# Patient Record
Sex: Female | Born: 1955
Health system: Southern US, Community
[De-identification: ages and names within clinical notes are randomized; demographics above are authoritative.]

## PROBLEM LIST (undated history)

## (undated) DIAGNOSIS — I10 Essential (primary) hypertension: Secondary | ICD-10-CM

## (undated) DIAGNOSIS — IMO0001 Reserved for inherently not codable concepts without codable children: Secondary | ICD-10-CM

## (undated) DIAGNOSIS — G8929 Other chronic pain: Secondary | ICD-10-CM

## (undated) DIAGNOSIS — Z464 Encounter for fitting and adjustment of orthodontic device: Secondary | ICD-10-CM

## (undated) DIAGNOSIS — M1990 Unspecified osteoarthritis, unspecified site: Secondary | ICD-10-CM

## (undated) DIAGNOSIS — E785 Hyperlipidemia, unspecified: Secondary | ICD-10-CM

## (undated) HISTORY — PX: VAGINAL HYSTERECTOMY: SUR661

## (undated) HISTORY — DX: Essential (primary) hypertension: I10

## (undated) HISTORY — DX: Hyperlipidemia, unspecified: E78.5

## (undated) HISTORY — PX: APPENDECTOMY: SHX54

---

## 1998-01-22 DIAGNOSIS — T7840XA Allergy, unspecified, initial encounter: Secondary | ICD-10-CM

## 1998-01-22 HISTORY — DX: Allergy, unspecified, initial encounter: T78.40XA

## 2004-04-16 ENCOUNTER — Ambulatory Visit: Payer: Self-pay | Admitting: Obstetrics and Gynecology

## 2005-05-20 ENCOUNTER — Ambulatory Visit: Payer: Self-pay | Admitting: Obstetrics and Gynecology

## 2006-05-15 ENCOUNTER — Ambulatory Visit: Payer: Self-pay | Admitting: Obstetrics and Gynecology

## 2007-04-01 ENCOUNTER — Ambulatory Visit: Payer: Self-pay | Admitting: Gastroenterology

## 2007-05-21 ENCOUNTER — Ambulatory Visit: Payer: Self-pay | Admitting: Unknown Physician Specialty

## 2008-02-25 ENCOUNTER — Ambulatory Visit: Payer: Self-pay | Admitting: Family Medicine

## 2008-07-04 ENCOUNTER — Ambulatory Visit: Payer: Self-pay | Admitting: Unknown Physician Specialty

## 2009-07-10 ENCOUNTER — Ambulatory Visit: Payer: Self-pay | Admitting: Unknown Physician Specialty

## 2010-10-22 ENCOUNTER — Ambulatory Visit: Payer: Self-pay | Admitting: Anesthesiology

## 2010-10-22 DIAGNOSIS — Z01810 Encounter for preprocedural cardiovascular examination: Secondary | ICD-10-CM

## 2010-10-25 ENCOUNTER — Ambulatory Visit: Payer: Self-pay | Admitting: Otolaryngology

## 2011-06-05 ENCOUNTER — Ambulatory Visit: Payer: Self-pay | Admitting: Family Medicine

## 2011-10-03 ENCOUNTER — Ambulatory Visit: Payer: Self-pay | Admitting: Otolaryngology

## 2012-07-20 ENCOUNTER — Ambulatory Visit: Payer: Self-pay | Admitting: Family Medicine

## 2013-07-28 ENCOUNTER — Ambulatory Visit: Payer: Self-pay | Admitting: Family Medicine

## 2013-08-21 HISTORY — PX: COLONOSCOPY: SHX174

## 2013-09-17 ENCOUNTER — Ambulatory Visit: Payer: Self-pay | Admitting: Gastroenterology

## 2013-09-17 LAB — HM COLONOSCOPY

## 2014-11-15 ENCOUNTER — Other Ambulatory Visit: Payer: Self-pay | Admitting: Family Medicine

## 2014-12-09 ENCOUNTER — Encounter: Payer: Self-pay | Admitting: Family Medicine

## 2014-12-09 ENCOUNTER — Ambulatory Visit (INDEPENDENT_AMBULATORY_CARE_PROVIDER_SITE_OTHER): Payer: BLUE CROSS/BLUE SHIELD | Admitting: Family Medicine

## 2014-12-09 VITALS — BP 124/80 | HR 64 | Temp 98.4°F | Ht 69.0 in | Wt 259.0 lb

## 2014-12-09 DIAGNOSIS — J0100 Acute maxillary sinusitis, unspecified: Secondary | ICD-10-CM

## 2014-12-09 MED ORDER — AZITHROMYCIN 250 MG PO TABS: ORAL_TABLET | ORAL | Status: DC

## 2014-12-09 NOTE — Progress Notes (Signed)
Name: Katrina Crawford   MRN: [id number]    DOB: 03/19/1955   Date:12/09/2014       Progress Note  Subjective  Chief Complaint  Chief Complaint  Patient presents with  . Sinusitis    sore throat, cough with Rudge production    Sinusitis This is a new problem. The current episode started in the past 7 days. The problem has been gradually worsening since onset. There has been no fever. The pain is mild. Associated symptoms include congestion, coughing, ear pain and sinus pressure. Pertinent negatives include no chills, diaphoresis, headaches, neck pain, shortness of breath, sneezing, sore throat or swollen glands. Past treatments include acetaminophen and oral decongestants. The treatment provided mild relief.    No problem-specific assessment & plan notes found for this encounter.   Past Medical History  Diagnosis Date  . Hypertension   . Hyperlipidemia     Past Surgical History  Procedure Laterality Date  . Appendectomy    . Vaginal hysterectomy    . Colonoscopy  08/2013    cleared for 5 yrs- Dr Servando SnareWohl    History reviewed. No pertinent family history.  Social History   Social History  . Marital Status: Married    Spouse Name: N/A  . Number of Children: N/A  . Years of Education: N/A   Occupational History  . Not on file.   Social History Main Topics  . Smoking status: Never Smoker   . Smokeless tobacco: Not on file  . Alcohol Use: No  . Drug Use: No  . Sexual Activity: Yes   Other Topics Concern  . Not on file   Social History Narrative  . No narrative on file    Allergies  Allergen Reactions  . Penicillins   . Sulfa Antibiotics      Review of Systems  Constitutional: Negative for fever, chills, weight loss, malaise/fatigue and diaphoresis.  HENT: Positive for congestion, ear pain and sinus pressure. Negative for ear discharge, sneezing and sore throat.   Eyes: Negative for blurred vision.  Respiratory: Positive for cough. Negative for sputum  production, shortness of breath and wheezing.   Cardiovascular: Negative for chest pain, palpitations and leg swelling.  Gastrointestinal: Negative for heartburn, nausea, abdominal pain, diarrhea, constipation, blood in stool and melena.  Genitourinary: Negative for dysuria, urgency, frequency and hematuria.  Musculoskeletal: Negative for myalgias, back pain, joint pain and neck pain.  Skin: Negative for rash.  Neurological: Negative for dizziness, tingling, sensory change, focal weakness and headaches.  Endo/Heme/Allergies: Negative for environmental allergies and polydipsia. Does not bruise/bleed easily.  Psychiatric/Behavioral: Negative for depression and suicidal ideas. The patient is not nervous/anxious and does not have insomnia.      Objective  Filed Vitals:   12/09/14 1336  BP: 124/80  Pulse: 64  Temp: 98.4 F (36.9 C)  TempSrc: Oral  Height: 5\' 9"  (1.753 m)  Weight: 259 lb (117.482 kg)    Physical Exam  Constitutional: She is well-developed, well-nourished, and in no distress. No distress.  HENT:  Head: Normocephalic and atraumatic.  Right Ear: External ear normal.  Left Ear: External ear normal.  Nose: Nose normal.  Mouth/Throat: Oropharynx is clear and moist.  Eyes: Conjunctivae and EOM are normal. Pupils are equal, round, and reactive to light. Right eye exhibits no discharge. Left eye exhibits no discharge.  Neck: Normal range of motion. Neck supple. No JVD present. No thyromegaly present.  Cardiovascular: Normal rate, regular rhythm, normal heart sounds and intact distal pulses.  Exam  reveals no gallop and no friction rub.   No murmur heard. Pulmonary/Chest: Effort normal and breath sounds normal.  Abdominal: Soft. Bowel sounds are normal. She exhibits no mass. There is no tenderness. There is no guarding.  Musculoskeletal: Normal range of motion. She exhibits no edema.  Lymphadenopathy:    She has no cervical adenopathy.  Neurological: She is alert. She has  normal reflexes.  Skin: Skin is warm and dry. She is not diaphoretic.  Psychiatric: Mood and affect normal.      Assessment & Plan  Problem List Items Addressed This Visit    None    Visit Diagnoses    Acute maxillary sinusitis, recurrence not specified    -  Primary    Relevant Medications    azithromycin (ZITHROMAX) 250 MG tablet         Dr. Hayden [facility]  12/09/2014

## 2014-12-23 ENCOUNTER — Ambulatory Visit (INDEPENDENT_AMBULATORY_CARE_PROVIDER_SITE_OTHER): Payer: BLUE CROSS/BLUE SHIELD | Admitting: Family Medicine

## 2014-12-23 ENCOUNTER — Encounter: Payer: Self-pay | Admitting: Family Medicine

## 2014-12-23 ENCOUNTER — Other Ambulatory Visit: Payer: Self-pay | Admitting: Family Medicine

## 2014-12-23 VITALS — BP 139/86 | HR 86 | Temp 98.6°F | Resp 16 | Ht 69.0 in | Wt 263.0 lb

## 2014-12-23 DIAGNOSIS — R350 Frequency of micturition: Secondary | ICD-10-CM

## 2014-12-23 DIAGNOSIS — N3001 Acute cystitis with hematuria: Secondary | ICD-10-CM | POA: Diagnosis not present

## 2014-12-23 LAB — POCT URINALYSIS DIPSTICK
Bilirubin, UA: NEGATIVE
GLUCOSE UA: NEGATIVE
Ketones, UA: NEGATIVE
NITRITE UA: POSITIVE
Spec Grav, UA: 1.01
UROBILINOGEN UA: 0.2
pH, UA: 6

## 2014-12-23 MED ORDER — PHENAZOPYRIDINE HCL 100 MG PO TABS: 100.0000 mg | ORAL_TABLET | Freq: Three times a day (TID) | ORAL | Status: DC | PRN

## 2014-12-23 MED ORDER — NITROFURANTOIN MONOHYD MACRO 100 MG PO CAPS: 100.0000 mg | ORAL_CAPSULE | Freq: Two times a day (BID) | ORAL | Status: DC

## 2014-12-23 NOTE — Patient Instructions (Addendum)
You are being treated for a urinary tract infection today. Please take your antibiotic as directed. If you develop severe flank pain, blood in the urine, fever, nausea or vomiting, please seek immediate medical attention in the ER.    Urinary Tract Infection Urinary tract infections (UTIs) can develop anywhere along your urinary tract. Your urinary tract is your body's drainage system for removing wastes and extra water. Your urinary tract includes two kidneys, two ureters, a bladder, and a urethra. Your kidneys are a pair of bean-shaped organs. Each kidney is about the size of your fist. They are located below your ribs, one on each side of your spine. CAUSES Infections are caused by microbes, which are microscopic organisms, including fungi, viruses, and bacteria. These organisms are so small that they can only be seen through a microscope. Bacteria are the microbes that most commonly cause UTIs. SYMPTOMS  Symptoms of UTIs may vary by age and gender of the patient and by the location of the infection. Symptoms in young women typically include a frequent and intense urge to urinate and a painful, burning feeling in the bladder or urethra during urination. Older women and men are more likely to be tired, shaky, and weak and have muscle aches and abdominal pain. A fever may mean the infection is in your kidneys. Other symptoms of a kidney infection include pain in your back or sides below the ribs, nausea, and vomiting. DIAGNOSIS To diagnose a UTI, your caregiver will ask you about your symptoms. Your caregiver will also ask you to provide a urine sample. The urine sample will be tested for bacteria and Hollenbach blood cells. Brendel blood cells are made by your body to help fight infection. TREATMENT  Typically, UTIs can be treated with medication. Because most UTIs are caused by a bacterial infection, they usually can be treated with the use of antibiotics. The choice of antibiotic and length of treatment  depend on your symptoms and the type of bacteria causing your infection. HOME CARE INSTRUCTIONS  If you were prescribed antibiotics, take them exactly as your caregiver instructs you. Finish the medication even if you feel better after you have only taken some of the medication.  Drink enough water and fluids to keep your urine clear or pale yellow.  Avoid caffeine, tea, and carbonated beverages. They tend to irritate your bladder.  Empty your bladder often. Avoid holding urine for long periods of time.  Empty your bladder before and after sexual intercourse.  After a bowel movement, women should cleanse from front to back. Use each tissue only once. SEEK MEDICAL CARE IF:   You have back pain.  You develop a fever.  Your symptoms do not begin to resolve within 3 days. SEEK IMMEDIATE MEDICAL CARE IF:   You have severe back pain or lower abdominal pain.  You develop chills.  You have nausea or vomiting.  You have continued burning or discomfort with urination. MAKE SURE YOU:   Understand these instructions.  Will watch your condition.  Will get help right away if you are not doing well or get worse.   This information is not intended to replace advice given to you by your health care provider. Make sure you discuss any questions you have with your health care provider.   Document Released: 10/17/2004 Document Revised: 09/28/2014 Document Reviewed: 02/15/2011 Elsevier Interactive Patient Education 2016 [employer]  

## 2014-12-23 NOTE — Progress Notes (Signed)
Subjective:    Patient ID: Katrina Crawford, female    DOB: 1955/06/13, 59 y.o.   MRN: [id number]  HPI: Katrina Crawford is a 59 y.o. female presenting on 12/23/2014 for Urinary Tract Infection   Urinary Tract Infection  This is a new problem. The current episode started yesterday. The problem occurs every urination. The problem has been unchanged. The quality of the pain is described as burning. The pain is mild. There has been no fever. There is no history of pyelonephritis. Associated symptoms include frequency and hesitancy. Pertinent negatives include no chills, discharge, flank pain, hematuria, nausea or possible pregnancy. She has tried nothing for the symptoms.    Pt presents for possible UTI. Symptoms began on Wednesday afternoon. Urgency. Sharp pain at end of void. Small amounts of urine. No fevers. No nausea or vomiting. No OTC medications tried.   Past Medical History  Diagnosis Date  . Hypertension   . Hyperlipidemia    Social History   Social History  . Marital Status: Married    Spouse Name: N/A  . Number of Children: N/A  . Years of Education: N/A   Occupational History  . Not on file.   Social History Main Topics  . Smoking status: Never Smoker   . Smokeless tobacco: Not on file  . Alcohol Use: No  . Drug Use: No  . Sexual Activity: Yes   Other Topics Concern  . Not on file   Social History Narrative  . No narrative on file   No family history on file. Current Outpatient Prescriptions on File Prior to Visit  Medication Sig  . hydrochlorothiazide (HYDRODIURIL) 25 MG tablet TAKE 1 TABLET DAILY  . simvastatin (ZOCOR) 40 MG tablet Take 1 tablet by mouth daily.   No current facility-administered medications on file prior to visit.    Review of Systems  Constitutional: Negative for fever and chills.  Respiratory: Negative for chest tightness and shortness of breath.   Cardiovascular: Negative for chest pain, palpitations and leg swelling.    Gastrointestinal: Negative for nausea.  Genitourinary: Positive for hesitancy and frequency. Negative for hematuria and flank pain.   Per HPI unless specifically indicated above     Objective:    BP 139/86 mmHg  Pulse 86  Temp(Src) 98.6 F (37 C) (Oral)  Resp 16  Ht  (1.753 m)  Wt 263 lb (119.296 kg)  BMI 38.82 kg/m2  Wt Readings from Last 3 Encounters:  12/23/14 263 lb (119.296 kg)  12/09/14 259 lb (117.482 kg)    Physical Exam  Constitutional: She appears well-developed and well-nourished. No distress.  HENT:  Head: Normocephalic and atraumatic.  Cardiovascular: Normal rate and regular rhythm.  Exam reveals no gallop and no friction rub.   No murmur heard. Pulmonary/Chest: Effort normal and breath sounds normal. She has no wheezes.  Abdominal: Soft. Normal appearance. There is no tenderness. There is no CVA tenderness.  Skin: She is not diaphoretic.   Results for orders placed or performed in visit on 12/23/14  POCT Urinalysis Dipstick  Result Value Ref Range   Color, UA YELLOW    Clarity, UA CLOUDY    Glucose, UA NEG    Bilirubin, UA NEG    Ketones, UA NEG    Spec Grav, UA 1.010    Blood, UA 3+    pH, UA 6.0    Protein, UA TRACE    Urobilinogen, UA 0.2    Nitrite, UA POS    Leukocytes, UA  moderate (2+) (A) Negative      Assessment & Plan:   Problem List Items Addressed This Visit    None    Visit Diagnoses    Acute cystitis with hematuria    -  Primary    UA +leuks, blood, nitrites. Treat with macrobid BID x7 days. Pyridum PRN. Alarm symptoms reviewed. Urine sent for culture.     Relevant Medications    nitrofurantoin, macrocrystal-monohydrate, (MACROBID) 100 MG capsule    phenazopyridine (PYRIDIUM) 100 MG tablet    Other Relevant Orders    POCT Urinalysis Dipstick (Completed)    Urine Culture       Meds ordered this encounter  Medications  . Estradiol (DIVIGEL) 0.5 MG/0.5GM GEL    Sig: Place onto the skin.  Marland Kitchen. nitrofurantoin,  macrocrystal-monohydrate, (MACROBID) 100 MG capsule    Sig: Take 1 capsule (100 mg total) by mouth 2 (two) times daily.    Dispense:  14 capsule    Refill:  0    Order Specific Question:  Supervising Provider    Answer:  Janeann ForehandHAWKINS JR, JAMES H [[id number]]  . phenazopyridine (PYRIDIUM) 100 MG tablet    Sig: Take 1 tablet (100 mg total) by mouth 3 (three) times daily as needed for pain.    Dispense:  10 tablet    Refill:  0    Order Specific Question:  Supervising Provider    Answer:  Janeann ForehandHAWKINS JR, JAMES H [[id number]]      Follow up plan: Return if symptoms worsen or fail to improve.

## 2014-12-25 LAB — URINE CULTURE

## 2015-02-17 ENCOUNTER — Other Ambulatory Visit: Payer: Self-pay | Admitting: Family Medicine

## 2015-03-23 ENCOUNTER — Other Ambulatory Visit: Payer: Self-pay | Admitting: Family Medicine

## 2015-03-24 ENCOUNTER — Encounter: Payer: Self-pay | Admitting: Family Medicine

## 2015-03-24 ENCOUNTER — Ambulatory Visit (INDEPENDENT_AMBULATORY_CARE_PROVIDER_SITE_OTHER): Payer: BLUE CROSS/BLUE SHIELD | Admitting: Family Medicine

## 2015-03-24 VITALS — BP 140/88 | HR 68 | Ht 69.0 in | Wt 267.0 lb

## 2015-03-24 DIAGNOSIS — E785 Hyperlipidemia, unspecified: Secondary | ICD-10-CM

## 2015-03-24 DIAGNOSIS — Z23 Encounter for immunization: Secondary | ICD-10-CM

## 2015-03-24 DIAGNOSIS — I10 Essential (primary) hypertension: Secondary | ICD-10-CM | POA: Diagnosis not present

## 2015-03-24 MED ORDER — HYDROCHLOROTHIAZIDE 25 MG PO TABS: 25.0000 mg | ORAL_TABLET | Freq: Every day | ORAL | Status: DC

## 2015-03-24 MED ORDER — SIMVASTATIN 40 MG PO TABS: 40.0000 mg | ORAL_TABLET | Freq: Every day | ORAL | Status: DC

## 2015-03-24 NOTE — Progress Notes (Signed)
Name: Katrina Crawford   MRN: [id number]    DOB: [birth year]   Date:03/24/2015       Progress Note  Subjective  Chief Complaint  Chief Complaint  Patient presents with  . Hypertension  . Hyperlipidemia    Hypertension This is a chronic problem. The current episode started more than 1 year ago. The problem has been gradually improving since onset. The problem is controlled. Pertinent negatives include no anxiety, blurred vision, chest pain, headaches, malaise/fatigue, neck pain, orthopnea, palpitations, peripheral edema, PND, shortness of breath or sweats. There are no associated agents to hypertension. Risk factors for coronary artery disease include diabetes mellitus and dyslipidemia. Past treatments include diuretics. The current treatment provides no improvement. There are no compliance problems.  There is no history of angina, kidney disease, CAD/MI, CVA, heart failure, left ventricular hypertrophy, PVD, renovascular disease or retinopathy. There is no history of chronic renal disease or a hypertension causing med.  Hyperlipidemia This is a chronic problem. The current episode started more than 1 year ago. The problem is controlled. She has no history of chronic renal disease, diabetes, hypothyroidism, liver disease, obesity or nephrotic syndrome. Pertinent negatives include no chest pain, focal sensory loss, focal weakness, leg pain, myalgias or shortness of breath. Current antihyperlipidemic treatment includes statins. The current treatment provides no improvement of lipids. There are no compliance problems.  Risk factors for coronary artery disease include dyslipidemia, hypertension and obesity.    No problem-specific assessment & plan notes found for this encounter.   Past Medical History  Diagnosis Date  . Hypertension   . Hyperlipidemia     Past Surgical History  Procedure Laterality Date  . Appendectomy    . Vaginal hysterectomy    . Colonoscopy  08/2013    cleared for 5 yrs- Dr  Servando Snare    Family History  Problem Relation Age of Onset  . Cancer Mother   . Hypertension Father     Social History   Social History  . Marital Status: Married    Spouse Name: N/A  . Number of Children: N/A  . Years of Education: N/A   Occupational History  . Not on file.   Social History Main Topics  . Smoking status: Never Smoker   . Smokeless tobacco: Not on file  . Alcohol Use: No  . Drug Use: No  . Sexual Activity: Yes   Other Topics Concern  . Not on file   Social History Narrative    Allergies  Allergen Reactions  . Penicillins   . Sulfa Antibiotics   . Codeine Nausea Only     Review of Systems  Constitutional: Negative for fever, chills, weight loss and malaise/fatigue.  HENT: Negative for ear discharge, ear pain and sore throat.   Eyes: Negative for blurred vision.  Respiratory: Negative for cough, sputum production, shortness of breath and wheezing.   Cardiovascular: Negative for chest pain, palpitations, orthopnea, leg swelling and PND.  Gastrointestinal: Negative for heartburn, nausea, abdominal pain, diarrhea, constipation, blood in stool and melena.  Genitourinary: Negative for dysuria, urgency, frequency and hematuria.  Musculoskeletal: Negative for myalgias, back pain, joint pain and neck pain.  Skin: Negative for rash.  Neurological: Negative for dizziness, tingling, sensory change, focal weakness and headaches.  Endo/Heme/Allergies: Negative for environmental allergies and polydipsia. Does not bruise/bleed easily.  Psychiatric/Behavioral: Negative for depression and suicidal ideas. The patient is not nervous/anxious and does not have insomnia.      Objective  Filed Vitals:   03/24/15 1033  BP: 140/88  Pulse: 68  Height: 5\' 9"  (1.753 m)  Weight: 267 lb (121.11 kg)    Physical Exam  Constitutional: She is well-developed, well-nourished, and in no distress. No distress.  HENT:  Head: Normocephalic and atraumatic.  Right Ear: External  ear normal.  Left Ear: External ear normal.  Nose: Nose normal.  Mouth/Throat: Oropharynx is clear and moist.  Eyes: Conjunctivae and EOM are normal. Pupils are equal, round, and reactive to light. Right eye exhibits no discharge. Left eye exhibits no discharge.  Neck: Normal range of motion. Neck supple. No JVD present. No thyromegaly present.  Cardiovascular: Normal rate, regular rhythm, normal heart sounds and intact distal pulses.  Exam reveals no gallop and no friction rub.   No murmur heard. Pulmonary/Chest: Effort normal and breath sounds normal.  Abdominal: Soft. Bowel sounds are normal. She exhibits no mass. There is no tenderness. There is no guarding.  Musculoskeletal: Normal range of motion. She exhibits no edema.  Lymphadenopathy:    She has no cervical adenopathy.  Neurological: She is alert.  Skin: Skin is warm and dry. She is not diaphoretic.  Psychiatric: Mood and affect normal.  Nursing note and vitals reviewed.     Assessment & Plan  Problem List Items Addressed This Visit    None    Visit Diagnoses    Essential hypertension    -  Primary    Relevant Medications    aspirin 81 MG tablet    hydrochlorothiazide (HYDRODIURIL) 25 MG tablet    simvastatin (ZOCOR) 40 MG tablet    Other Relevant Orders    Renal Function Panel    Hyperlipidemia        Relevant Medications    aspirin 81 MG tablet    hydrochlorothiazide (HYDRODIURIL) 25 MG tablet    simvastatin (ZOCOR) 40 MG tablet    Other Relevant Orders    Lipid Profile    Need for Tdap vaccination        Relevant Orders    Tdap vaccine greater than or equal to 7yo IM (Completed)         Dr. Hayden Rasmusseneanna Jones [facility]  03/24/2015

## 2015-03-25 LAB — RENAL FUNCTION PANEL
ALBUMIN: 4.5 g/dL (ref 3.6–4.8)
BUN/Creatinine Ratio: 15 (ref 11–26)
BUN: 10 mg/dL (ref 8–27)
CALCIUM: 10.2 mg/dL (ref 8.7–10.3)
CO2: 23 mmol/L (ref 18–29)
CREATININE: 0.67 mg/dL (ref 0.57–1.00)
Chloride: 98 mmol/L (ref 96–106)
GFR calc Af Amer: 110 mL/min/{1.73_m2} (ref >59)
GFR, EST NON AFRICAN AMERICAN: 95 mL/min/{1.73_m2} (ref >59)
Glucose: 99 mg/dL (ref 65–99)
PHOSPHORUS: 3.5 mg/dL (ref 2.5–4.5)
POTASSIUM: 4.2 mmol/L (ref 3.5–5.2)
Sodium: 141 mmol/L (ref 134–144)

## 2015-03-25 LAB — LIPID PANEL
CHOL/HDL RATIO: 4.4 ratio (ref 0.0–4.4)
CHOLESTEROL TOTAL: 221 mg/dL — AB (ref 100–199)
HDL: 50 mg/dL (ref >39)
LDL CALC: 112 mg/dL — AB (ref 0–99)
TRIGLYCERIDES: 297 mg/dL — AB (ref 0–149)
VLDL Cholesterol Cal: 59 mg/dL — ABNORMAL HIGH (ref 5–40)

## 2015-04-21 ENCOUNTER — Encounter: Payer: Self-pay | Admitting: Family Medicine

## 2015-04-21 ENCOUNTER — Ambulatory Visit (INDEPENDENT_AMBULATORY_CARE_PROVIDER_SITE_OTHER): Payer: BLUE CROSS/BLUE SHIELD | Admitting: Family Medicine

## 2015-04-21 VITALS — BP 110/82 | HR 78 | Ht 69.0 in | Wt 257.0 lb

## 2015-04-21 DIAGNOSIS — Z1239 Encounter for other screening for malignant neoplasm of breast: Secondary | ICD-10-CM

## 2015-04-21 DIAGNOSIS — Z0000 Encounter for general adult medical examination without abnormal findings: Secondary | ICD-10-CM

## 2015-04-21 DIAGNOSIS — Z124 Encounter for screening for malignant neoplasm of cervix: Secondary | ICD-10-CM | POA: Diagnosis not present

## 2015-04-21 NOTE — Patient Instructions (Signed)
GUIDELINES FOR  LOW-CHOLESTEROL, LOW-TRIGLYCERIDE DIETS    FOODS TO USE   MEATS, FISH Choose lean meats (chicken, turkey, veal, and non-fatty cuts of beef with excess fat trimmed; one serving = 3 oz of cooked meat). Also, fresh or frozen fish, canned fish packed in water, and shellfish (lobster, crabs, shrimp, and oysters). Limit use to no more than one serving of one of these per week. Shellfish are high in cholesterol but low in saturated fat and should be used sparingly. Meats and fish should be broiled (pan or oven) or baked on a rack.  EGGS Egg substitutes and egg whites (use freely). Egg yolks (limit two per week).  FRUITS Eat three servings of fresh fruit per day (1 serving =  cup). Be sure to have at least one citrus fruit daily. Frozen and canned fruit with no sugar or syrup added may be used.  VEGETABLES Most vegetables are not limited (see next page). One dark-green (string beans, escarole) or one deep yellow (squash) vegetable is recommended daily. Cauliflower, broccoli, and celery, as well as potato skins, are recommended for their fiber content. (Fiber is associated with cholesterol reduction) It is preferable to steam vegetables, but they may be boiled, strained, or braised with polyunsaturated vegetable oil (see below).  BEANS Dried peas or beans (1 serving =  cup) may be used as a bread substitute.  NUTS Almonds, walnuts, and peanuts may be used sparingly  (1 serving = 1 Tablespoonful). Use pumpkin, sesame, or sunflower seeds.  BREADS, GRAINS One roll or one slice of whole grain or enriched bread may be used, or three soda crackers or four pieces of melba toast as a substitute. Spaghetti, rice or noodles ( cup) or  large ear of corn may be used as a bread substitute. In preparing these foods do not use butter or shortening, use soft margarine. Also use egg and sugar substitutes.  Choose high fiber grains, such as oats and whole wheat.  CEREALS Use  cup of hot cereal or  cup of  cold cereal per day. Add a sugar substitute if desired, with 99% fat free or skim milk.  MILK PRODUCTS Always use 99% fat free or skim milk, dairy products such as low fat cheeses (farmer's uncreamed diet cottage), low-fat yogurt, and powdered skim milk.  FATS, OILS Use soft (not stick) margarine; vegetable oils that are high in polyunsaturated fats (such as safflower, sunflower, soybean, corn, and cottonseed). Always refrigerate meat drippings to harden the fat and remove it before preparing gravies  DESSERTS, SNACKS Limit to two servings per day; substitute each serving for a bread/cereal serving: ice milk, water sherbet (1/4 cup); unflavored gelatin or gelatin flavored with sugar substitute (1/3 cup); pudding prepared with skim milk (1/2 cup); egg Luten souffls; unbuttered popcorn (1  cups). Substitute carob for chocolate.  BEVERAGES Fresh fruit juices (limit 4 oz per day); black coffee, plain or herbal teas; soft drinks with sugar substitutes; club soda, preferably salt-free; cocoa made with skim milk or nonfat dried milk and water (sugar substitute added if desired); clear broth. Alcohol: limit two servings per day (see second page).  MISCELLANEOUS  You may use the following freely: vinegar, spices, herbs, nonfat bouillon, mustard, Worcestershire sauce, soy sauce, flavoring essence.                  GUIDELINES FOR  LOW-CHOLESTEROL, LOW TRIGLYCERIDE DIETS    FOODS TO AVOID   MEATS, FISH Marbled beef, pork, bacon, sausage, and other pork products; fatty   fowl (duck, goose); skin and fat of turkey and chicken; processed meats; luncheon meats (salami, bologna); frankfurters and fast-food hamburgers (theyre loaded with fat); organ meats (kidneys, liver); canned fish packed in oil.  EGGS Limit egg yolks to two per week.   FRUITS Coconuts (rich in saturated fats).  VEGETABLES Avoid avocados. Starchy vegetables (potatoes, corn, lima beans, dried peas, beans) may be used only if  substitutes for a serving of bread or cereal. (Baked potato skin, however, is desirable for its fiber content.  BEANS Commercial baked beans with sugar and/or pork added.  NUTS Avoid nuts.  Limit peanuts and walnuts to one tablespoonful per day.  BREADS, GRAINS Any baked goods with shortening and/or sugar. Commercial mixes with dried eggs and whole milk. Avoid sweet rolls, doughnuts, breakfast pastries (Danish), and sweetened packaged cereals (the added sugar converts readily to triglycerides).  MILK PRODUCTS Whole milk and whole-milk packaged goods; cream; ice cream; whole-milk puddings, yogurt, or cheeses; nondairy cream substitutes.  FATS, OILS Butter, lard, animal fats, bacon drippings, gravies, cream sauces as well as palm and coconut oils. All these are high in saturated fats. Examine labels on cholesterol free products for hydrogenated fats. (These are oils that have been hardened into solids and in the process have become saturated.)  DESSERTS, SNACKS Fried snack foods like potato chips; chocolate; candies in general; jams, jellies, syrups; whole- milk puddings; ice cream and milk sherbets; hydrogenated peanut butter.  BEVERAGES Sugared fruit juices and soft drinks; cocoa made with whole milk and/or sugar. When using alcohol (1 oz liquor, 5 oz beer, or 2  oz dry table wine per serving), one serving must be substituted for one bread or cereal serving (limit, two servings of alcohol per day).   SPECIAL NOTES    1. Remember that even non-limited foods should be used in moderation. 2. While on a cholesterol-lowering diet, be sure to avoid animal fats and marbled meats. 3. 3. While on a triglyceride-lowering diet, be sure to avoid sweets and to control the amount of carbohydrates you eat (starchy foods such as flour, bread, potatoes).While on a tri-glyceride-lowering diet, be sure to avoid sweets 4. Buy a good low-fat cookbook, such as the one published by the American Heart Association. 5. Consult  your physician if you have any questions.               [facility] Low Glycemic Diet Plan   Low Glycemic Foods (20-49) Moderate Glycemic Foods (50-69) High Glycemic Foods (70-100)      Breakfast Creals Breakfast Cereals Breakfast Cereals  All Bran All-Bran Fruit'n Oats   Bran Buds Bran Chex   Cheerios Corn chex    Fiber One Oatmeal (not instant)   Just Right Mini-Wheats   Corn Flakes Cream of Wheat    Oat Bran Special K Swiss Muesli   Grape Nuts Grape Nut Flakes      Grits Nutri-Grain    Fruits and fruit juice: Fruits Puffed Rice Puffed Wheat    (Limit to 1-2 Servings per day) Banana (under-ride) Dates   Rice Chex Rice Krispies    Apples Apricots (fresh/dried)   Figs Grapes   Shredded Wheat Team    Blackberries Blueberries   Kiwi Mango   Total     Cherries Cranberries   Oranges Raisins     Peaches Pears    Fruits  Plums Prunes   Fruit Juices Pineapple Watermelon    Grapefruit Raspberries   Cranberry Juice Orange Juice   Banana (over-ripe)       Strawberries Tangerines      Apple Juice Grapefruit Juice   Beans and Legumes Beverages  Tomato Juice    Boston-type baked beans Sodas, sweet tea, pineapple juice   Canned pinto, kidney, or navy beans   Beans and Legumes (fresh-cooked) Green peas Vegetables  Black-eyed peas Butter Beans    Potato, baked, boiled, fried, mashed  Chick peas Lentils   Vegetables French fries  Green beans Lima beans   Beets Carrots   Canned or frozen corn  Kidney beans Navy beans   Sweet potato Yam   Parsnips  Pinto beans Snow peas   Corn on the cob Winter squash      Non-starchy vegetables Grains Breads  Asparagus, avocado, broccoli, cabbage Cornmeal Rice, brown   Most breads (Aziz and whole grain)  cauliflower, celery, cucumber, greens Rice, Duzan Couscous   Bagels Bread sticks    lettuce, mushrooms, peppers, tomatoes  Bread stuffing Kaiser roll    okra, onions, spinach, summer squash Pasta Dinner rolls    Macaroni Pizza, cheese     Grains Ravioli, meat filled Spaghetti, Marquard   Grains  Barley Bulgur    Rice, instant Tapioca, with milk    Rye Wild rice   Nuts    Cashews Macadamia   Candy and most cookies  Nuts and oils    Almonds, peanuts, sunflower seeds Snacks Snacks  hazelnuts, pecans, walnuts Chocolate Ice cream, lowfat   Donuts Corn chips    Oils that are liquid at room temperature Muffin Popcorn   Jelly beans Pretzels      Pastries  Dairy, fish, meat, soy, and eggs    Milk, skim Lowfat cheese    Restaurant and ethnic foods  Yogurt, lowfat, fruit sugar sweetened  Most Chinese food (sugar in stir fry    or wok sauce)  Lean red meat Fish    Teriyaki-style meats and vegetables  Skinless chicken and turkey, shellfish        Egg whites (up to 3 daily), Soy Products    Egg yolks (up to 7 or _____ per week)      

## 2015-04-21 NOTE — Progress Notes (Signed)
Name: Durwin RegesYvonne R Arakawa   MRN: [id number]    DOB: 08/22/1955   Date:04/21/2015       Progress Note  Subjective  Chief Complaint  Chief Complaint  Patient presents with  . Annual Exam    pap and breast mammo    HPI Comments: Patient presents for annual physical exam.    No problem-specific assessment & plan notes found for this encounter.   Past Medical History  Diagnosis Date  . Hypertension   . Hyperlipidemia     Past Surgical History  Procedure Laterality Date  . Appendectomy    . Vaginal hysterectomy    . Colonoscopy  08/2013    cleared for 5 yrs- Dr Servando SnareWohl    Family History  Problem Relation Age of Onset  . Cancer Mother   . Hypertension Father     Social History   Social History  . Marital Status: Married    Spouse Name: N/A  . Number of Children: N/A  . Years of Education: N/A   Occupational History  . Not on file.   Social History Main Topics  . Smoking status: Never Smoker   . Smokeless tobacco: Not on file  . Alcohol Use: No  . Drug Use: No  . Sexual Activity: Yes   Other Topics Concern  . Not on file   Social History Narrative    Allergies  Allergen Reactions  . Penicillins   . Sulfa Antibiotics   . Codeine Nausea Only     Review of Systems  Constitutional: Negative for fever, chills, weight loss and malaise/fatigue.  HENT: Negative for ear discharge, ear pain and sore throat.   Eyes: Negative for blurred vision.  Respiratory: Negative for cough, sputum production, shortness of breath and wheezing.   Cardiovascular: Negative for chest pain, palpitations and leg swelling.  Gastrointestinal: Negative for heartburn, nausea, abdominal pain, diarrhea, constipation, blood in stool and melena.  Genitourinary: Negative for dysuria, urgency, frequency and hematuria.  Musculoskeletal: Negative for myalgias, back pain, joint pain and neck pain.  Skin: Negative for rash.  Neurological: Negative for dizziness, tingling, sensory change, focal  weakness and headaches.  Endo/Heme/Allergies: Negative for environmental allergies and polydipsia. Does not bruise/bleed easily.  Psychiatric/Behavioral: Negative for depression and suicidal ideas. The patient is not nervous/anxious and does not have insomnia.      Objective  Filed Vitals:   04/21/15 1013  BP: 110/82  Pulse: 78  Height: 5\' 9"  (1.753 m)  Weight: 257 lb (116.574 kg)    Physical Exam  Constitutional: She is well-developed, well-nourished, and in no distress. No distress.  HENT:  Head: Normocephalic and atraumatic.  Right Ear: External ear normal.  Left Ear: External ear normal.  Nose: Nose normal.  Mouth/Throat: Oropharynx is clear and moist.  Eyes: Conjunctivae and EOM are normal. Pupils are equal, round, and reactive to light. Right eye exhibits no discharge. Left eye exhibits no discharge.  Neck: Normal range of motion. Neck supple. No JVD present. No thyromegaly present.  Cardiovascular: Normal rate, regular rhythm, normal heart sounds and intact distal pulses.  Exam reveals no gallop and no friction rub.   No murmur heard. Pulmonary/Chest: Effort normal and breath sounds normal. Right breast exhibits no inverted nipple, no mass, no nipple discharge, no skin change and no tenderness. Left breast exhibits no inverted nipple, no mass, no nipple discharge, no skin change and no tenderness. Breasts are symmetrical.  Abdominal: Soft. Bowel sounds are normal. She exhibits no mass. There is no tenderness.  There is no guarding.  Genitourinary: Rectum normal, vagina normal, right adnexa normal, left adnexa normal and vulva normal. Guaiac negative stool.  Musculoskeletal: Normal range of motion. She exhibits no edema.  Lymphadenopathy:    She has no cervical adenopathy.  Neurological: She is alert.  Skin: Skin is warm and dry. She is not diaphoretic.  Psychiatric: Mood and affect normal.  Nursing note and vitals reviewed.     Assessment & Plan  Problem List Items  Addressed This Visit    None    Visit Diagnoses    Annual physical exam    -  Primary    Relevant Orders    Pap IG (Image Guided)    MM Digital Screening    Cervical cancer screening        Relevant Orders    Pap IG (Image Guided)    Breast cancer screening        Relevant Orders    MM Digital Screening         Dr. Elizabeth Sauer [facility]  04/21/2015

## 2015-04-24 ENCOUNTER — Other Ambulatory Visit: Payer: Self-pay | Admitting: Family Medicine

## 2015-04-24 ENCOUNTER — Ambulatory Visit
Admission: RE | Admit: 2015-04-24 | Discharge: 2015-04-24 | Disposition: A | Discharge status: Home / Self Care | Payer: BLUE CROSS/BLUE SHIELD | Source: Ambulatory Visit | Attending: Family Medicine | Admitting: Family Medicine

## 2015-04-24 DIAGNOSIS — Z0000 Encounter for general adult medical examination without abnormal findings: Secondary | ICD-10-CM

## 2015-04-24 DIAGNOSIS — Z1231 Encounter for screening mammogram for malignant neoplasm of breast: Secondary | ICD-10-CM | POA: Insufficient documentation

## 2015-04-24 DIAGNOSIS — Z1239 Encounter for other screening for malignant neoplasm of breast: Secondary | ICD-10-CM

## 2015-04-24 DIAGNOSIS — E782 Mixed hyperlipidemia: Secondary | ICD-10-CM

## 2015-04-24 DIAGNOSIS — R928 Other abnormal and inconclusive findings on diagnostic imaging of breast: Secondary | ICD-10-CM

## 2015-04-26 LAB — PAP IG (IMAGE GUIDED): PAP Smear Comment: 0

## 2015-05-01 ENCOUNTER — Ambulatory Visit
Admission: RE | Admit: 2015-05-01 | Discharge: 2015-05-01 | Disposition: A | Discharge status: Home / Self Care | Payer: BLUE CROSS/BLUE SHIELD | Source: Ambulatory Visit | Attending: Family Medicine | Admitting: Family Medicine

## 2015-05-01 DIAGNOSIS — R928 Other abnormal and inconclusive findings on diagnostic imaging of breast: Secondary | ICD-10-CM | POA: Insufficient documentation

## 2015-05-01 DIAGNOSIS — R922 Inconclusive mammogram: Secondary | ICD-10-CM | POA: Diagnosis not present

## 2015-05-31 DIAGNOSIS — Z789 Other specified health status: Secondary | ICD-10-CM | POA: Diagnosis not present

## 2015-05-31 DIAGNOSIS — L821 Other seborrheic keratosis: Secondary | ICD-10-CM | POA: Diagnosis not present

## 2015-05-31 DIAGNOSIS — D2339 Other benign neoplasm of skin of other parts of face: Secondary | ICD-10-CM | POA: Diagnosis not present

## 2015-05-31 DIAGNOSIS — Z872 Personal history of diseases of the skin and subcutaneous tissue: Secondary | ICD-10-CM | POA: Diagnosis not present

## 2015-05-31 DIAGNOSIS — Z1283 Encounter for screening for malignant neoplasm of skin: Secondary | ICD-10-CM | POA: Diagnosis not present

## 2015-05-31 DIAGNOSIS — B078 Other viral warts: Secondary | ICD-10-CM | POA: Diagnosis not present

## 2015-09-19 ENCOUNTER — Other Ambulatory Visit: Payer: Self-pay | Admitting: Family Medicine

## 2015-09-19 DIAGNOSIS — E785 Hyperlipidemia, unspecified: Secondary | ICD-10-CM

## 2015-09-28 ENCOUNTER — Ambulatory Visit: Payer: Self-pay | Admitting: Family Medicine

## 2015-10-06 ENCOUNTER — Ambulatory Visit (INDEPENDENT_AMBULATORY_CARE_PROVIDER_SITE_OTHER): Payer: BLUE CROSS/BLUE SHIELD | Admitting: Family Medicine

## 2015-10-06 ENCOUNTER — Encounter: Payer: Self-pay | Admitting: Family Medicine

## 2015-10-06 VITALS — BP 130/80 | HR 78 | Ht 69.0 in | Wt 245.0 lb

## 2015-10-06 DIAGNOSIS — E669 Obesity, unspecified: Secondary | ICD-10-CM | POA: Diagnosis not present

## 2015-10-06 DIAGNOSIS — I10 Essential (primary) hypertension: Secondary | ICD-10-CM | POA: Diagnosis not present

## 2015-10-06 DIAGNOSIS — Z23 Encounter for immunization: Secondary | ICD-10-CM | POA: Diagnosis not present

## 2015-10-06 DIAGNOSIS — E785 Hyperlipidemia, unspecified: Secondary | ICD-10-CM | POA: Diagnosis not present

## 2015-10-06 MED ORDER — SIMVASTATIN 40 MG PO TABS: 40.0000 mg | ORAL_TABLET | Freq: Every day | ORAL | 1 refills | Status: DC

## 2015-10-06 MED ORDER — HYDROCHLOROTHIAZIDE 25 MG PO TABS: 25.0000 mg | ORAL_TABLET | Freq: Every day | ORAL | 1 refills | Status: DC

## 2015-10-06 NOTE — Progress Notes (Signed)
Name: Katrina Crawford   MRN: [id number]    DOB: 05/29/1955   Date:10/06/2015       Progress Note  Subjective  Chief Complaint  Chief Complaint  Patient presents with  . Hypertension  . Hyperlipidemia    Hypertension  This is a chronic problem. The current episode started more than 1 year ago. The problem has been gradually improving since onset. The problem is controlled. Pertinent negatives include no anxiety, blurred vision, chest pain, headaches, malaise/fatigue, neck pain, orthopnea, palpitations, peripheral edema, PND, shortness of breath or sweats. There are no associated agents to hypertension. There are no known risk factors for coronary artery disease. Past treatments include diuretics. The current treatment provides moderate improvement. There are no compliance problems.  There is no history of angina, kidney disease, CAD/MI, CVA, heart failure, left ventricular hypertrophy, PVD, renovascular disease or retinopathy. There is no history of chronic renal disease or a hypertension causing med.  Hyperlipidemia  This is a chronic problem. The current episode started more than 1 year ago. The problem is controlled. Recent lipid tests were reviewed and are normal. Exacerbating diseases include obesity. She has no history of chronic renal disease, diabetes, hypothyroidism or liver disease. There are no known factors aggravating her hyperlipidemia. Pertinent negatives include no chest pain, focal weakness, myalgias or shortness of breath. She is currently on no antihyperlipidemic treatment. The current treatment provides moderate improvement of lipids. There are no compliance problems.  There are no known risk factors for coronary artery disease.    No problem-specific Assessment & Plan notes found for this encounter.   Past Medical History:  Diagnosis Date  . Hyperlipidemia   . Hypertension     Past Surgical History:  Procedure Laterality Date  . APPENDECTOMY    . COLONOSCOPY  08/2013   cleared for 5 yrs- Dr Servando SnareWohl  . VAGINAL HYSTERECTOMY      Family History  Problem Relation Age of Onset  . Cancer Mother   . Hypertension Father     Social History   Social History  . Marital status: Married    Spouse name: N/A  . Number of children: N/A  . Years of education: N/A   Occupational History  . Not on file.   Social History Main Topics  . Smoking status: Never Smoker  . Smokeless tobacco: Not on file  . Alcohol use No  . Drug use: No  . Sexual activity: Yes   Other Topics Concern  . Not on file   Social History Narrative  . No narrative on file    Allergies  Allergen Reactions  . Penicillins   . Sulfa Antibiotics   . Codeine Nausea Only     Review of Systems  Constitutional: Negative for chills, fever, malaise/fatigue and weight loss.  HENT: Negative for ear discharge, ear pain and sore throat.   Eyes: Negative for blurred vision and redness.  Respiratory: Negative for cough, sputum production, shortness of breath and wheezing.   Cardiovascular: Negative for chest pain, palpitations, orthopnea, leg swelling and PND.  Gastrointestinal: Negative for abdominal pain, blood in stool, constipation, diarrhea, heartburn, melena and nausea.  Genitourinary: Negative for dysuria, frequency, hematuria and urgency.  Musculoskeletal: Negative for back pain, joint pain, myalgias and neck pain.  Skin: Negative for rash.  Neurological: Negative for dizziness, tingling, sensory change, focal weakness and headaches.  Endo/Heme/Allergies: Negative for environmental allergies and polydipsia. Does not bruise/bleed easily.  Psychiatric/Behavioral: Negative for depression and suicidal ideas. The patient is not  nervous/anxious and does not have insomnia.      Objective  Vitals:   10/06/15 0924  BP: 130/80  Pulse: 78  Weight: 245 lb (111.1 kg)  Height: 5\' 9"  (1.753 m)    Physical Exam  Constitutional: She is well-developed, well-nourished, and in no distress. No  distress.  HENT:  Head: Normocephalic and atraumatic.  Right Ear: External ear normal.  Left Ear: External ear normal.  Nose: Nose normal.  Mouth/Throat: Oropharynx is clear and moist.  Eyes: Conjunctivae and EOM are normal. Pupils are equal, round, and reactive to light. Right eye exhibits no discharge. Left eye exhibits no discharge.  Neck: Normal range of motion. Neck supple. No JVD present. No thyromegaly present.  Cardiovascular: Normal rate, regular rhythm, normal heart sounds and intact distal pulses.  Exam reveals no gallop and no friction rub.   No murmur heard. Pulmonary/Chest: Effort normal and breath sounds normal.  Abdominal: Soft. Bowel sounds are normal. She exhibits no mass. There is no tenderness. There is no guarding.  Musculoskeletal: Normal range of motion. She exhibits no edema.  Lymphadenopathy:    She has no cervical adenopathy.  Neurological: She is alert. She has normal reflexes.  Skin: Skin is warm and dry. She is not diaphoretic.  Psychiatric: Mood and affect normal.  Nursing note and vitals reviewed.     Assessment & Plan  Problem List Items Addressed This Visit    None    Visit Diagnoses    Flu vaccine need    -  Primary   Relevant Orders   Flu Vaccine QUAD 36+ mos PF IM (Fluarix & Fluzone Quad PF) (Completed)   Essential hypertension       Relevant Medications   hydrochlorothiazide (HYDRODIURIL) 25 MG tablet   simvastatin (ZOCOR) 40 MG tablet   Other Relevant Orders   Renal Function Panel   Hyperlipidemia       Relevant Medications   hydrochlorothiazide (HYDRODIURIL) 25 MG tablet   simvastatin (ZOCOR) 40 MG tablet   Other Relevant Orders   Lipid Profile   Hepatic function panel   Obesity            Dr. Elizabeth Sauer [facility]  10/06/15

## 2015-10-06 NOTE — Patient Instructions (Signed)
Why follow it? Research shows. . Those who follow the Mediterranean diet have a reduced risk of heart disease  . The diet is associated with a reduced incidence of Parkinson's and Alzheimer's diseases . People following the diet may have longer life expectancies and lower rates of chronic diseases  . The Dietary Guidelines for Americans recommends the Mediterranean diet as an eating plan to promote health and prevent disease  What Is the Mediterranean Diet?  . Healthy eating plan based on typical foods and recipes of Mediterranean-style cooking . The diet is primarily a plant based diet; these foods should make up a majority of meals   Starches - Plant based foods should make up a majority of meals - They are an important sources of vitamins, minerals, energy, antioxidants, and fiber - Choose whole grains, foods high in fiber and minimally processed items  - Typical grain sources include wheat, oats, barley, corn, brown rice, bulgar, farro, millet, polenta, couscous  - Various types of beans include chickpeas, lentils, fava beans, black beans, Sorenson beans   Fruits  Veggies - Large quantities of antioxidant rich fruits & veggies; 6 or more servings  - Vegetables can be eaten raw or lightly drizzled with oil and cooked  - Vegetables common to the traditional Mediterranean Diet include: artichokes, arugula, beets, broccoli, brussel sprouts, cabbage, carrots, celery, collard greens, cucumbers, eggplant, kale, leeks, lemons, lettuce, mushrooms, okra, onions, peas, peppers, potatoes, pumpkin, radishes, rutabaga, shallots, spinach, sweet potatoes, turnips, zucchini - Fruits common to the Mediterranean Diet include: apples, apricots, avocados, cherries, clementines, dates, figs, grapefruits, grapes, melons, nectarines, oranges, peaches, pears, pomegranates, strawberries, tangerines  Fats - Replace butter and margarine with healthy oils, such as olive oil, canola oil, and tahini  - Limit nuts to no  more than a handful a day  - Nuts include walnuts, almonds, pecans, pistachios, pine nuts  - Limit or avoid candied, honey roasted or heavily salted nuts - Olives are central to the Mediterranean diet - can be eaten whole or used in a variety of dishes   Meats Protein - Limiting red meat: no more than a few times a month - When eating red meat: choose lean cuts and keep the portion to the size of deck of cards - Eggs: approx. 0 to 4 times a week  - Fish and lean poultry: at least 2 a week  - Healthy protein sources include, chicken, turkey, lean beef, lamb - Increase intake of seafood such as tuna, salmon, trout, mackerel, shrimp, scallops - Avoid or limit high fat processed meats such as sausage and bacon  Dairy - Include moderate amounts of low fat dairy products  - Focus on healthy dairy such as fat free yogurt, skim milk, low or reduced fat cheese - Limit dairy products higher in fat such as whole or 2% milk, cheese, ice cream  Alcohol - Moderate amounts of red wine is ok  - No more than 5 oz daily for women (all ages) and men older than age 65  - No more than 10 oz of wine daily for men younger than 65  Other - Limit sweets and other desserts  - Use herbs and spices instead of salt to flavor foods  - Herbs and spices common to the traditional Mediterranean Diet include: basil, bay leaves, chives, cloves, cumin, fennel, garlic, lavender, marjoram, mint, oregano, parsley, pepper, rosemary, sage, savory, sumac, tarragon, thyme   It's not just a diet, it's a lifestyle:  . The Mediterranean diet includes   lifestyle factors typical of those in the region  . Foods, drinks and meals are best eaten with others and savored . Daily physical activity is important for overall good health . This could be strenuous exercise like running and aerobics . This could also be more leisurely activities such as walking, housework, yard-work, or taking the stairs . Moderation is the key; a balanced and  healthy diet accommodates most foods and drinks . Consider portion sizes and frequency of consumption of certain foods   Meal Ideas & Options:  . Breakfast:  o Whole wheat toast or whole wheat English muffins with peanut butter & hard boiled egg o Steel cut oats topped with apples & cinnamon and skim milk  o Fresh fruit: banana, strawberries, melon, berries, peaches  o Smoothies: strawberries, bananas, greek yogurt, peanut butter o Low fat greek yogurt with blueberries and granola  o Egg Baldridge omelet with spinach and mushrooms o Breakfast couscous: whole wheat couscous, apricots, skim milk, cranberries  . Sandwiches:  o Hummus and grilled vegetables (peppers, zucchini, squash) on whole wheat bread   o Grilled chicken on whole wheat pita with lettuce, tomatoes, cucumbers or tzatziki  o Tuna salad on whole wheat bread: tuna salad made with greek yogurt, olives, red peppers, capers, green onions o Garlic rosemary lamb pita: lamb sauted with garlic, rosemary, salt & pepper; add lettuce, cucumber, greek yogurt to pita - flavor with lemon juice and black pepper  . Seafood:  o Mediterranean grilled salmon, seasoned with garlic, basil, parsley, lemon juice and black pepper o Shrimp, lemon, and spinach whole-grain pasta salad made with low fat greek yogurt  o Seared scallops with lemon orzo  o Seared tuna steaks seasoned salt, pepper, coriander topped with tomato mixture of olives, tomatoes, olive oil, minced garlic, parsley, green onions and cappers  . Meats:  o Herbed greek chicken salad with kalamata olives, cucumber, feta  o Red bell peppers stuffed with spinach, bulgur, lean ground beef (or lentils) & topped with feta   o Kebabs: skewers of chicken, tomatoes, onions, zucchini, squash  o Turkey burgers: made with red onions, mint, dill, lemon juice, feta cheese topped with roasted red peppers . Vegetarian o Cucumber salad: cucumbers, artichoke hearts, celery, red onion, feta cheese, tossed in  olive oil & lemon juice  o Hummus and whole grain pita points with a greek salad (lettuce, tomato, feta, olives, cucumbers, red onion) o Lentil soup with celery, carrots made with vegetable broth, garlic, salt and pepper  o Tabouli salad: parsley, bulgur, mint, scallions, cucumbers, tomato, radishes, lemon juice, olive oil, salt and pepper.      

## 2015-10-07 LAB — RENAL FUNCTION PANEL
ALBUMIN: 4.4 g/dL (ref 3.6–4.8)
BUN/Creatinine Ratio: 18 (ref 12–28)
BUN: 13 mg/dL (ref 8–27)
CHLORIDE: 100 mmol/L (ref 96–106)
CO2: 25 mmol/L (ref 18–29)
Calcium: 9.9 mg/dL (ref 8.7–10.3)
Creatinine, Ser: 0.74 mg/dL (ref 0.57–1.00)
GFR, EST AFRICAN AMERICAN: 100 mL/min/{1.73_m2} (ref >59)
GFR, EST NON AFRICAN AMERICAN: 87 mL/min/{1.73_m2} (ref >59)
Glucose: 91 mg/dL (ref 65–99)
PHOSPHORUS: 3.5 mg/dL (ref 2.5–4.5)
POTASSIUM: 4.3 mmol/L (ref 3.5–5.2)
Sodium: 142 mmol/L (ref 134–144)

## 2015-10-07 LAB — LIPID PANEL
Chol/HDL Ratio: 4.2 ratio units (ref 0.0–4.4)
Cholesterol, Total: 197 mg/dL (ref 100–199)
HDL: 47 mg/dL (ref >39)
LDL Calculated: 114 mg/dL — ABNORMAL HIGH (ref 0–99)
TRIGLYCERIDES: 180 mg/dL — AB (ref 0–149)
VLDL CHOLESTEROL CAL: 36 mg/dL (ref 5–40)

## 2015-10-07 LAB — HEPATIC FUNCTION PANEL
ALK PHOS: 61 IU/L (ref 39–117)
ALT: 14 IU/L (ref 0–32)
AST: 15 IU/L (ref 0–40)
Bilirubin Total: 0.7 mg/dL (ref 0.0–1.2)
Bilirubin, Direct: 0.15 mg/dL (ref 0.00–0.40)
Total Protein: 6.9 g/dL (ref 6.0–8.5)

## 2015-10-18 ENCOUNTER — Ambulatory Visit (INDEPENDENT_AMBULATORY_CARE_PROVIDER_SITE_OTHER): Payer: BLUE CROSS/BLUE SHIELD | Admitting: Family Medicine

## 2015-10-18 ENCOUNTER — Encounter: Payer: Self-pay | Admitting: Family Medicine

## 2015-10-18 VITALS — BP 120/80 | HR 80 | Temp 100.0°F | Ht 69.0 in | Wt 245.0 lb

## 2015-10-18 DIAGNOSIS — N3001 Acute cystitis with hematuria: Secondary | ICD-10-CM

## 2015-10-18 LAB — POCT URINALYSIS DIPSTICK
Bilirubin, UA: NEGATIVE
Glucose, UA: NEGATIVE
KETONES UA: NEGATIVE
Nitrite, UA: NEGATIVE
PH UA: 5
PROTEIN UA: NEGATIVE
SPEC GRAV UA: 1.01
UROBILINOGEN UA: 0.2

## 2015-10-18 MED ORDER — CIPROFLOXACIN HCL 250 MG PO TABS: 250.0000 mg | ORAL_TABLET | Freq: Two times a day (BID) | ORAL | 0 refills | Status: DC

## 2015-10-18 NOTE — Progress Notes (Signed)
Name: Katrina Crawford   MRN: [id number] [id number]    DOB: 06/26/1955   Date:10/18/2015       Progress Note  Subjective  Chief Complaint  Chief Complaint  Patient presents with  . Urinary Tract Infection    hurting/pressure when urinating. Feels like got to go constantly    Urinary Tract Infection   This is a new problem. The current episode started yesterday. The problem has been gradually worsening. The quality of the pain is described as burning and stabbing. The pain is at a severity of 5/10. The pain is moderate. The maximum temperature recorded prior to her arrival was 100 - 100.9 F. There is no history of pyelonephritis. Associated symptoms include chills, frequency and urgency. Pertinent negatives include no discharge, flank pain, hematuria, hesitancy, nausea, sweats or vomiting. She has tried nothing for the symptoms. The treatment provided no relief.    No problem-specific Assessment & Plan notes found for this encounter.   Past Medical History:  Diagnosis Date  . Hyperlipidemia   . Hypertension     Past Surgical History:  Procedure Laterality Date  . APPENDECTOMY    . COLONOSCOPY  08/2013   cleared for 5 yrs- Dr Servando SnareWohl  . VAGINAL HYSTERECTOMY      Family History  Problem Relation Age of Onset  . Cancer Mother   . Hypertension Father     Social History   Social History  . Marital status: Married    Spouse name: N/A  . Number of children: N/A  . Years of education: N/A   Occupational History  . Not on file.   Social History Main Topics  . Smoking status: Never Smoker  . Smokeless tobacco: Not on file  . Alcohol use No  . Drug use: No  . Sexual activity: Yes   Other Topics Concern  . Not on file   Social History Narrative  . No narrative on file    Allergies  Allergen Reactions  . Penicillins   . Sulfa Antibiotics   . Codeine Nausea Only     Review of Systems  Constitutional: Positive for chills. Negative for fever, malaise/fatigue and weight loss.   HENT: Negative for ear discharge, ear pain and sore throat.   Eyes: Negative for blurred vision.  Respiratory: Negative for cough, sputum production, shortness of breath and wheezing.   Cardiovascular: Negative for chest pain, palpitations and leg swelling.  Gastrointestinal: Negative for abdominal pain, blood in stool, constipation, diarrhea, heartburn, melena, nausea and vomiting.  Genitourinary: Positive for frequency and urgency. Negative for dysuria, flank pain, hematuria and hesitancy.  Musculoskeletal: Negative for back pain, joint pain, myalgias and neck pain.  Skin: Negative for rash.  Neurological: Negative for dizziness, tingling, sensory change, focal weakness and headaches.  Endo/Heme/Allergies: Negative for environmental allergies and polydipsia. Does not bruise/bleed easily.  Psychiatric/Behavioral: Negative for depression and suicidal ideas. The patient is not nervous/anxious and does not have insomnia.      Objective  Vitals:   10/18/15 0827  BP: 120/80  Pulse: 80  Temp: 100 F (37.8 C)  Weight: 245 lb (111.1 kg)  Height: 5\' 9"  (1.753 m)    Physical Exam  Constitutional: She is well-developed, well-nourished, and in no distress. No distress.  HENT:  Head: Normocephalic and atraumatic.  Right Ear: External ear normal.  Left Ear: External ear normal.  Nose: Nose normal.  Mouth/Throat: Oropharynx is clear and moist.  Eyes: Conjunctivae and EOM are normal. Pupils are equal, round, and reactive to  light. Right eye exhibits no discharge. Left eye exhibits no discharge.  Neck: Normal range of motion. Neck supple. No JVD present. No thyromegaly present.  Cardiovascular: Normal rate, regular rhythm, normal heart sounds and intact distal pulses.  Exam reveals no gallop and no friction rub.   No murmur heard. Pulmonary/Chest: Effort normal and breath sounds normal.  Abdominal: Soft. Bowel sounds are normal. She exhibits no mass. There is no hepatosplenomegaly. There is  tenderness in the suprapubic area. There is no rigidity, no rebound, no guarding and no CVA tenderness.  Musculoskeletal: Normal range of motion. She exhibits no edema.  Lymphadenopathy:    She has no cervical adenopathy.  Neurological: She is alert.  Skin: Skin is warm and dry. She is not diaphoretic.  Psychiatric: Mood and affect normal.  Nursing note and vitals reviewed.     Assessment & Plan  Problem List Items Addressed This Visit    None    Visit Diagnoses    Acute cystitis with hematuria    -  Primary   Relevant Medications   ciprofloxacin (CIPRO) 250 MG tablet   Other Relevant Orders   POCT urinalysis dipstick (Completed)        Dr. [facility]  10/18/15

## 2016-05-02 ENCOUNTER — Ambulatory Visit (INDEPENDENT_AMBULATORY_CARE_PROVIDER_SITE_OTHER): Payer: BLUE CROSS/BLUE SHIELD | Admitting: Family Medicine

## 2016-05-02 VITALS — BP 120/70 | HR 64 | Ht 69.0 in | Wt 240.0 lb

## 2016-05-02 DIAGNOSIS — I10 Essential (primary) hypertension: Secondary | ICD-10-CM

## 2016-05-02 DIAGNOSIS — Z1159 Encounter for screening for other viral diseases: Secondary | ICD-10-CM | POA: Diagnosis not present

## 2016-05-02 DIAGNOSIS — B009 Herpesviral infection, unspecified: Secondary | ICD-10-CM | POA: Diagnosis not present

## 2016-05-02 DIAGNOSIS — E782 Mixed hyperlipidemia: Secondary | ICD-10-CM | POA: Diagnosis not present

## 2016-05-02 MED ORDER — L-LYSINE 1000 MG PO TABS: 2.0000 | ORAL_TABLET | Freq: Every day | ORAL | 3 refills | Status: DC

## 2016-05-02 MED ORDER — HYDROCHLOROTHIAZIDE 25 MG PO TABS: 25.0000 mg | ORAL_TABLET | Freq: Every day | ORAL | 1 refills | Status: DC

## 2016-05-02 MED ORDER — SIMVASTATIN 40 MG PO TABS: 40.0000 mg | ORAL_TABLET | Freq: Every day | ORAL | 1 refills | Status: DC

## 2016-05-02 MED ORDER — VALACYCLOVIR HCL 1 G PO TABS: 1000.0000 mg | ORAL_TABLET | Freq: Two times a day (BID) | ORAL | 0 refills | Status: DC

## 2016-05-02 NOTE — Progress Notes (Signed)
Name: Katrina Crawford   MRN: [id number]    DOB: Jan 21, 1956   Date:05/02/2016       Progress Note  Subjective  Chief Complaint  Chief Complaint  Patient presents with  . Hyperlipidemia  . Hypertension  . Genital Warts    takes Valacyclovir as needed- needs new RX    Hyperlipidemia  This is a chronic problem. The current episode started more than 1 year ago. The problem is controlled. Recent lipid tests were reviewed and are normal. She has no history of chronic renal disease, diabetes, hypothyroidism, liver disease, obesity or nephrotic syndrome. There are no known factors aggravating her hyperlipidemia. Pertinent negatives include no chest pain, focal sensory loss, focal weakness, leg pain, myalgias or shortness of breath. Current antihyperlipidemic treatment includes statins. The current treatment provides mild improvement of lipids. There are no compliance problems.  Risk factors for coronary artery disease include dyslipidemia and hypertension.  Hypertension  This is a chronic problem. The current episode started more than 1 year ago. The problem has been waxing and waning since onset. The problem is controlled. Pertinent negatives include no anxiety, blurred vision, chest pain, headaches, malaise/fatigue, neck pain, orthopnea, palpitations, peripheral edema, PND, shortness of breath or sweats. There are no associated agents to hypertension. Risk factors for coronary artery disease include diabetes mellitus and dyslipidemia. Past treatments include diuretics. The current treatment provides moderate improvement. There are no compliance problems.  There is no history of angina, kidney disease, CAD/MI, CVA, heart failure, left ventricular hypertrophy, PVD or retinopathy. There is no history of chronic renal disease, a hypertension causing med or renovascular disease.    No problem-specific Assessment & Plan notes found for this encounter.   Past Medical History:  Diagnosis Date  .  Hyperlipidemia   . Hypertension     Past Surgical History:  Procedure Laterality Date  . APPENDECTOMY    . COLONOSCOPY  08/2013   cleared for 5 yrs- Dr Servando Snare  . VAGINAL HYSTERECTOMY      Family History  Problem Relation Age of Onset  . Cancer Mother   . Hypertension Father     Social History   Social History  . Marital status: Married    Spouse name: N/A  . Number of children: N/A  . Years of education: N/A   Occupational History  . Not on file.   Social History Main Topics  . Smoking status: Never Smoker  . Smokeless tobacco: Not on file  . Alcohol use No  . Drug use: No  . Sexual activity: Yes   Other Topics Concern  . Not on file   Social History Narrative  . No narrative on file    Allergies  Allergen Reactions  . Penicillins   . Sulfa Antibiotics   . Codeine Nausea Only    Outpatient Medications Prior to Visit  Medication Sig Dispense Refill  . aspirin 81 MG tablet Take 81 mg by mouth daily.    . hydrochlorothiazide (HYDRODIURIL) 25 MG tablet Take 1 tablet (25 mg total) by mouth daily. 90 tablet 1  . L-Lysine 1000 MG TABS Take 2 tablets by mouth daily. otc    . simvastatin (ZOCOR) 40 MG tablet Take 1 tablet (40 mg total) by mouth daily. 90 tablet 1  . ciprofloxacin (CIPRO) 250 MG tablet Take 1 tablet (250 mg total) by mouth 2 (two) times daily. 6 tablet 0   No facility-administered medications prior to visit.     Review of Systems  Constitutional: Negative  for chills, fever, malaise/fatigue and weight loss.  HENT: Negative for ear discharge, ear pain and sore throat.   Eyes: Negative for blurred vision.  Respiratory: Negative for cough, sputum production, shortness of breath and wheezing.   Cardiovascular: Negative for chest pain, palpitations, orthopnea, leg swelling and PND.  Gastrointestinal: Negative for abdominal pain, blood in stool, constipation, diarrhea, heartburn, melena and nausea.  Genitourinary: Negative for dysuria, frequency,  hematuria and urgency.  Musculoskeletal: Negative for back pain, joint pain, myalgias and neck pain.  Skin: Negative for rash.  Neurological: Negative for dizziness, tingling, sensory change, focal weakness and headaches.  Endo/Heme/Allergies: Negative for environmental allergies and polydipsia. Does not bruise/bleed easily.  Psychiatric/Behavioral: Negative for depression and suicidal ideas. The patient is not nervous/anxious and does not have insomnia.      Objective  Vitals:   05/02/16 0919  BP: 120/70  Pulse: 64  Weight: 240 lb (108.9 kg)  Height:  (1.753 m)    Physical Exam  Constitutional: She is well-developed, well-nourished, and in no distress. No distress.  HENT:  Head: Normocephalic and atraumatic.  Right Ear: External ear normal.  Left Ear: External ear normal.  Nose: Nose normal.  Mouth/Throat: Oropharynx is clear and moist.  Eyes: Conjunctivae and EOM are normal. Pupils are equal, round, and reactive to light. Right eye exhibits no discharge. Left eye exhibits no discharge.  Neck: Normal range of motion. Neck supple. No JVD present. No thyromegaly present.  Cardiovascular: Normal rate, regular rhythm, normal heart sounds and intact distal pulses.  Exam reveals no gallop and no friction rub.   No murmur heard. Pulmonary/Chest: Effort normal and breath sounds normal. She has no wheezes. She has no rales.  Abdominal: Soft. Bowel sounds are normal. She exhibits no mass. There is no tenderness. There is no guarding.  Musculoskeletal: Normal range of motion. She exhibits no edema.  Lymphadenopathy:    She has no cervical adenopathy.  Neurological: She is alert.  Skin: Skin is warm and dry. She is not diaphoretic.  Psychiatric: Mood and affect normal.  Nursing note and vitals reviewed.     Assessment & Plan  Problem List Items Addressed This Visit      Cardiovascular and Mediastinum   Essential hypertension - Primary   Relevant Medications   simvastatin  (ZOCOR) 40 MG tablet   hydrochlorothiazide (HYDRODIURIL) 25 MG tablet   Other Relevant Orders   Renal Function Panel     Other   Mixed hyperlipidemia   Relevant Medications   simvastatin (ZOCOR) 40 MG tablet   hydrochlorothiazide (HYDRODIURIL) 25 MG tablet   Other Relevant Orders   Lipid Profile   HSV-1 infection   Relevant Medications   L-Lysine 1000 MG TABS   valACYclovir (VALTREX) 1000 MG tablet    Other Visit Diagnoses    Need for hepatitis C screening test       Relevant Orders   Hepatitis C antibody      Meds ordered this encounter  Medications  . simvastatin (ZOCOR) 40 MG tablet    Sig: Take 1 tablet (40 mg total) by mouth daily.    Dispense:  90 tablet    Refill:  1  . hydrochlorothiazide (HYDRODIURIL) 25 MG tablet    Sig: Take 1 tablet (25 mg total) by mouth daily.    Dispense:  90 tablet    Refill:  1  . L-Lysine 1000 MG TABS    Sig: Take 2 tablets (2,000 mg total) by mouth daily. otc  Dispense:  100 tablet    Refill:  3  . valACYclovir (VALTREX) 1000 MG tablet    Sig: Take 1 tablet (1,000 mg total) by mouth 2 (two) times daily. As needed for breakout    Dispense:  20 tablet    Refill:  0      Dr. Elizabeth Sauer [facility]  05/02/16

## 2016-05-03 LAB — RENAL FUNCTION PANEL
ALBUMIN: 4.5 g/dL (ref 3.6–4.8)
BUN/Creatinine Ratio: 21 (ref 12–28)
BUN: 14 mg/dL (ref 8–27)
CALCIUM: 9.9 mg/dL (ref 8.7–10.3)
CO2: 26 mmol/L (ref 18–29)
CREATININE: 0.68 mg/dL (ref 0.57–1.00)
Chloride: 103 mmol/L (ref 96–106)
GFR calc Af Amer: 108 mL/min/{1.73_m2} (ref >59)
GFR, EST NON AFRICAN AMERICAN: 94 mL/min/{1.73_m2} (ref >59)
Glucose: 88 mg/dL (ref 65–99)
PHOSPHORUS: 3.8 mg/dL (ref 2.5–4.5)
Potassium: 4.6 mmol/L (ref 3.5–5.2)
SODIUM: 145 mmol/L — AB (ref 134–144)

## 2016-05-03 LAB — HEPATITIS C ANTIBODY: Hep C Virus Ab: <0.1 s/co ratio (ref 0.0–0.9)

## 2016-05-03 LAB — LIPID PANEL
CHOL/HDL RATIO: 3.7 ratio (ref 0.0–4.4)
CHOLESTEROL TOTAL: 204 mg/dL — AB (ref 100–199)
HDL: 55 mg/dL (ref >39)
LDL CALC: 121 mg/dL — AB (ref 0–99)
TRIGLYCERIDES: 142 mg/dL (ref 0–149)
VLDL CHOLESTEROL CAL: 28 mg/dL (ref 5–40)

## 2016-05-29 DIAGNOSIS — H25813 Combined forms of age-related cataract, bilateral: Secondary | ICD-10-CM | POA: Diagnosis not present

## 2016-05-31 ENCOUNTER — Other Ambulatory Visit: Payer: Self-pay | Admitting: Family Medicine

## 2016-05-31 DIAGNOSIS — Z1231 Encounter for screening mammogram for malignant neoplasm of breast: Secondary | ICD-10-CM

## 2016-06-03 ENCOUNTER — Ambulatory Visit
Admission: RE | Admit: 2016-06-03 | Discharge: 2016-06-03 | Disposition: A | Discharge status: Home / Self Care | Payer: BLUE CROSS/BLUE SHIELD | Source: Ambulatory Visit | Attending: Family Medicine | Admitting: Family Medicine

## 2016-06-03 DIAGNOSIS — L570 Actinic keratosis: Secondary | ICD-10-CM | POA: Diagnosis not present

## 2016-06-03 DIAGNOSIS — Z1231 Encounter for screening mammogram for malignant neoplasm of breast: Secondary | ICD-10-CM | POA: Diagnosis not present

## 2016-06-03 DIAGNOSIS — L918 Other hypertrophic disorders of the skin: Secondary | ICD-10-CM | POA: Diagnosis not present

## 2016-06-03 DIAGNOSIS — Z86018 Personal history of other benign neoplasm: Secondary | ICD-10-CM | POA: Diagnosis not present

## 2016-10-25 ENCOUNTER — Ambulatory Visit (INDEPENDENT_AMBULATORY_CARE_PROVIDER_SITE_OTHER): Payer: BLUE CROSS/BLUE SHIELD | Admitting: Family Medicine

## 2016-10-25 ENCOUNTER — Encounter: Payer: Self-pay | Admitting: Family Medicine

## 2016-10-25 VITALS — BP 130/78 | HR 72 | Ht 69.0 in | Wt 237.0 lb

## 2016-10-25 DIAGNOSIS — E782 Mixed hyperlipidemia: Secondary | ICD-10-CM

## 2016-10-25 DIAGNOSIS — I10 Essential (primary) hypertension: Secondary | ICD-10-CM

## 2016-10-25 DIAGNOSIS — Z114 Encounter for screening for human immunodeficiency virus [HIV]: Secondary | ICD-10-CM

## 2016-10-25 MED ORDER — SIMVASTATIN 40 MG PO TABS: 40.0000 mg | ORAL_TABLET | Freq: Every day | ORAL | 2 refills | Status: DC

## 2016-10-25 MED ORDER — HYDROCHLOROTHIAZIDE 25 MG PO TABS: 25.0000 mg | ORAL_TABLET | Freq: Every day | ORAL | 2 refills | Status: DC

## 2016-10-25 NOTE — Progress Notes (Signed)
Name: Katrina Crawford   MRN: [id number]    DOB: 1955-08-02   Date:10/25/2016       Progress Note  Subjective  Chief Complaint  Chief Complaint  Patient presents with  . Hypertension  . Hyperlipidemia  . herpes outbreaks    Hypertension  This is a chronic problem. The current episode started more than 1 year ago. The problem has been waxing and waning since onset. The problem is controlled. Pertinent negatives include no anxiety, blurred vision, chest pain, headaches, malaise/fatigue, neck pain, orthopnea, palpitations, peripheral edema, PND, shortness of breath or sweats. There are no associated agents to hypertension. There are no known risk factors for coronary artery disease. Past treatments include diuretics. The current treatment provides moderate improvement. There are no compliance problems.  There is no history of angina, kidney disease, CAD/MI, CVA, heart failure, left ventricular hypertrophy, PVD or retinopathy. There is no history of chronic renal disease, a hypertension causing med or renovascular disease.  Hyperlipidemia  This is a chronic problem. The current episode started more than 1 year ago. The problem is controlled. Recent lipid tests were reviewed and are normal. She has no history of chronic renal disease. There are no known factors aggravating her hyperlipidemia. Pertinent negatives include no chest pain, focal weakness, myalgias or shortness of breath. Current antihyperlipidemic treatment includes statins. The current treatment provides moderate improvement of lipids. There are no compliance problems.  Risk factors for coronary artery disease include dyslipidemia and hypertension.    No problem-specific Assessment & Plan notes found for this encounter.   Past Medical History:  Diagnosis Date  . Hyperlipidemia   . Hypertension     Past Surgical History:  Procedure Laterality Date  . APPENDECTOMY    . COLONOSCOPY  08/2013   cleared for 5 yrs- Dr Servando Snare  . VAGINAL  HYSTERECTOMY      Family History  Problem Relation Age of Onset  . Cancer Mother   . Hypertension Father   . Breast cancer Neg Hx     Social History   Social History  . Marital status: Married    Spouse name: N/A  . Number of children: N/A  . Years of education: N/A   Occupational History  . Not on file.   Social History Main Topics  . Smoking status: Never Smoker  . Smokeless tobacco: Never Used  . Alcohol use No  . Drug use: No  . Sexual activity: Yes   Other Topics Concern  . Not on file   Social History Narrative  . No narrative on file    Allergies  Allergen Reactions  . Penicillins   . Sulfa Antibiotics   . Codeine Nausea Only    Outpatient Medications Prior to Visit  Medication Sig Dispense Refill  . aspirin 81 MG tablet Take 81 mg by mouth daily.    Marland Kitchen L-Lysine 1000 MG TABS Take 2 tablets (2,000 mg total) by mouth daily. otc 100 tablet 3  . valACYclovir (VALTREX) 1000 MG tablet Take 1 tablet (1,000 mg total) by mouth 2 (two) times daily. As needed for breakout 20 tablet 0  . hydrochlorothiazide (HYDRODIURIL) 25 MG tablet Take 1 tablet (25 mg total) by mouth daily. 90 tablet 1  . simvastatin (ZOCOR) 40 MG tablet Take 1 tablet (40 mg total) by mouth daily. 90 tablet 1   No facility-administered medications prior to visit.     Review of Systems  Constitutional: Negative for chills, fever, malaise/fatigue and weight loss.  HENT: Negative  for ear discharge, ear pain and sore throat.   Eyes: Negative for blurred vision.  Respiratory: Negative for cough, sputum production, shortness of breath and wheezing.   Cardiovascular: Negative for chest pain, palpitations, orthopnea, leg swelling and PND.  Gastrointestinal: Negative for abdominal pain, blood in stool, constipation, diarrhea, heartburn, melena and nausea.  Genitourinary: Negative for dysuria, frequency, hematuria and urgency.  Musculoskeletal: Negative for back pain, joint pain, myalgias and neck  pain.  Skin: Negative for rash.  Neurological: Negative for dizziness, tingling, sensory change, focal weakness and headaches.  Endo/Heme/Allergies: Negative for environmental allergies and polydipsia. Does not bruise/bleed easily.  Psychiatric/Behavioral: Negative for depression and suicidal ideas. The patient is not nervous/anxious and does not have insomnia.      Objective  Vitals:   10/25/16 1017  BP: 130/78  Pulse: 72  Weight: 237 lb (107.5 kg)  Height:  (1.753 m)    Physical Exam  Constitutional: She is well-developed, well-nourished, and in no distress. No distress.  HENT:  Head: Normocephalic and atraumatic.  Right Ear: External ear normal.  Left Ear: External ear normal.  Nose: Nose normal.  Mouth/Throat: Oropharynx is clear and moist.  Eyes: Pupils are equal, round, and reactive to light. Conjunctivae and EOM are normal. Right eye exhibits no discharge. Left eye exhibits no discharge.  Neck: Normal range of motion. Neck supple. No JVD present. No thyromegaly present.  Cardiovascular: Normal rate, regular rhythm, normal heart sounds and intact distal pulses.  Exam reveals no gallop and no friction rub.   No murmur heard. Pulmonary/Chest: Effort normal and breath sounds normal. She has no wheezes. She has no rales.  Abdominal: Soft. Bowel sounds are normal. She exhibits no mass. There is no tenderness. There is no guarding.  Musculoskeletal: Normal range of motion. She exhibits no edema or tenderness.  Lymphadenopathy:    She has no cervical adenopathy.  Neurological: She is alert. She has normal reflexes.  Skin: Skin is warm and dry. She is not diaphoretic.  Psychiatric: Mood and affect normal.  Nursing note and vitals reviewed.     Assessment & Plan  Problem List Items Addressed This Visit      Cardiovascular and Mediastinum   Essential hypertension - Primary   Relevant Medications   hydrochlorothiazide (HYDRODIURIL) 25 MG tablet   simvastatin (ZOCOR)  40 MG tablet   Other Relevant Orders   Renal Function Panel     Other   Mixed hyperlipidemia   Relevant Medications   hydrochlorothiazide (HYDRODIURIL) 25 MG tablet   simvastatin (ZOCOR) 40 MG tablet   Other Relevant Orders   Lipid Profile    Other Visit Diagnoses    Encounter for screening for HIV       Relevant Orders   HIV antibody      Meds ordered this encounter  Medications  . hydrochlorothiazide (HYDRODIURIL) 25 MG tablet    Sig: Take 1 tablet (25 mg total) by mouth daily.    Dispense:  90 tablet    Refill:  2  . simvastatin (ZOCOR) 40 MG tablet    Sig: Take 1 tablet (40 mg total) by mouth daily.    Dispense:  90 tablet    Refill:  2      Dr. Hayden [facility]  10/25/16

## 2016-10-26 LAB — HIV ANTIBODY (ROUTINE TESTING W REFLEX): HIV SCREEN 4TH GENERATION: NONREACTIVE

## 2016-10-26 LAB — RENAL FUNCTION PANEL
Albumin: 4.5 g/dL (ref 3.6–4.8)
BUN / CREAT RATIO: 20 (ref 12–28)
BUN: 16 mg/dL (ref 8–27)
CO2: 26 mmol/L (ref 20–29)
CREATININE: 0.79 mg/dL (ref 0.57–1.00)
Calcium: 10 mg/dL (ref 8.7–10.3)
Chloride: 102 mmol/L (ref 96–106)
GFR calc Af Amer: 92 mL/min/{1.73_m2} (ref >59)
GFR, EST NON AFRICAN AMERICAN: 80 mL/min/{1.73_m2} (ref >59)
Glucose: 98 mg/dL (ref 65–99)
Phosphorus: 3.4 mg/dL (ref 2.5–4.5)
Potassium: 4.6 mmol/L (ref 3.5–5.2)
Sodium: 143 mmol/L (ref 134–144)

## 2016-10-26 LAB — LIPID PANEL
CHOL/HDL RATIO: 3.6 ratio (ref 0.0–4.4)
Cholesterol, Total: 197 mg/dL (ref 100–199)
HDL: 54 mg/dL (ref >39)
LDL CALC: 110 mg/dL — AB (ref 0–99)
Triglycerides: 164 mg/dL — ABNORMAL HIGH (ref 0–149)
VLDL CHOLESTEROL CAL: 33 mg/dL (ref 5–40)

## 2016-11-20 DIAGNOSIS — L570 Actinic keratosis: Secondary | ICD-10-CM | POA: Diagnosis not present

## 2016-11-20 DIAGNOSIS — L821 Other seborrheic keratosis: Secondary | ICD-10-CM | POA: Diagnosis not present

## 2016-11-20 DIAGNOSIS — K130 Diseases of lips: Secondary | ICD-10-CM | POA: Diagnosis not present

## 2016-12-10 DIAGNOSIS — K130 Diseases of lips: Secondary | ICD-10-CM | POA: Diagnosis not present

## 2017-01-02 IMAGING — MG MM DIGITAL DIAGNOSTIC UNILAT*L* W/ TOMO W/ CAD
8 of 17 series · 8 of 40 positions shown · non-contrast
Comparison: Previous exam(s).

CLINICAL DATA: 59-year-old female recalled from screening mammogram
for possible left breast asymmetry.

EXAM:
2D DIGITAL DIAGNOSTIC UNILATERAL LEFT MAMMOGRAM WITH CAD AND ADJUNCT
TOMO

[L MLO (1 of 2)]
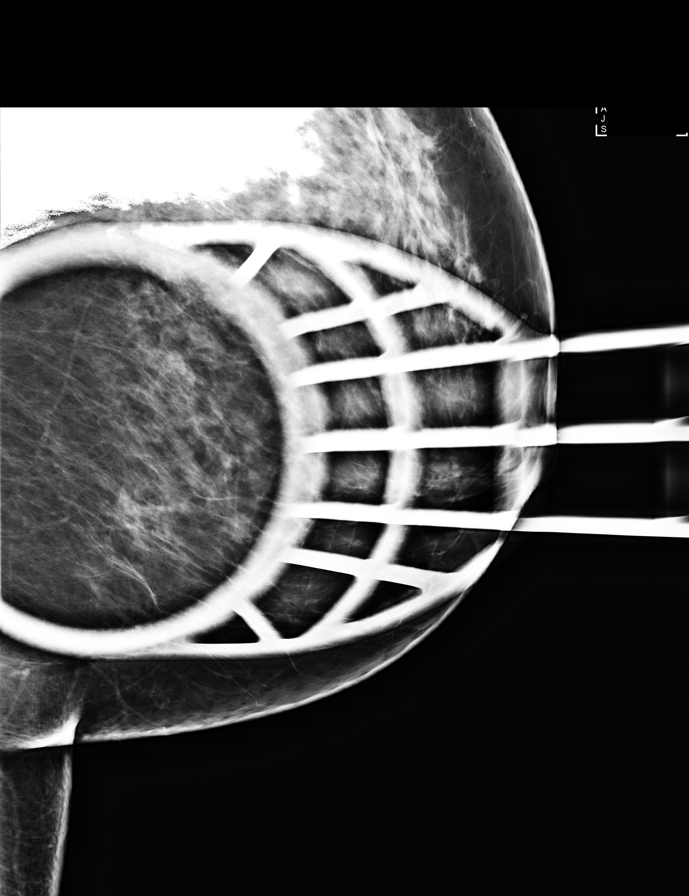

[L MLO (2 of 2)]
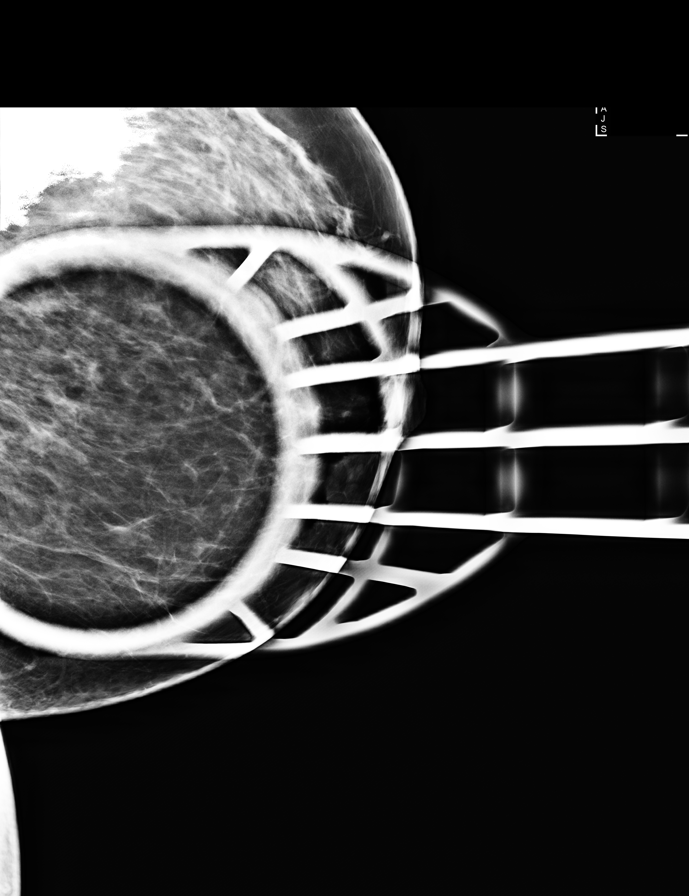

[L ML]
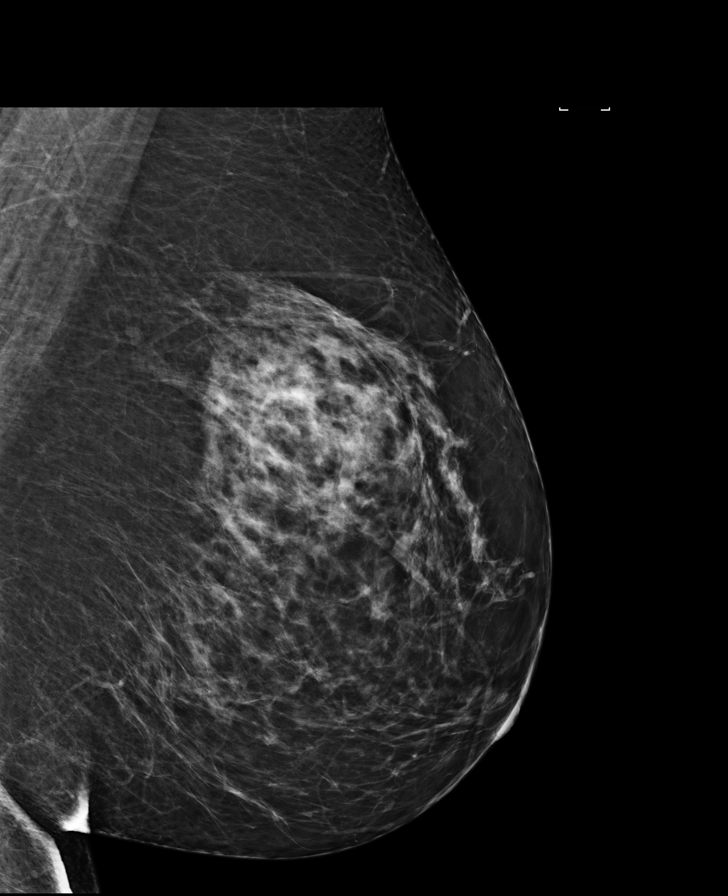

[L CC]
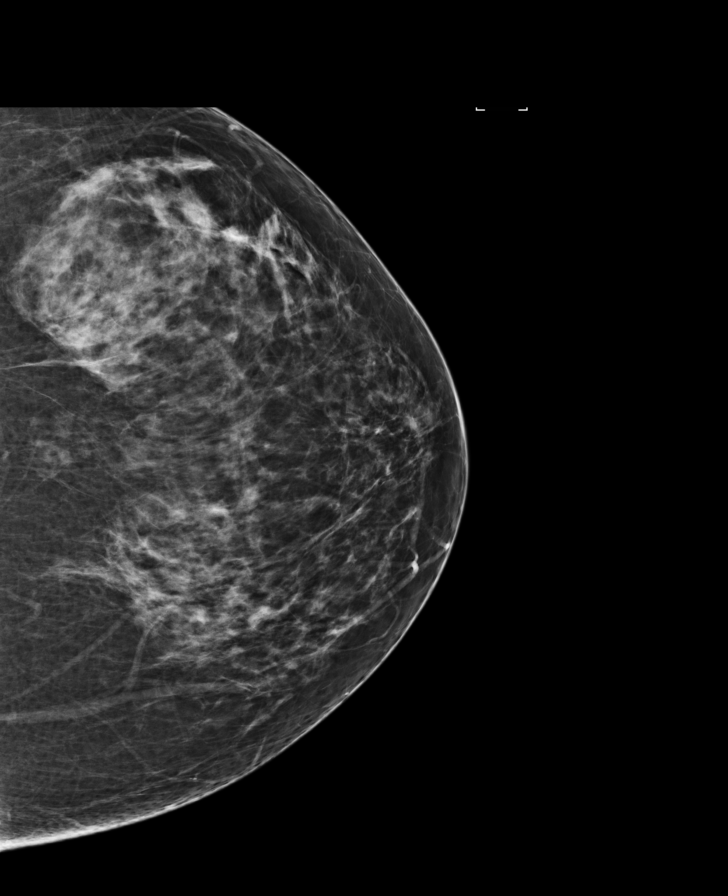

[L MLO synth-2D (1 of 3)]
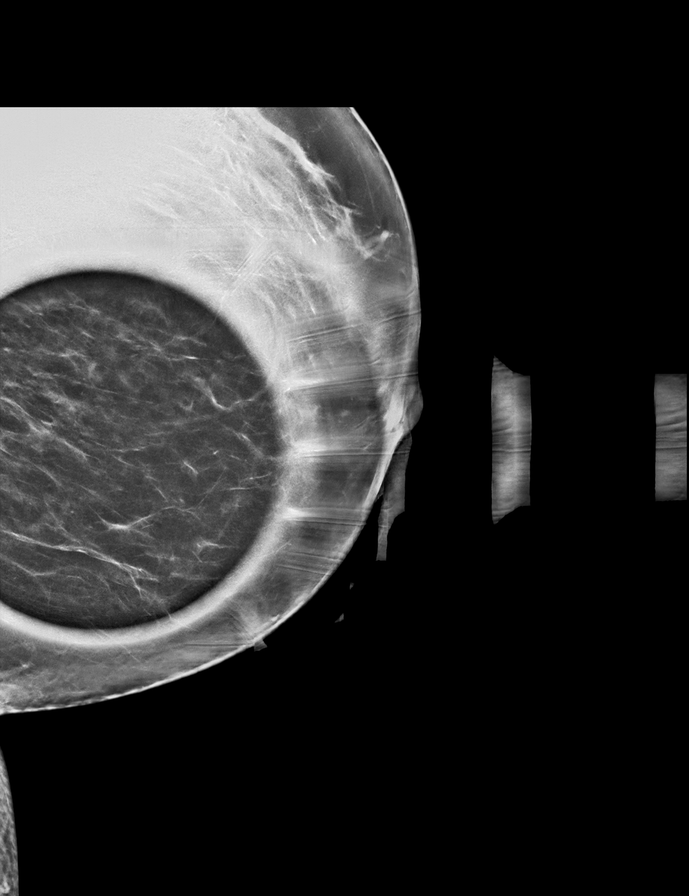

[L CC synth-2D]
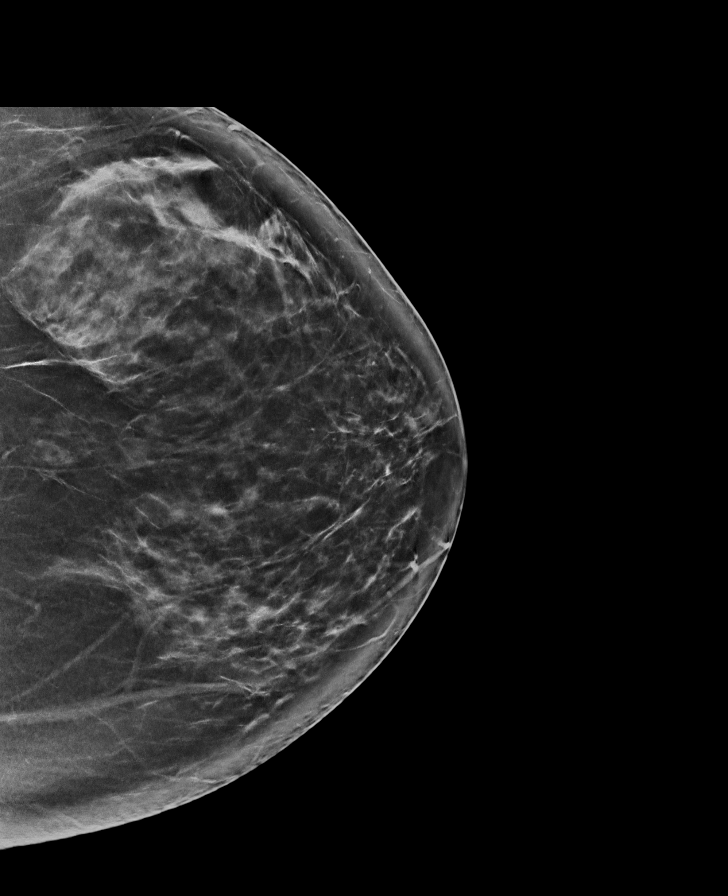

[L MLO synth-2D (2 of 3)]
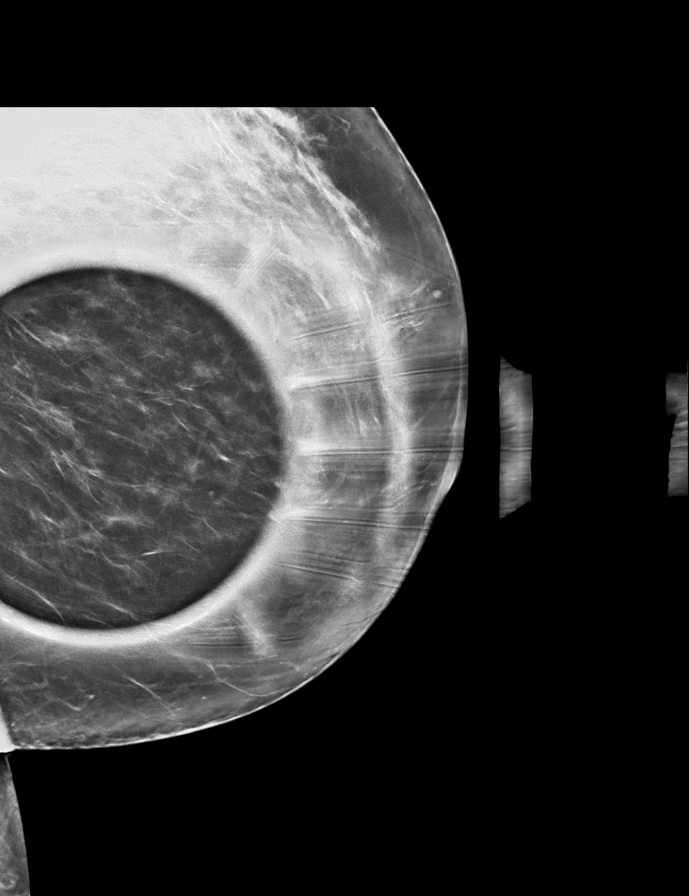

[L MLO synth-2D (3 of 3)]
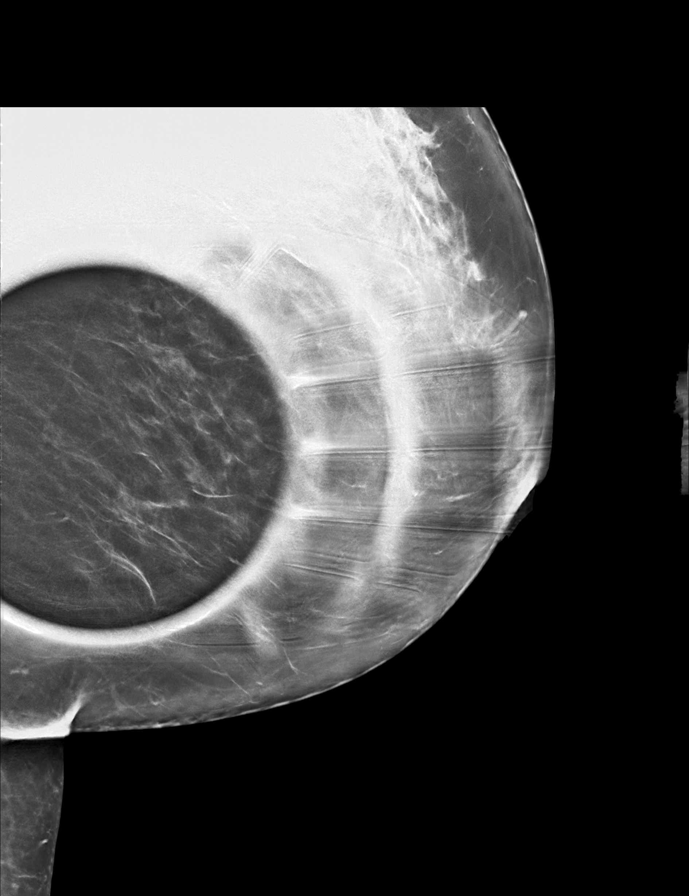

[8 of 40 positions shown; findings below may reference images not displayed]

ACR Breast Density Category c: The breast tissue is heterogeneously
dense, which may obscure small masses.
FINDINGS: Cc, MLO, full lateral, and spot compression views of the left breast
were performed with tomosynthesis. On the additional views, the
possible abnormality identified on screening mammogram is consistent
with a small intramammary lymph node which is unchanged from prior
mammograms. No suspicious abnormality is identified.

Mammographic images were processed with CAD.
IMPRESSION: No mammographic evidence of malignancy

RECOMMENDATION:
Screening mammogram in one year.(Code:GV-[id number])

I have discussed the findings and recommendations with the patient.
Results were also provided in writing at the conclusion of the
visit. If applicable, a reminder letter will be sent to the patient
regarding the next appointment.

BI-RADS CATEGORY  2: Benign.

## 2017-04-07 DIAGNOSIS — M7551 Bursitis of right shoulder: Secondary | ICD-10-CM | POA: Diagnosis not present

## 2017-04-07 DIAGNOSIS — E669 Obesity, unspecified: Secondary | ICD-10-CM | POA: Diagnosis not present

## 2017-04-07 DIAGNOSIS — S46001A Unspecified injury of muscle(s) and tendon(s) of the rotator cuff of right shoulder, initial encounter: Secondary | ICD-10-CM | POA: Diagnosis not present

## 2017-05-20 ENCOUNTER — Other Ambulatory Visit: Payer: Self-pay | Admitting: Family Medicine

## 2017-05-20 DIAGNOSIS — Z1231 Encounter for screening mammogram for malignant neoplasm of breast: Secondary | ICD-10-CM

## 2017-05-29 ENCOUNTER — Other Ambulatory Visit: Payer: Self-pay | Admitting: Family Medicine

## 2017-05-29 DIAGNOSIS — B009 Herpesviral infection, unspecified: Secondary | ICD-10-CM

## 2017-06-04 DIAGNOSIS — M2392 Unspecified internal derangement of left knee: Secondary | ICD-10-CM | POA: Diagnosis not present

## 2017-06-04 DIAGNOSIS — M25562 Pain in left knee: Secondary | ICD-10-CM | POA: Diagnosis not present

## 2017-06-04 DIAGNOSIS — E669 Obesity, unspecified: Secondary | ICD-10-CM | POA: Diagnosis not present

## 2017-06-05 ENCOUNTER — Ambulatory Visit
Admission: RE | Admit: 2017-06-05 | Discharge: 2017-06-05 | Disposition: A | Discharge status: Home / Self Care | Payer: BLUE CROSS/BLUE SHIELD | Source: Ambulatory Visit | Attending: Family Medicine | Admitting: Family Medicine

## 2017-06-05 DIAGNOSIS — Z872 Personal history of diseases of the skin and subcutaneous tissue: Secondary | ICD-10-CM | POA: Diagnosis not present

## 2017-06-05 DIAGNOSIS — Z1231 Encounter for screening mammogram for malignant neoplasm of breast: Secondary | ICD-10-CM | POA: Diagnosis not present

## 2017-06-05 DIAGNOSIS — Z86018 Personal history of other benign neoplasm: Secondary | ICD-10-CM | POA: Diagnosis not present

## 2017-06-05 DIAGNOSIS — L570 Actinic keratosis: Secondary | ICD-10-CM | POA: Diagnosis not present

## 2017-06-05 DIAGNOSIS — L578 Other skin changes due to chronic exposure to nonionizing radiation: Secondary | ICD-10-CM | POA: Diagnosis not present

## 2017-06-05 DIAGNOSIS — L821 Other seborrheic keratosis: Secondary | ICD-10-CM | POA: Diagnosis not present

## 2017-06-20 ENCOUNTER — Telehealth: Payer: Self-pay | Admitting: Family Medicine

## 2017-06-20 ENCOUNTER — Other Ambulatory Visit: Payer: Self-pay

## 2017-06-20 DIAGNOSIS — I10 Essential (primary) hypertension: Secondary | ICD-10-CM

## 2017-06-20 MED ORDER — SIMVASTATIN 40 MG PO TABS: 40.0000 mg | ORAL_TABLET | Freq: Every day | ORAL | 0 refills | Status: DC

## 2017-06-20 MED ORDER — HYDROCHLOROTHIAZIDE 25 MG PO TABS: 25.0000 mg | ORAL_TABLET | Freq: Every day | ORAL | 0 refills | Status: DC

## 2017-06-20 NOTE — Telephone Encounter (Signed)
Katrina Crawford will be coming in June 11 for med refills,she ask if you can send the one blood pressure for 30days.

## 2017-06-20 NOTE — Telephone Encounter (Signed)
I sent the HCTZ in to [facility] local, will send the chol med in for 30 days also

## 2017-07-01 ENCOUNTER — Ambulatory Visit (INDEPENDENT_AMBULATORY_CARE_PROVIDER_SITE_OTHER): Payer: BLUE CROSS/BLUE SHIELD | Admitting: Family Medicine

## 2017-07-01 ENCOUNTER — Encounter: Payer: Self-pay | Admitting: Family Medicine

## 2017-07-01 DIAGNOSIS — B009 Herpesviral infection, unspecified: Secondary | ICD-10-CM | POA: Diagnosis not present

## 2017-07-01 DIAGNOSIS — I10 Essential (primary) hypertension: Secondary | ICD-10-CM | POA: Diagnosis not present

## 2017-07-01 DIAGNOSIS — E782 Mixed hyperlipidemia: Secondary | ICD-10-CM | POA: Diagnosis not present

## 2017-07-01 MED ORDER — SIMVASTATIN 40 MG PO TABS: 40.0000 mg | ORAL_TABLET | Freq: Every day | ORAL | 2 refills | Status: DC

## 2017-07-01 MED ORDER — L-LYSINE 1000 MG PO TABS: 2.0000 | ORAL_TABLET | Freq: Every day | ORAL | 3 refills | Status: AC

## 2017-07-01 MED ORDER — HYDROCHLOROTHIAZIDE 25 MG PO TABS: 25.0000 mg | ORAL_TABLET | Freq: Every day | ORAL | 2 refills | Status: DC

## 2017-07-01 MED ORDER — VALACYCLOVIR HCL 1 G PO TABS: 1000.0000 mg | ORAL_TABLET | Freq: Two times a day (BID) | ORAL | 1 refills | Status: DC

## 2017-07-01 NOTE — Assessment & Plan Note (Signed)
Continue simvastatin 40 mg and check lipid panel for status

## 2017-07-01 NOTE — Progress Notes (Signed)
Name: Katrina Crawford   MRN: [id number]    DOB: 11/26/1955   Date:07/01/2017       Progress Note  Subjective  Chief Complaint  Chief Complaint  Patient presents with  . Hyperlipidemia  . Hypertension  . viral syndrome    herpes breakouts    Hyperlipidemia  This is a chronic problem. The current episode started more than 1 year ago. The problem is controlled. Recent lipid tests were reviewed and are normal. Exacerbating diseases include obesity. She has no history of chronic renal disease, diabetes, hypothyroidism, liver disease or nephrotic syndrome. There are no known factors aggravating her hyperlipidemia. Pertinent negatives include no chest pain, focal sensory loss, focal weakness, leg pain, myalgias or shortness of breath. Current antihyperlipidemic treatment includes statins. The current treatment provides moderate improvement of lipids. There are no compliance problems.  Risk factors for coronary artery disease include dyslipidemia, hypertension, post-menopausal and obesity.  Hypertension  This is a chronic problem. The current episode started more than 1 year ago. The problem is unchanged. The problem is controlled. Pertinent negatives include no anxiety, blurred vision, chest pain, headaches, malaise/fatigue, neck pain, orthopnea, palpitations, peripheral edema, PND, shortness of breath or sweats. There are no associated agents to hypertension. Past treatments include diuretics. The current treatment provides moderate improvement. There are no compliance problems.  There is no history of angina, kidney disease, CAD/MI, CVA, heart failure, left ventricular hypertrophy, PVD or retinopathy. There is no history of chronic renal disease, a hypertension causing med or renovascular disease.    Essential hypertension Controlled on HCTZ 25 mg for 6 months and will check renal panel.   Mixed hyperlipidemia Continue simvastatin 40 mg and check lipid panel for status  HSV-1 infection Patient to  initiate L lysine otc and valcyclovir 2 gms q doe for 1 day upon onset of viral infection.   Past Medical History:  Diagnosis Date  . Hyperlipidemia   . Hypertension     Past Surgical History:  Procedure Laterality Date  . APPENDECTOMY    . COLONOSCOPY  08/2013   cleared for 5 yrs- Dr Servando SnareWohl  . VAGINAL HYSTERECTOMY      Family History  Problem Relation Age of Onset  . Cancer Mother   . Hypertension Father   . Breast cancer Sister [id number]    Social History   Socioeconomic History  . Marital status: Married    Spouse name: Not on file  . Number of children: Not on file  . Years of education: Not on file  . Highest education level: Not on file  Occupational History  . Not on file  Social Needs  . Financial resource strain: Not on file  . Food insecurity:    Worry: Not on file    Inability: Not on file  . Transportation needs:    Medical: Not on file    Non-medical: Not on file  Tobacco Use  . Smoking status: Never Smoker  . Smokeless tobacco: Never Used  Substance and Sexual Activity  . Alcohol use: No    Alcohol/week: 0.0 oz  . Drug use: No  . Sexual activity: Yes  Lifestyle  . Physical activity:    Days per week: Not on file    Minutes per session: Not on file  . Stress: Not on file  Relationships  . Social connections:    Talks on phone: Not on file    Gets together: Not on file    Attends religious service: Not on file  Active member of club or organization: Not on file    Attends meetings of clubs or organizations: Not on file    Relationship status: Not on file  . Intimate partner violence:    Fear of current or ex partner: Not on file    Emotionally abused: Not on file    Physically abused: Not on file    Forced sexual activity: Not on file  Other Topics Concern  . Not on file  Social History Narrative  . Not on file    Allergies  Allergen Reactions  . Penicillins   . Sulfa Antibiotics   . Codeine Nausea Only    Outpatient Medications  Prior to Visit  Medication Sig Dispense Refill  . aspirin 81 MG tablet Take 81 mg by mouth daily.    . hydrochlorothiazide (HYDRODIURIL) 25 MG tablet Take 1 tablet (25 mg total) by mouth daily. 30 tablet 0  . L-Lysine 1000 MG TABS Take 2 tablets (2,000 mg total) by mouth daily. otc 100 tablet 3  . simvastatin (ZOCOR) 40 MG tablet Take 1 tablet (40 mg total) by mouth daily. 90 tablet 2  . valACYclovir (VALTREX) 1000 MG tablet TAKE 1 TABLET (1,000 MG TOTAL) BY MOUTH 2 (TWO) TIMES DAILY. AS NEEDED FOR BREAKOUT 20 tablet 0  . simvastatin (ZOCOR) 40 MG tablet Take 1 tablet (40 mg total) by mouth at bedtime. 30 tablet 0   No facility-administered medications prior to visit.     Review of Systems  Constitutional: Negative for chills, fever, malaise/fatigue and weight loss.  HENT: Negative for ear discharge, ear pain and sore throat.   Eyes: Negative for blurred vision.  Respiratory: Negative for cough, sputum production, shortness of breath and wheezing.   Cardiovascular: Negative for chest pain, palpitations, orthopnea, leg swelling and PND.  Gastrointestinal: Negative for abdominal pain, blood in stool, constipation, diarrhea, heartburn, melena and nausea.  Genitourinary: Negative for dysuria, frequency, hematuria and urgency.  Musculoskeletal: Negative for back pain, joint pain, myalgias and neck pain.  Skin: Negative for rash.  Neurological: Negative for dizziness, tingling, sensory change, focal weakness and headaches.  Endo/Heme/Allergies: Negative for environmental allergies and polydipsia. Does not bruise/bleed easily.  Psychiatric/Behavioral: Negative for depression and suicidal ideas. The patient is not nervous/anxious and does not have insomnia.      Objective  Vitals:   07/01/17 0823  BP: 130/80  Pulse: 80  Weight: 232 lb (105.2 kg)  Height: 5\' 9"  (1.753 m)    Physical Exam  Constitutional: She is oriented to person, place, and time. She appears well-developed and  well-nourished.  HENT:  Head: Normocephalic.  Right Ear: External ear normal.  Left Ear: External ear normal.  Mouth/Throat: Oropharynx is clear and moist.  Eyes: Pupils are equal, round, and reactive to light. Conjunctivae and EOM are normal. Lids are everted and swept, no foreign bodies found. Left eye exhibits no hordeolum. No foreign body present in the left eye. Right conjunctiva is not injected. Left conjunctiva is not injected. No scleral icterus.  Neck: Normal range of motion. Neck supple. No JVD present. No tracheal deviation present. No thyromegaly present.  Cardiovascular: Normal rate, regular rhythm, normal heart sounds and intact distal pulses. Exam reveals no gallop and no friction rub.  No murmur heard. Pulmonary/Chest: Effort normal and breath sounds normal. No respiratory distress. She has no wheezes. She has no rales.  Abdominal: Soft. Bowel sounds are normal. She exhibits no mass. There is no hepatosplenomegaly. There is no tenderness. There is no  rebound and no guarding.  Musculoskeletal: Normal range of motion. She exhibits no edema or tenderness.  Lymphadenopathy:    She has no cervical adenopathy.  Neurological: She is alert and oriented to person, place, and time. She has normal strength. She displays normal reflexes. No cranial nerve deficit.  Skin: Skin is warm. No rash noted.  Psychiatric: She has a normal mood and affect. Her mood appears not anxious. She does not exhibit a depressed mood.  Nursing note and vitals reviewed.     Assessment & Plan  Problem List Items Addressed This Visit      Cardiovascular and Mediastinum   Essential hypertension    Controlled on HCTZ 25 mg for 6 months and will check renal panel.       Relevant Medications   hydrochlorothiazide (HYDRODIURIL) 25 MG tablet   simvastatin (ZOCOR) 40 MG tablet   Other Relevant Orders   Renal Function Panel     Other   Mixed hyperlipidemia    Continue simvastatin 40 mg and check lipid  panel for status      Relevant Medications   hydrochlorothiazide (HYDRODIURIL) 25 MG tablet   simvastatin (ZOCOR) 40 MG tablet   Other Relevant Orders   Lipid panel   HSV-1 infection    Patient to initiate L lysine otc and valcyclovir 2 gms q doe for 1 day upon onset of viral infection.      Relevant Medications   valACYclovir (VALTREX) 1000 MG tablet   L-Lysine 1000 MG TABS      Meds ordered this encounter  Medications  . hydrochlorothiazide (HYDRODIURIL) 25 MG tablet    Sig: Take 1 tablet (25 mg total) by mouth daily.    Dispense:  90 tablet    Refill:  2  . valACYclovir (VALTREX) 1000 MG tablet    Sig: Take 1 tablet (1,000 mg total) by mouth 2 (two) times daily. As needed for breakout    Dispense:  20 tablet    Refill:  1  . L-Lysine 1000 MG TABS    Sig: Take 2 tablets (2,000 mg total) by mouth daily. otc    Dispense:  100 tablet    Refill:  3  . simvastatin (ZOCOR) 40 MG tablet    Sig: Take 1 tablet (40 mg total) by mouth daily.    Dispense:  90 tablet    Refill:  2      Dr. Elizabeth Sauer [facility]  07/01/17

## 2017-07-01 NOTE — Assessment & Plan Note (Addendum)
Patient to initiate L lysine otc and valcyclovir 2 gms q doe for 1 day upon onset of viral infection.

## 2017-07-01 NOTE — Assessment & Plan Note (Signed)
Controlled on HCTZ 25 mg for 6 months and will check renal panel.

## 2017-07-02 LAB — RENAL FUNCTION PANEL
Albumin: 4.6 g/dL (ref 3.6–4.8)
BUN / CREAT RATIO: 18 (ref 12–28)
BUN: 14 mg/dL (ref 8–27)
CALCIUM: 10.2 mg/dL (ref 8.7–10.3)
CO2: 22 mmol/L (ref 20–29)
CREATININE: 0.8 mg/dL (ref 0.57–1.00)
Chloride: 101 mmol/L (ref 96–106)
GFR calc Af Amer: 91 mL/min/{1.73_m2} (ref >59)
GFR calc non Af Amer: 79 mL/min/{1.73_m2} (ref >59)
Glucose: 93 mg/dL (ref 65–99)
Phosphorus: 3.3 mg/dL (ref 2.5–4.5)
Potassium: 4.4 mmol/L (ref 3.5–5.2)
Sodium: 145 mmol/L — ABNORMAL HIGH (ref 134–144)

## 2017-07-02 LAB — LIPID PANEL
CHOLESTEROL TOTAL: 200 mg/dL — AB (ref 100–199)
Chol/HDL Ratio: 3.5 ratio (ref 0.0–4.4)
HDL: 57 mg/dL (ref >39)
LDL CALC: 115 mg/dL — AB (ref 0–99)
TRIGLYCERIDES: 139 mg/dL (ref 0–149)
VLDL CHOLESTEROL CAL: 28 mg/dL (ref 5–40)

## 2017-07-15 ENCOUNTER — Other Ambulatory Visit: Payer: Self-pay | Admitting: Family Medicine

## 2017-07-15 DIAGNOSIS — E782 Mixed hyperlipidemia: Secondary | ICD-10-CM

## 2017-07-15 DIAGNOSIS — I10 Essential (primary) hypertension: Secondary | ICD-10-CM

## 2017-08-19 DIAGNOSIS — M2392 Unspecified internal derangement of left knee: Secondary | ICD-10-CM | POA: Diagnosis not present

## 2017-08-19 DIAGNOSIS — M1712 Unilateral primary osteoarthritis, left knee: Secondary | ICD-10-CM | POA: Insufficient documentation

## 2017-08-19 DIAGNOSIS — M25562 Pain in left knee: Secondary | ICD-10-CM | POA: Diagnosis not present

## 2017-08-19 DIAGNOSIS — E669 Obesity, unspecified: Secondary | ICD-10-CM | POA: Diagnosis not present

## 2017-08-27 DIAGNOSIS — M948X6 Other specified disorders of cartilage, lower leg: Secondary | ICD-10-CM | POA: Diagnosis not present

## 2017-08-27 DIAGNOSIS — M25462 Effusion, left knee: Secondary | ICD-10-CM | POA: Diagnosis not present

## 2017-08-27 DIAGNOSIS — M23322 Other meniscus derangements, posterior horn of medial meniscus, left knee: Secondary | ICD-10-CM | POA: Diagnosis not present

## 2017-08-27 DIAGNOSIS — M23332 Other meniscus derangements, other medial meniscus, left knee: Secondary | ICD-10-CM | POA: Diagnosis not present

## 2017-09-09 DIAGNOSIS — E669 Obesity, unspecified: Secondary | ICD-10-CM | POA: Diagnosis not present

## 2017-09-09 DIAGNOSIS — M2392 Unspecified internal derangement of left knee: Secondary | ICD-10-CM | POA: Diagnosis not present

## 2017-09-17 HISTORY — PX: REPLACEMENT TOTAL KNEE: SUR1224

## 2017-10-28 DIAGNOSIS — M1712 Unilateral primary osteoarthritis, left knee: Secondary | ICD-10-CM | POA: Diagnosis not present

## 2017-10-30 DIAGNOSIS — K036 Deposits [accretions] on teeth: Secondary | ICD-10-CM | POA: Diagnosis not present

## 2017-10-30 DIAGNOSIS — Z1281 Encounter for screening for malignant neoplasm of oral cavity: Secondary | ICD-10-CM | POA: Diagnosis not present

## 2017-10-30 DIAGNOSIS — K0500 Acute gingivitis, plaque induced: Secondary | ICD-10-CM | POA: Diagnosis not present

## 2017-11-03 DIAGNOSIS — Z01811 Encounter for preprocedural respiratory examination: Secondary | ICD-10-CM | POA: Diagnosis not present

## 2017-11-03 DIAGNOSIS — Z01810 Encounter for preprocedural cardiovascular examination: Secondary | ICD-10-CM | POA: Diagnosis not present

## 2017-11-03 DIAGNOSIS — Z01812 Encounter for preprocedural laboratory examination: Secondary | ICD-10-CM | POA: Diagnosis not present

## 2017-11-03 DIAGNOSIS — I10 Essential (primary) hypertension: Secondary | ICD-10-CM | POA: Diagnosis not present

## 2017-11-03 DIAGNOSIS — M1712 Unilateral primary osteoarthritis, left knee: Secondary | ICD-10-CM | POA: Diagnosis not present

## 2017-11-03 DIAGNOSIS — Z01818 Encounter for other preprocedural examination: Secondary | ICD-10-CM | POA: Diagnosis not present

## 2017-11-03 DIAGNOSIS — M2392 Unspecified internal derangement of left knee: Secondary | ICD-10-CM | POA: Diagnosis not present

## 2017-11-17 DIAGNOSIS — B009 Herpesviral infection, unspecified: Secondary | ICD-10-CM | POA: Diagnosis not present

## 2017-11-17 DIAGNOSIS — G8918 Other acute postprocedural pain: Secondary | ICD-10-CM | POA: Diagnosis not present

## 2017-11-17 DIAGNOSIS — Z7982 Long term (current) use of aspirin: Secondary | ICD-10-CM | POA: Diagnosis not present

## 2017-11-17 DIAGNOSIS — E785 Hyperlipidemia, unspecified: Secondary | ICD-10-CM | POA: Diagnosis not present

## 2017-11-17 DIAGNOSIS — M1712 Unilateral primary osteoarthritis, left knee: Secondary | ICD-10-CM | POA: Diagnosis not present

## 2017-11-17 DIAGNOSIS — M25562 Pain in left knee: Secondary | ICD-10-CM | POA: Diagnosis not present

## 2017-11-17 DIAGNOSIS — I10 Essential (primary) hypertension: Secondary | ICD-10-CM | POA: Diagnosis not present

## 2017-11-18 HISTORY — PX: JOINT REPLACEMENT: SHX530

## 2017-11-20 DIAGNOSIS — M1712 Unilateral primary osteoarthritis, left knee: Secondary | ICD-10-CM | POA: Diagnosis not present

## 2017-11-20 DIAGNOSIS — M25562 Pain in left knee: Secondary | ICD-10-CM | POA: Diagnosis not present

## 2017-11-20 DIAGNOSIS — M25662 Stiffness of left knee, not elsewhere classified: Secondary | ICD-10-CM | POA: Diagnosis not present

## 2017-11-24 DIAGNOSIS — M25662 Stiffness of left knee, not elsewhere classified: Secondary | ICD-10-CM | POA: Diagnosis not present

## 2017-11-24 DIAGNOSIS — M25562 Pain in left knee: Secondary | ICD-10-CM | POA: Diagnosis not present

## 2017-11-27 DIAGNOSIS — M25662 Stiffness of left knee, not elsewhere classified: Secondary | ICD-10-CM | POA: Diagnosis not present

## 2017-11-27 DIAGNOSIS — M25562 Pain in left knee: Secondary | ICD-10-CM | POA: Diagnosis not present

## 2017-11-28 DIAGNOSIS — M25662 Stiffness of left knee, not elsewhere classified: Secondary | ICD-10-CM | POA: Diagnosis not present

## 2017-11-28 DIAGNOSIS — M25562 Pain in left knee: Secondary | ICD-10-CM | POA: Diagnosis not present

## 2017-12-01 DIAGNOSIS — M25662 Stiffness of left knee, not elsewhere classified: Secondary | ICD-10-CM | POA: Diagnosis not present

## 2017-12-01 DIAGNOSIS — M25562 Pain in left knee: Secondary | ICD-10-CM | POA: Diagnosis not present

## 2017-12-04 DIAGNOSIS — M25562 Pain in left knee: Secondary | ICD-10-CM | POA: Diagnosis not present

## 2017-12-04 DIAGNOSIS — M25662 Stiffness of left knee, not elsewhere classified: Secondary | ICD-10-CM | POA: Diagnosis not present

## 2017-12-04 DIAGNOSIS — Z96652 Presence of left artificial knee joint: Secondary | ICD-10-CM | POA: Insufficient documentation

## 2017-12-05 DIAGNOSIS — M25562 Pain in left knee: Secondary | ICD-10-CM | POA: Diagnosis not present

## 2017-12-05 DIAGNOSIS — M25662 Stiffness of left knee, not elsewhere classified: Secondary | ICD-10-CM | POA: Diagnosis not present

## 2017-12-08 DIAGNOSIS — M25562 Pain in left knee: Secondary | ICD-10-CM | POA: Diagnosis not present

## 2017-12-08 DIAGNOSIS — M25662 Stiffness of left knee, not elsewhere classified: Secondary | ICD-10-CM | POA: Diagnosis not present

## 2017-12-10 DIAGNOSIS — H25813 Combined forms of age-related cataract, bilateral: Secondary | ICD-10-CM | POA: Diagnosis not present

## 2017-12-11 DIAGNOSIS — M25562 Pain in left knee: Secondary | ICD-10-CM | POA: Diagnosis not present

## 2017-12-11 DIAGNOSIS — M25662 Stiffness of left knee, not elsewhere classified: Secondary | ICD-10-CM | POA: Diagnosis not present

## 2017-12-12 DIAGNOSIS — M25562 Pain in left knee: Secondary | ICD-10-CM | POA: Diagnosis not present

## 2017-12-12 DIAGNOSIS — M25662 Stiffness of left knee, not elsewhere classified: Secondary | ICD-10-CM | POA: Diagnosis not present

## 2017-12-16 DIAGNOSIS — M25662 Stiffness of left knee, not elsewhere classified: Secondary | ICD-10-CM | POA: Diagnosis not present

## 2017-12-16 DIAGNOSIS — M25562 Pain in left knee: Secondary | ICD-10-CM | POA: Diagnosis not present

## 2017-12-19 DIAGNOSIS — M25662 Stiffness of left knee, not elsewhere classified: Secondary | ICD-10-CM | POA: Diagnosis not present

## 2017-12-19 DIAGNOSIS — M25562 Pain in left knee: Secondary | ICD-10-CM | POA: Diagnosis not present

## 2017-12-31 ENCOUNTER — Encounter: Payer: Self-pay | Admitting: Family Medicine

## 2017-12-31 ENCOUNTER — Ambulatory Visit (INDEPENDENT_AMBULATORY_CARE_PROVIDER_SITE_OTHER): Payer: BLUE CROSS/BLUE SHIELD | Admitting: Family Medicine

## 2017-12-31 VITALS — BP 132/78 | HR 114 | Temp 98.3°F | Ht 69.0 in | Wt 233.0 lb

## 2017-12-31 DIAGNOSIS — J0100 Acute maxillary sinusitis, unspecified: Secondary | ICD-10-CM

## 2017-12-31 MED ORDER — AZITHROMYCIN 250 MG PO TABS: ORAL_TABLET | ORAL | 0 refills | Status: DC

## 2017-12-31 NOTE — Progress Notes (Signed)
Date:  12/31/2017   Name:  Katrina Crawford   DOB:  10/14/1955   MRN:  [id number]   Chief Complaint: Sore Throat (Started Saturday. Drainage in throat. Coughing- yellow production.) Sore Throat   This is a new problem. The current episode started in the past 7 days. The problem has been gradually worsening. There has been no fever. The fever has been present for less than 1 day. Associated symptoms include congestion and headaches. Pertinent negatives include no abdominal pain, coughing, diarrhea, drooling, ear discharge, ear pain, hoarse voice, plugged ear sensation, neck pain, shortness of breath, stridor, swollen glands, trouble swallowing or vomiting. She has tried nothing for the symptoms.  Sinusitis  This is a new problem. The current episode started in the past 7 days (Saturday). The problem is unchanged. There has been no fever. The fever has been present for less than 1 day. The pain is mild. Associated symptoms include congestion, headaches and a sore throat. Pertinent negatives include no chills, coughing, diaphoresis, ear pain, hoarse voice, neck pain, shortness of breath, sinus pressure, sneezing or swollen glands. Past treatments include nothing. The treatment provided mild relief.     Review of Systems  Constitutional: Negative.  Negative for chills, diaphoresis, fatigue, fever and unexpected weight change.  HENT: Positive for congestion and sore throat. Negative for drooling, ear discharge, ear pain, hoarse voice, rhinorrhea, sinus pressure, sneezing and trouble swallowing.   Eyes: Negative for photophobia, pain, discharge, redness and itching.  Respiratory: Negative for cough, shortness of breath, wheezing and stridor.   Gastrointestinal: Negative for abdominal pain, blood in stool, constipation, diarrhea, nausea and vomiting.  Endocrine: Negative for cold intolerance, heat intolerance, polydipsia, polyphagia and polyuria.  Genitourinary: Negative for dysuria, flank pain,  frequency, hematuria, menstrual problem, pelvic pain, urgency, vaginal bleeding and vaginal discharge.  Musculoskeletal: Negative for arthralgias, back pain, myalgias and neck pain.  Skin: Negative for rash.  Allergic/Immunologic: Negative for environmental allergies and food allergies.  Neurological: Positive for headaches. Negative for dizziness, weakness, light-headedness and numbness.  Hematological: Negative for adenopathy. Does not bruise/bleed easily.  Psychiatric/Behavioral: Negative for dysphoric mood. The patient is not nervous/anxious.     Patient Active Problem List   Diagnosis Date Noted  . Essential hypertension 05/02/2016  . Mixed hyperlipidemia 05/02/2016  . HSV-1 infection 05/02/2016    Allergies  Allergen Reactions  . Penicillins   . Sulfa Antibiotics   . Codeine Nausea Only    Past Surgical History:  Procedure Laterality Date  . APPENDECTOMY    . COLONOSCOPY  08/2013   cleared for 5 yrs- Dr Servando SnareWohl  . REPLACEMENT TOTAL KNEE Left 09/17/2017  . VAGINAL HYSTERECTOMY      Social History   Tobacco Use  . Smoking status: Never Smoker  . Smokeless tobacco: Never Used  Substance Use Topics  . Alcohol use: No    Alcohol/week: 0.0 standard drinks  . Drug use: No     Medication list has been reviewed and updated.  Current Meds  Medication Sig  . aspirin 81 MG tablet Take 81 mg by mouth daily.  . hydrochlorothiazide (HYDRODIURIL) 25 MG tablet Take 1 tablet (25 mg total) by mouth daily.  Marland Kitchen. L-Lysine 1000 MG TABS Take 2 tablets (2,000 mg total) by mouth daily. otc  . meloxicam (MOBIC) 15 MG tablet Take 15 mg by mouth daily. Dr Dedra Skeensodd Mundy  . simvastatin (ZOCOR) 40 MG tablet Take 1 tablet (40 mg total) by mouth daily.  . valACYclovir (VALTREX) 1000  MG tablet Take 1 tablet (1,000 mg total) by mouth 2 (two) times daily. As needed for breakout    [facility] 2/9 Scores 07/01/2017 10/25/2016 03/24/2015 12/23/2014  PHQ - 2 Score 0 0 0 0  PHQ- 9 Score 0 0 - -    Physical Exam   Constitutional: She is oriented to person, place, and time. She appears well-developed and well-nourished. No distress.  HENT:  Head: Normocephalic and atraumatic.  Right Ear: Hearing, tympanic membrane, external ear and ear canal normal.  Left Ear: Hearing, tympanic membrane, external ear and ear canal normal.  Nose: Mucosal edema present. Right sinus exhibits maxillary sinus tenderness. Right sinus exhibits no frontal sinus tenderness. Left sinus exhibits maxillary sinus tenderness. Left sinus exhibits no frontal sinus tenderness.  Mouth/Throat: Oropharynx is clear and moist and mucous membranes are normal. No oropharyngeal exudate, posterior oropharyngeal edema or posterior oropharyngeal erythema.  Eyes: Pupils are equal, round, and reactive to light. Conjunctivae and EOM are normal. Lids are everted and swept, no foreign bodies found. Right eye exhibits no discharge. Left eye exhibits no discharge and no hordeolum. No foreign body present in the left eye. Right conjunctiva is not injected. Left conjunctiva is not injected. No scleral icterus.  Neck: Normal range of motion. Neck supple. No JVD present. No tracheal deviation present. No thyromegaly present.  Cardiovascular: Normal rate, regular rhythm, normal heart sounds and intact distal pulses. Exam reveals no gallop and no friction rub.  No murmur heard. Pulmonary/Chest: Effort normal and breath sounds normal. No stridor. No respiratory distress. She has no decreased breath sounds. She has no wheezes. She has no rales.  Abdominal: Soft. Bowel sounds are normal. She exhibits no mass. There is no hepatosplenomegaly. There is no tenderness. There is no rebound and no guarding.  Musculoskeletal: Normal range of motion. She exhibits no edema or tenderness.  Lymphadenopathy:    She has no cervical adenopathy.  Neurological: She is alert and oriented to person, place, and time. She has normal strength and normal reflexes. She displays normal  reflexes. No cranial nerve deficit.  Skin: Skin is warm and dry. No rash noted. She is not diaphoretic.  Psychiatric: She has a normal mood and affect. Her mood appears not anxious. She does not exhibit a depressed mood.  Nursing note and vitals reviewed.   BP 132/78   Pulse (!) 114   Temp 98.3 F (36.8 C) (Oral)   Ht 5\' 9"  (1.753 m)   Wt 233 lb (105.7 kg)   SpO2 98%   BMI 34.41 kg/m   Assessment and Plan:  1. Acute maxillary sinusitis, recurrence not specified 1. Acute maxillary sinusitis, recurrence not specified Acute.  Persistent.  Will initiate azithromycin 250 mg 2 today followed by 1 a day for 4 days.  Patient to have dental work in 6 days and was instructed to tell dental personnel that she is still under the coverage of azithromycin at the time of her dental work. - azithromycin (ZITHROMAX) 250 MG tablet; 2 today then 1 a day for 4 days  Dispense: 6 tablet; Refill: 0 - azithromycin (ZITHROMAX) 250 MG tablet; 2 today then 1 a day for 4 days  Dispense: 6 tablet; Refill: 0   Dr. Hayden [facility]  12/31/2017

## 2018-01-19 DIAGNOSIS — Z1281 Encounter for screening for malignant neoplasm of oral cavity: Secondary | ICD-10-CM | POA: Diagnosis not present

## 2018-01-19 DIAGNOSIS — K05322 Chronic periodontitis, generalized, moderate: Secondary | ICD-10-CM | POA: Diagnosis not present

## 2018-04-25 ENCOUNTER — Other Ambulatory Visit: Payer: Self-pay | Admitting: Family Medicine

## 2018-04-25 DIAGNOSIS — I10 Essential (primary) hypertension: Secondary | ICD-10-CM

## 2018-07-06 ENCOUNTER — Other Ambulatory Visit: Payer: Self-pay | Admitting: Family Medicine

## 2018-07-06 DIAGNOSIS — Z1231 Encounter for screening mammogram for malignant neoplasm of breast: Secondary | ICD-10-CM

## 2018-07-14 ENCOUNTER — Other Ambulatory Visit: Payer: Self-pay

## 2018-07-14 ENCOUNTER — Other Ambulatory Visit: Payer: Self-pay | Admitting: Family Medicine

## 2018-07-14 ENCOUNTER — Ambulatory Visit
Admission: RE | Admit: 2018-07-14 | Discharge: 2018-07-14 | Disposition: A | Discharge status: Home / Self Care | Payer: 59 | Source: Ambulatory Visit | Attending: Family Medicine | Admitting: Family Medicine

## 2018-07-14 DIAGNOSIS — Z1231 Encounter for screening mammogram for malignant neoplasm of breast: Secondary | ICD-10-CM

## 2018-07-14 DIAGNOSIS — E782 Mixed hyperlipidemia: Secondary | ICD-10-CM

## 2018-07-22 ENCOUNTER — Encounter: Payer: Self-pay | Admitting: Family Medicine

## 2018-07-22 ENCOUNTER — Other Ambulatory Visit: Payer: Self-pay

## 2018-07-22 ENCOUNTER — Ambulatory Visit (INDEPENDENT_AMBULATORY_CARE_PROVIDER_SITE_OTHER): Payer: 59 | Admitting: Family Medicine

## 2018-07-22 VITALS — BP 122/80 | HR 76 | Ht 69.0 in | Wt 234.0 lb

## 2018-07-22 DIAGNOSIS — I10 Essential (primary) hypertension: Secondary | ICD-10-CM

## 2018-07-22 DIAGNOSIS — E782 Mixed hyperlipidemia: Secondary | ICD-10-CM | POA: Diagnosis not present

## 2018-07-22 DIAGNOSIS — B009 Herpesviral infection, unspecified: Secondary | ICD-10-CM | POA: Diagnosis not present

## 2018-07-22 DIAGNOSIS — R69 Illness, unspecified: Secondary | ICD-10-CM

## 2018-07-22 MED ORDER — VALACYCLOVIR HCL 1 G PO TABS: 1000.0000 mg | ORAL_TABLET | Freq: Two times a day (BID) | ORAL | 1 refills | Status: DC

## 2018-07-22 NOTE — Progress Notes (Signed)
Date:  07/22/2018   Name:  Katrina RegesYvonne R Hammac   DOB:  07/21/1955   MRN:  [id number]   Chief Complaint: Hypertension, Hyperlipidemia, and genital herpes (refill this med only)  Patient is a 63 year old female who presents for a viral recurrence exam. The patient reports the following problems: none. Health maintenance has been reviewed up to date.  Hypertension This is a chronic problem. The current episode started more than 1 year ago. The problem is unchanged. The problem is controlled. Pertinent negatives include no anxiety, blurred vision, chest pain, headaches, malaise/fatigue, neck pain, orthopnea, palpitations, peripheral edema, PND, shortness of breath or sweats. There are no associated agents to hypertension. There are no known risk factors for coronary artery disease. Past treatments include diuretics. The current treatment provides no improvement. There are no compliance problems.  There is no history of angina, kidney disease, CAD/MI, CVA, heart failure, left ventricular hypertrophy, PVD or retinopathy. There is no history of chronic renal disease, a hypertension causing med or renovascular disease.  Hyperlipidemia This is a chronic problem. The current episode started more than 1 year ago. The problem is controlled. Recent lipid tests were reviewed and are normal. She has no history of chronic renal disease, diabetes, hypothyroidism, liver disease, obesity or nephrotic syndrome. There are no known factors aggravating her hyperlipidemia. Pertinent negatives include no chest pain, focal sensory loss, focal weakness, leg pain, myalgias or shortness of breath. Current antihyperlipidemic treatment includes statins. The current treatment provides moderate improvement of lipids. There are no compliance problems.  Risk factors for coronary artery disease include post-menopausal, hypertension and dyslipidemia.    Review of Systems  Constitutional: Negative for chills, fever and malaise/fatigue.   HENT: Negative for drooling, ear discharge, ear pain and sore throat.   Eyes: Negative for blurred vision.  Respiratory: Negative for cough, shortness of breath and wheezing.   Cardiovascular: Negative for chest pain, palpitations, orthopnea, leg swelling and PND.  Gastrointestinal: Negative for abdominal pain, blood in stool, constipation, diarrhea and nausea.  Endocrine: Negative for polydipsia.  Genitourinary: Negative for dysuria, frequency, hematuria and urgency.  Musculoskeletal: Negative for back pain, myalgias and neck pain.  Skin: Negative for rash.  Allergic/Immunologic: Negative for environmental allergies.  Neurological: Negative for dizziness, focal weakness and headaches.  Hematological: Does not bruise/bleed easily.  Psychiatric/Behavioral: Negative for suicidal ideas. The patient is not nervous/anxious.     Patient Active Problem List   Diagnosis Date Noted  . Essential hypertension 05/02/2016  . Mixed hyperlipidemia 05/02/2016  . HSV-1 infection 05/02/2016    Allergies  Allergen Reactions  . Penicillins   . Sulfa Antibiotics   . Codeine Nausea Only    Past Surgical History:  Procedure Laterality Date  . APPENDECTOMY    . COLONOSCOPY  08/2013   cleared for 5 yrs- Dr Servando SnareWohl  . REPLACEMENT TOTAL KNEE Left 09/17/2017  . VAGINAL HYSTERECTOMY      Social History   Tobacco Use  . Smoking status: Never Smoker  . Smokeless tobacco: Never Used  Substance Use Topics  . Alcohol use: No    Alcohol/week: 0.0 standard drinks  . Drug use: No     Medication list has been reviewed and updated.  Current Meds  Medication Sig  . aspirin 81 MG tablet Take 81 mg by mouth daily.  . hydrochlorothiazide (HYDRODIURIL) 25 MG tablet TAKE 1 TABLET DAILY  . L-Lysine 1000 MG TABS Take 2 tablets (2,000 mg total) by mouth daily. otc  . meloxicam (  MOBIC) 15 MG tablet Take 15 mg by mouth daily. Dr Reche Dixon  . simvastatin (ZOCOR) 40 MG tablet TAKE 1 TABLET DAILY  .  valACYclovir (VALTREX) 1000 MG tablet Take 1 tablet (1,000 mg total) by mouth 2 (two) times daily. As needed for breakout    [facility] 2/9 Scores 07/22/2018 07/01/2017 10/25/2016 03/24/2015  PHQ - 2 Score 0 0 0 0  PHQ- 9 Score 0 0 0 -    BP Readings from Last 3 Encounters:  07/22/18 122/80  12/31/17 132/78  07/01/17 130/80    Physical Exam Vitals signs and nursing note reviewed.  Constitutional:      Appearance: She is well-developed.  HENT:     Head: Normocephalic.     Right Ear: Tympanic membrane, ear canal and external ear normal. There is no impacted cerumen.     Left Ear: Tympanic membrane, ear canal and external ear normal. There is no impacted cerumen.     Nose: Nose normal. No congestion or rhinorrhea.     Mouth/Throat:     Mouth: Mucous membranes are moist.     Pharynx: Oropharynx is clear. No oropharyngeal exudate or posterior oropharyngeal erythema.  Eyes:     General: Lids are everted, no foreign bodies appreciated. No scleral icterus.       Left eye: No foreign body or hordeolum.     Conjunctiva/sclera: Conjunctivae normal.     Right eye: Right conjunctiva is not injected.     Left eye: Left conjunctiva is not injected.     Pupils: Pupils are equal, round, and reactive to light.  Neck:     Musculoskeletal: Normal range of motion and neck supple.     Thyroid: No thyromegaly.     Vascular: No JVD.     Trachea: No tracheal deviation.  Cardiovascular:     Rate and Rhythm: Normal rate and regular rhythm.     Pulses: Normal pulses.     Heart sounds: Normal heart sounds. No murmur. No friction rub. No gallop.   Pulmonary:     Effort: Pulmonary effort is normal. No respiratory distress.     Breath sounds: Normal breath sounds. No stridor. No wheezing, rhonchi or rales.  Chest:     Chest wall: No tenderness.  Abdominal:     General: Bowel sounds are normal.     Palpations: Abdomen is soft. There is no mass.     Tenderness: There is no abdominal tenderness. There is no  guarding or rebound.  Musculoskeletal: Normal range of motion.        General: No tenderness.  Lymphadenopathy:     Cervical: No cervical adenopathy.  Skin:    General: Skin is warm.     Capillary Refill: Capillary refill takes less than 2 seconds.     Findings: No rash.  Neurological:     General: No focal deficit present.     Mental Status: She is alert and oriented to person, place, and time.     Cranial Nerves: No cranial nerve deficit.     Deep Tendon Reflexes: Reflexes normal.  Psychiatric:        Mood and Affect: Mood is not anxious or depressed.     Wt Readings from Last 3 Encounters:  07/22/18 234 lb (106.1 kg)  12/31/17 233 lb (105.7 kg)  07/01/17 232 lb (105.2 kg)    BP 122/80   Pulse 76   Ht 5\' 9"  (1.753 m)   Wt 234 lb (106.1 kg)   BMI  34.56 kg/m   Assessment and Plan: 1. HSV-1 infection She has a history of HSV recurrence for which she takes Valtrex as needed for breakouts. - valACYclovir (VALTREX) 1000 MG tablet; Take 1 tablet (1,000 mg total) by mouth 2 (two) times daily. As needed for breakout  Dispense: 20 tablet; Refill: 1  2. Essential hypertension Chronic.  Controlled.  Continue current therapy of hydrochlorothiazide 25 mg once a day.  Will check renal function panel. - Renal Function Panel  3. Mixed hyperlipidemia Chronic.  Controlled.  Continue simvastatin 40 mg once a day.  Will check lipid panel. - Lipid Panel With LDL/HDL Ratio  4. Taking medication for chronic disease Is on statin agent for which we will check hepatic function panel to evaluate for hepatotoxicity. - Hepatic function panel

## 2018-07-22 NOTE — Patient Instructions (Signed)
Mediterranean Diet A Mediterranean diet refers to food and lifestyle choices that are based on the traditions of countries located on the [place]. This way of eating has been shown to help prevent certain conditions and improve outcomes for people who have chronic diseases, like kidney disease and heart disease. What are tips for following this plan? Lifestyle  Cook and eat meals together with your family, when possible.  Drink enough fluid to keep your urine clear or pale yellow.  Be physically active every day. This includes: ? Aerobic exercise like running or swimming. ? Leisure activities like gardening, walking, or housework.  Get 7-8 hours of sleep each night.  If recommended by your health care provider, drink red wine in moderation. This means 1 glass a day for nonpregnant women and 2 glasses a day for men. A glass of wine equals 5 oz (150 mL). Reading food labels   Check the serving size of packaged foods. For foods such as rice and pasta, the serving size refers to the amount of cooked product, not dry.  Check the total fat in packaged foods. Avoid foods that have saturated fat or trans fats.  Check the ingredients list for added sugars, such as corn syrup. Shopping  At the grocery store, buy most of your food from the areas near the walls of the store. This includes: ? Fresh fruits and vegetables (produce). ? Grains, beans, nuts, and seeds. Some of these may be available in unpackaged forms or large amounts (in bulk). ? Fresh seafood. ? Poultry and eggs. ? Low-fat dairy products.  Buy whole ingredients instead of prepackaged foods.  Buy fresh fruits and vegetables in-season from local farmers markets.  Buy frozen fruits and vegetables in resealable bags.  If you do not have access to quality fresh seafood, buy precooked frozen shrimp or canned fish, such as tuna, salmon, or sardines.  Buy small amounts of raw or cooked vegetables, salads, or olives from  the deli or salad bar at your store.  Stock your pantry so you always have certain foods on hand, such as olive oil, canned tuna, canned tomatoes, rice, pasta, and beans. Cooking  Cook foods with extra-virgin olive oil instead of using butter or other vegetable oils.  Have meat as a side dish, and have vegetables or grains as your main dish. This means having meat in small portions or adding small amounts of meat to foods like pasta or stew.  Use beans or vegetables instead of meat in common dishes like chili or lasagna.  Experiment with different cooking methods. Try roasting or broiling vegetables instead of steaming or sauteing them.  Add frozen vegetables to soups, stews, pasta, or rice.  Add nuts or seeds for added healthy fat at each meal. You can add these to yogurt, salads, or vegetable dishes.  Marinate fish or vegetables using olive oil, lemon juice, garlic, and fresh herbs. Meal planning   Plan to eat 1 vegetarian meal one day each week. Try to work up to 2 vegetarian meals, if possible.  Eat seafood 2 or more times a week.  Have healthy snacks readily available, such as: ? Vegetable sticks with hummus. ? Greek yogurt. ? Fruit and nut trail mix.  Eat balanced meals throughout the week. This includes: ? Fruit: 2-3 servings a day ? Vegetables: 4-5 servings a day ? Low-fat dairy: 2 servings a day ? Fish, poultry, or lean meat: 1 serving a day ? Beans and legumes: 2 or more servings a week ?   Nuts and seeds: 1-2 servings a day ? Whole grains: 6-8 servings a day ? Extra-virgin olive oil: 3-4 servings a day  Limit red meat and sweets to only a few servings a month What are my food choices?  Mediterranean diet ? Recommended  Grains: Whole-grain pasta. Brown rice. Bulgar wheat. Polenta. Couscous. Whole-wheat bread. Oatmeal. Quinoa.  Vegetables: Artichokes. Beets. Broccoli. Cabbage. Carrots. Eggplant. Green beans. Chard. Kale. Spinach. Onions. Leeks. Peas. Squash.  Tomatoes. Peppers. Radishes.  Fruits: Apples. Apricots. Avocado. Berries. Bananas. Cherries. Dates. Figs. Grapes. Lemons. Melon. Oranges. Peaches. Plums. Pomegranate.  Meats and other protein foods: Beans. Almonds. Sunflower seeds. Pine nuts. Peanuts. Cod. Salmon. Scallops. Shrimp. Tuna. Tilapia. Clams. Oysters. Eggs.  Dairy: Low-fat milk. Cheese. Greek yogurt.  Beverages: Water. Red wine. Herbal tea.  Fats and oils: Extra virgin olive oil. Avocado oil. Grape seed oil.  Sweets and desserts: Greek yogurt with honey. Baked apples. Poached pears. Trail mix.  Seasoning and other foods: Basil. Cilantro. Coriander. Cumin. Mint. Parsley. Sage. Rosemary. Tarragon. Garlic. Oregano. Thyme. Pepper. Balsalmic vinegar. Tahini. Hummus. Tomato sauce. Olives. Mushrooms. ? Limit these  Grains: Prepackaged pasta or rice dishes. Prepackaged cereal with added sugar.  Vegetables: Deep fried potatoes (french fries).  Fruits: Fruit canned in syrup.  Meats and other protein foods: Beef. Pork. Lamb. Poultry with skin. Hot dogs. Bacon.  Dairy: Ice cream. Sour cream. Whole milk.  Beverages: Juice. Sugar-sweetened soft drinks. Beer. Liquor and spirits.  Fats and oils: Butter. Canola oil. Vegetable oil. Beef fat (tallow). Lard.  Sweets and desserts: Cookies. Cakes. Pies. Candy.  Seasoning and other foods: Mayonnaise. Premade sauces and marinades. The items listed may not be a complete list. Talk with your dietitian about what dietary choices are right for you. Summary  The Mediterranean diet includes both food and lifestyle choices.  Eat a variety of fresh fruits and vegetables, beans, nuts, seeds, and whole grains.  Limit the amount of red meat and sweets that you eat.  Talk with your health care provider about whether it is safe for you to drink red wine in moderation. This means 1 glass a day for nonpregnant women and 2 glasses a day for men. A glass of wine equals 5 oz (150 mL). This information  is not intended to replace advice given to you by your health care provider. Make sure you discuss any questions you have with your health care provider. Document Released: 08/31/2015 Document Revised: 09/07/2015 Document Reviewed: 08/31/2015 Elsevier Patient Education  2020 [employer]  

## 2018-07-23 LAB — RENAL FUNCTION PANEL
Albumin: 4.5 g/dL (ref 3.8–4.8)
BUN/Creatinine Ratio: 16 (ref 12–28)
BUN: 12 mg/dL (ref 8–27)
CO2: 26 mmol/L (ref 20–29)
Calcium: 9.8 mg/dL (ref 8.7–10.3)
Chloride: 99 mmol/L (ref 96–106)
Creatinine, Ser: 0.73 mg/dL (ref 0.57–1.00)
GFR calc Af Amer: 101 mL/min/{1.73_m2} (ref >59)
GFR calc non Af Amer: 87 mL/min/{1.73_m2} (ref >59)
Glucose: 81 mg/dL (ref 65–99)
Phosphorus: 3.9 mg/dL (ref 3.0–4.3)
Potassium: 4.2 mmol/L (ref 3.5–5.2)
Sodium: 140 mmol/L (ref 134–144)

## 2018-07-23 LAB — HEPATIC FUNCTION PANEL
ALT: 14 IU/L (ref 0–32)
AST: 18 IU/L (ref 0–40)
Alkaline Phosphatase: 66 IU/L (ref 39–117)
Bilirubin Total: 0.6 mg/dL (ref 0.0–1.2)
Bilirubin, Direct: 0.16 mg/dL (ref 0.00–0.40)
Total Protein: 6.6 g/dL (ref 6.0–8.5)

## 2018-07-23 LAB — LIPID PANEL WITH LDL/HDL RATIO
Cholesterol, Total: 193 mg/dL (ref 100–199)
HDL: 58 mg/dL (ref >39)
LDL Calculated: 117 mg/dL — ABNORMAL HIGH (ref 0–99)
LDl/HDL Ratio: 2 ratio (ref 0.0–3.2)
Triglycerides: 91 mg/dL (ref 0–149)
VLDL Cholesterol Cal: 18 mg/dL (ref 5–40)

## 2018-08-10 ENCOUNTER — Encounter: Payer: Self-pay | Admitting: Family Medicine

## 2018-08-10 ENCOUNTER — Ambulatory Visit (INDEPENDENT_AMBULATORY_CARE_PROVIDER_SITE_OTHER): Payer: 59 | Admitting: Family Medicine

## 2018-08-10 ENCOUNTER — Telehealth: Payer: Self-pay | Admitting: Gastroenterology

## 2018-08-10 ENCOUNTER — Other Ambulatory Visit: Payer: Self-pay

## 2018-08-10 VITALS — BP 130/70 | HR 88 | Ht 69.0 in | Wt 232.0 lb

## 2018-08-10 DIAGNOSIS — Z800 Family history of malignant neoplasm of digestive organs: Secondary | ICD-10-CM

## 2018-08-10 DIAGNOSIS — N3090 Cystitis, unspecified without hematuria: Secondary | ICD-10-CM

## 2018-08-10 LAB — POCT URINALYSIS DIPSTICK
Bilirubin, UA: NEGATIVE
Glucose, UA: NEGATIVE
Ketones, UA: NEGATIVE
Nitrite, UA: NEGATIVE
Protein, UA: NEGATIVE
Spec Grav, UA: 1.01 (ref 1.010–1.025)
Urobilinogen, UA: 0.2 E.U./dL
pH, UA: 5 (ref 5.0–8.0)

## 2018-08-10 MED ORDER — FLUCONAZOLE 150 MG PO TABS: 150.0000 mg | ORAL_TABLET | Freq: Once | ORAL | 0 refills | Status: AC

## 2018-08-10 MED ORDER — NITROFURANTOIN MONOHYD MACRO 100 MG PO CAPS: 100.0000 mg | ORAL_CAPSULE | Freq: Two times a day (BID) | ORAL | 0 refills | Status: DC

## 2018-08-10 NOTE — Telephone Encounter (Signed)
Faxed colonoscopy report and pathology to Dr. Adah Salvage office per request.

## 2018-08-10 NOTE — Progress Notes (Signed)
Date:  08/10/2018   Name:  Katrina Crawford   DOB:  March 13, 1955   MRN:  [id number]   Chief Complaint: Urinary Tract Infection (started x 2 weeks ago with a pain before urinating. took AZO x 4 days- hasn't helped. Has been off AZO x 4 days)  Urinary Tract Infection  This is a new problem. The current episode started in the past 7 days. The problem occurs intermittently. The problem has been gradually worsening. The quality of the pain is described as burning. The pain is moderate. Associated symptoms include frequency and urgency. Pertinent negatives include no chills, discharge, flank pain, hematuria, hesitancy, nausea, sweats or vomiting. Treatments tried: infamous Azo. The treatment provided mild relief. There is no history of recurrent UTIs.    Review of Systems  Constitutional: Negative.  Negative for chills, fatigue, fever and unexpected weight change.  HENT: Negative for congestion, ear discharge, ear pain, rhinorrhea, sinus pressure, sneezing and sore throat.   Eyes: Negative for photophobia, pain, discharge, redness and itching.  Respiratory: Negative for cough, shortness of breath, wheezing and stridor.   Gastrointestinal: Negative for abdominal pain, blood in stool, constipation, diarrhea, nausea and vomiting.  Endocrine: Negative for cold intolerance, heat intolerance, polydipsia, polyphagia and polyuria.  Genitourinary: Positive for frequency and urgency. Negative for decreased urine volume, dysuria, flank pain, hematuria, hesitancy, menstrual problem, pelvic pain, vaginal bleeding and vaginal discharge.  Musculoskeletal: Negative for arthralgias, back pain and myalgias.  Skin: Negative for rash.  Allergic/Immunologic: Negative for environmental allergies and food allergies.  Neurological: Negative for dizziness, weakness, light-headedness, numbness and headaches.  Hematological: Negative for adenopathy. Does not bruise/bleed easily.  Psychiatric/Behavioral: Negative for dysphoric  mood. The patient is not nervous/anxious.     Patient Active Problem List   Diagnosis Date Noted  . Essential hypertension 05/02/2016  . Mixed hyperlipidemia 05/02/2016  . HSV-1 infection 05/02/2016    Allergies  Allergen Reactions  . Penicillins   . Sulfa Antibiotics   . Codeine Nausea Only    Past Surgical History:  Procedure Laterality Date  . APPENDECTOMY    . COLONOSCOPY  08/2013   cleared for 5 yrs- Dr Allen Norris  . REPLACEMENT TOTAL KNEE Left 09/17/2017  . VAGINAL HYSTERECTOMY      Social History   Tobacco Use  . Smoking status: Never Smoker  . Smokeless tobacco: Never Used  Substance Use Topics  . Alcohol use: No    Alcohol/week: 0.0 standard drinks  . Drug use: No     Medication list has been reviewed and updated.  Current Meds  Medication Sig  . aspirin 81 MG tablet Take 81 mg by mouth daily.  . hydrochlorothiazide (HYDRODIURIL) 25 MG tablet TAKE 1 TABLET DAILY  . L-Lysine 1000 MG TABS Take 2 tablets (2,000 mg total) by mouth daily. otc  . meloxicam (MOBIC) 15 MG tablet Take 15 mg by mouth daily. Dr Reche Dixon  . simvastatin (ZOCOR) 40 MG tablet TAKE 1 TABLET DAILY  . valACYclovir (VALTREX) 1000 MG tablet Take 1 tablet (1,000 mg total) by mouth 2 (two) times daily. As needed for breakout    [facility] 2/9 Scores 08/10/2018 07/22/2018 07/01/2017 10/25/2016  PHQ - 2 Score 0 0 0 0  PHQ- 9 Score 0 0 0 0    BP Readings from Last 3 Encounters:  08/10/18 130/70  07/22/18 122/80  12/31/17 132/78    Physical Exam Vitals signs and nursing note reviewed.  Constitutional:      General: She is not  in acute distress.    Appearance: She is not diaphoretic.  HENT:     Head: Normocephalic and atraumatic.     Right Ear: External ear normal.     Left Ear: External ear normal.     Nose: Nose normal.  Eyes:     General:        Right eye: No discharge.        Left eye: No discharge.     Conjunctiva/sclera: Conjunctivae normal.     Pupils: Pupils are equal, round, and  reactive to light.  Neck:     Musculoskeletal: Normal range of motion and neck supple.     Thyroid: No thyromegaly.     Vascular: No JVD.  Cardiovascular:     Rate and Rhythm: Normal rate and regular rhythm.     Heart sounds: Normal heart sounds. No murmur. No friction rub. No gallop.   Pulmonary:     Effort: Pulmonary effort is normal.     Breath sounds: Normal breath sounds.  Abdominal:     General: Bowel sounds are normal.     Palpations: Abdomen is soft. There is no hepatomegaly, splenomegaly or mass.     Tenderness: There is abdominal tenderness in the suprapubic area. There is no right CVA tenderness, left CVA tenderness, guarding or rebound.  Musculoskeletal: Normal range of motion.  Lymphadenopathy:     Cervical: No cervical adenopathy.  Skin:    General: Skin is warm and dry.  Neurological:     Mental Status: She is alert.     Deep Tendon Reflexes: Reflexes are normal and symmetric.     Wt Readings from Last 3 Encounters:  08/10/18 232 lb (105.2 kg)  07/22/18 234 lb (106.1 kg)  12/31/17 233 lb (105.7 kg)    BP 130/70   Pulse 88   Ht 5\' 9"  (1.753 m)   Wt 232 lb (105.2 kg)   BMI 34.26 kg/m   Assessment and Plan:  1. Cystitis Acute.  Initiate Macrobid 100 mg twice a day for 5 days.  Also probably has a exacerbation of a yeast vulvovaginitis for which we will cover with fluconazole 150 mg once a day. - fluconazole (DIFLUCAN) 150 MG tablet; Take 1 tablet (150 mg total) by mouth once for 1 dose.  Dispense: 1 tablet; Refill: 0 - nitrofurantoin, macrocrystal-monohydrate, (MACROBID) 100 MG capsule; Take 1 capsule (100 mg total) by mouth 2 (two) times daily.  Dispense: 10 capsule; Refill: 0 - POCT urinalysis dipstick

## 2018-08-10 NOTE — Telephone Encounter (Signed)
Tara from Dr. Adah Salvage office is calling to obtain Report from Procedure done 09/17/2013 check old system please also she needs to know when pot is due for next one 5 years or 10 years after last one call back number  [phone] and fax Number [phone]

## 2018-08-11 NOTE — Addendum Note (Signed)
Addended by: Fredderick Severance on: 08/11/2018 04:32 PM   Modules accepted: Orders

## 2018-08-18 ENCOUNTER — Telehealth: Payer: Self-pay

## 2018-08-18 ENCOUNTER — Other Ambulatory Visit: Payer: Self-pay

## 2018-08-18 ENCOUNTER — Encounter: Payer: Self-pay | Admitting: *Deleted

## 2018-08-18 ENCOUNTER — Telehealth: Payer: Self-pay | Admitting: Gastroenterology

## 2018-08-18 DIAGNOSIS — N3090 Cystitis, unspecified without hematuria: Secondary | ICD-10-CM

## 2018-08-18 MED ORDER — NA SULFATE-K SULFATE-MG SULF 17.5-3.13-1.6 GM/177ML PO SOLN: 1.0000 | Freq: Once | ORAL | 0 refills | Status: AC

## 2018-08-18 MED ORDER — CIPROFLOXACIN HCL 250 MG PO TABS: 250.0000 mg | ORAL_TABLET | Freq: Two times a day (BID) | ORAL | 0 refills | Status: DC

## 2018-08-18 NOTE — Progress Notes (Unsigned)
Sent in Cipro 250mg  BID x 3 day

## 2018-08-18 NOTE — Telephone Encounter (Signed)
BMI 34.56 

## 2018-08-18 NOTE — Telephone Encounter (Signed)
Pt called stating she finished Macrobid on 25th- felt better, but it started coming back (UTI). Sent in 3 days of Cipro and will recheck if not better

## 2018-08-18 NOTE — Telephone Encounter (Signed)
Gastroenterology Pre-Procedure Review  Request Date:  Friday 09/04/2018  [place] Requesting Physician: Dr. Allen Norris  PATIENT REVIEW QUESTIONS: The patient responded to the following health history questions as indicated:    1. Are you having any GI issues? no 2. Do you have a personal history of Polyps? yes ( 5 yrs ago) 3. Do you have a family history of Colon Cancer or Polyps? yes (Mother & Grandmother colon cancer) 4. Diabetes Mellitus? no 5. Joint replacements in the past 12 months?yes (Left Knee 10/2017) 6. Major health problems in the past 3 months?no 7. Any artificial heart valves, MVP, or defibrillator?no    MEDICATIONS & ALLERGIES:    Patient reports the following regarding taking any anticoagulation/antiplatelet therapy:   Plavix, Coumadin, Eliquis, Xarelto, Lovenox, Pradaxa, Brilinta, or Effient? no Aspirin? yes (81 mg)  Patient confirms/reports the following medications:  Current Outpatient Medications  Medication Sig Dispense Refill  . aspirin 81 MG tablet Take 81 mg by mouth daily.    . ciprofloxacin (CIPRO) 250 MG tablet Take 1 tablet (250 mg total) by mouth 2 (two) times daily. 6 tablet 0  . hydrochlorothiazide (HYDRODIURIL) 25 MG tablet TAKE 1 TABLET DAILY 90 tablet 0  . L-Lysine 1000 MG TABS Take 2 tablets (2,000 mg total) by mouth daily. otc 100 tablet 3  . meloxicam (MOBIC) 15 MG tablet Take 15 mg by mouth daily. Dr Reche Dixon    . simvastatin (ZOCOR) 40 MG tablet TAKE 1 TABLET DAILY 90 tablet 0  . valACYclovir (VALTREX) 1000 MG tablet Take 1 tablet (1,000 mg total) by mouth 2 (two) times daily. As needed for breakout 20 tablet 1   No current facility-administered medications for this visit.     Patient confirms/reports the following allergies:  Allergies  Allergen Reactions  . Penicillins   . Sulfa Antibiotics   . Codeine Nausea Only    No orders of the defined types were placed in this encounter.   AUTHORIZATION INFORMATION Primary Insurance: 1D#: Group  #:  Secondary Insurance: 1D#: Group #:  SCHEDULE INFORMATION: Date:  Time: Location:

## 2018-08-19 ENCOUNTER — Other Ambulatory Visit: Payer: Self-pay

## 2018-08-19 DIAGNOSIS — Z800 Family history of malignant neoplasm of digestive organs: Secondary | ICD-10-CM

## 2018-08-26 ENCOUNTER — Other Ambulatory Visit: Payer: Self-pay

## 2018-08-26 ENCOUNTER — Encounter: Payer: Self-pay | Admitting: *Deleted

## 2018-09-01 ENCOUNTER — Other Ambulatory Visit: Payer: Self-pay

## 2018-09-01 ENCOUNTER — Other Ambulatory Visit
Admission: RE | Admit: 2018-09-01 | Discharge: 2018-09-01 | Disposition: A | Discharge status: Home / Self Care | Payer: Medicare Other | Source: Ambulatory Visit | Attending: Gastroenterology | Admitting: Gastroenterology

## 2018-09-01 DIAGNOSIS — Z20828 Contact with and (suspected) exposure to other viral communicable diseases: Secondary | ICD-10-CM | POA: Insufficient documentation

## 2018-09-01 DIAGNOSIS — Z01812 Encounter for preprocedural laboratory examination: Secondary | ICD-10-CM | POA: Diagnosis present

## 2018-09-01 LAB — SARS CORONAVIRUS 2 (TAT 6-24 HRS): SARS Coronavirus 2: NEGATIVE

## 2018-09-03 NOTE — Discharge Instructions (Signed)

## 2018-09-04 ENCOUNTER — Encounter
Admission: RE | Disposition: A | Discharge status: Home / Self Care | Payer: Self-pay | Source: Home / Self Care | Attending: Gastroenterology

## 2018-09-04 ENCOUNTER — Ambulatory Visit: Payer: Medicare Other | Admitting: Anesthesiology

## 2018-09-04 ENCOUNTER — Ambulatory Visit
Admission: RE | Admit: 2018-09-04 | Discharge: 2018-09-04 | Disposition: A | Discharge status: Home / Self Care | Payer: Medicare Other | Attending: Gastroenterology | Admitting: Gastroenterology

## 2018-09-04 ENCOUNTER — Other Ambulatory Visit: Payer: Self-pay

## 2018-09-04 DIAGNOSIS — I10 Essential (primary) hypertension: Secondary | ICD-10-CM | POA: Insufficient documentation

## 2018-09-04 DIAGNOSIS — K5730 Diverticulosis of large intestine without perforation or abscess without bleeding: Secondary | ICD-10-CM | POA: Insufficient documentation

## 2018-09-04 DIAGNOSIS — K641 Second degree hemorrhoids: Secondary | ICD-10-CM | POA: Insufficient documentation

## 2018-09-04 DIAGNOSIS — E785 Hyperlipidemia, unspecified: Secondary | ICD-10-CM | POA: Insufficient documentation

## 2018-09-04 DIAGNOSIS — Z7982 Long term (current) use of aspirin: Secondary | ICD-10-CM | POA: Insufficient documentation

## 2018-09-04 DIAGNOSIS — M1990 Unspecified osteoarthritis, unspecified site: Secondary | ICD-10-CM | POA: Insufficient documentation

## 2018-09-04 DIAGNOSIS — Z800 Family history of malignant neoplasm of digestive organs: Secondary | ICD-10-CM | POA: Diagnosis not present

## 2018-09-04 DIAGNOSIS — Z1211 Encounter for screening for malignant neoplasm of colon: Secondary | ICD-10-CM | POA: Diagnosis not present

## 2018-09-04 DIAGNOSIS — Z791 Long term (current) use of non-steroidal anti-inflammatories (NSAID): Secondary | ICD-10-CM | POA: Insufficient documentation

## 2018-09-04 HISTORY — PX: COLONOSCOPY WITH PROPOFOL: SHX5780

## 2018-09-04 HISTORY — DX: Encounter for fitting and adjustment of orthodontic device: Z46.4

## 2018-09-04 HISTORY — DX: Unspecified osteoarthritis, unspecified site: M19.90

## 2018-09-04 HISTORY — DX: Reserved for inherently not codable concepts without codable children: IMO0001

## 2018-09-04 SURGERY — COLONOSCOPY WITH PROPOFOL
Anesthesia: General | Site: Rectum

## 2018-09-04 MED ORDER — OXYCODONE HCL 5 MG/5ML PO SOLN: 5.0000 mg | Freq: Once | ORAL | Status: DC | PRN

## 2018-09-04 MED ORDER — PROPOFOL 10 MG/ML IV BOLUS
INTRAVENOUS | Status: DC | PRN
  Administered 2018-09-04: 20 mg via INTRAVENOUS
  Administered 2018-09-04: 80 mg via INTRAVENOUS
  Administered 2018-09-04: 20 mg via INTRAVENOUS
  Administered 2018-09-04: 30 mg via INTRAVENOUS
  Administered 2018-09-04 (×3): 20 mg via INTRAVENOUS
  Administered 2018-09-04: 30 mg via INTRAVENOUS
  Administered 2018-09-04: 20 mg via INTRAVENOUS

## 2018-09-04 MED ORDER — OXYCODONE HCL 5 MG PO TABS: 5.0000 mg | ORAL_TABLET | Freq: Once | ORAL | Status: DC | PRN

## 2018-09-04 MED ORDER — FENTANYL CITRATE (PF) 100 MCG/2ML IJ SOLN: 25.0000 ug | INTRAMUSCULAR | Status: DC | PRN

## 2018-09-04 MED ORDER — PROMETHAZINE HCL 25 MG/ML IJ SOLN: 6.2500 mg | INTRAMUSCULAR | Status: DC | PRN

## 2018-09-04 MED ORDER — STERILE WATER FOR IRRIGATION IR SOLN
Status: DC | PRN
  Administered 2018-09-04: .05 mL

## 2018-09-04 MED ORDER — MEPERIDINE HCL 25 MG/ML IJ SOLN: 6.2500 mg | INTRAMUSCULAR | Status: DC | PRN

## 2018-09-04 MED ORDER — LIDOCAINE HCL (CARDIAC) PF 100 MG/5ML IV SOSY
PREFILLED_SYRINGE | INTRAVENOUS | Status: DC | PRN
  Administered 2018-09-04: 50 mg via INTRAVENOUS

## 2018-09-04 MED ORDER — LACTATED RINGERS IV SOLN
10.0000 mL/h | INTRAVENOUS | Status: DC
  Administered 2018-09-04: 11:00:00 via INTRAVENOUS

## 2018-09-04 SURGICAL SUPPLY — 5 items
CANISTER SUCT 1200ML W/VALVE (MISCELLANEOUS) ×2 IMPLANT
GOWN CVR UNV OPN BCK APRN NK (MISCELLANEOUS) ×2 IMPLANT
GOWN ISOL THUMB LOOP REG UNIV (MISCELLANEOUS) ×2
KIT ENDO PROCEDURE OLY (KITS) ×2 IMPLANT
WATER STERILE IRR 250ML POUR (IV SOLUTION) ×2 IMPLANT

## 2018-09-04 NOTE — Anesthesia Preprocedure Evaluation (Signed)
Anesthesia Evaluation  Patient identified by MRN, date of birth, ID band Patient awake    Reviewed: Allergy & Precautions, H&P , NPO status , Patient's Chart, lab work & pertinent test results, reviewed documented beta blocker date and time   Airway Mallampati: II  TM Distance: >3 FB Neck ROM: full    Dental no notable dental hx.    Pulmonary neg pulmonary ROS,    Pulmonary exam normal breath sounds clear to auscultation       Cardiovascular Exercise Tolerance: Good hypertension,  Rhythm:regular Rate:Normal     Neuro/Psych negative neurological ROS  negative psych ROS   GI/Hepatic negative GI ROS, Neg liver ROS,   Endo/Other  negative endocrine ROS  Renal/GU negative Renal ROS  negative genitourinary   Musculoskeletal   Abdominal   Peds  Hematology negative hematology ROS (+)   Anesthesia Other Findings   Reproductive/Obstetrics negative OB ROS                             Anesthesia Physical Anesthesia Plan  ASA: II  Anesthesia Plan: General   Post-op Pain Management:    Induction:   PONV Risk Score and Plan:   Airway Management Planned:   Additional Equipment:   Intra-op Plan:   Post-operative Plan:   Informed Consent: I have reviewed the patients History and Physical, chart, labs and discussed the procedure including the risks, benefits and alternatives for the proposed anesthesia with the patient or authorized representative who has indicated his/her understanding and acceptance.     Dental Advisory Given  Plan Discussed with: CRNA  Anesthesia Plan Comments:         Anesthesia Quick Evaluation  

## 2018-09-04 NOTE — Transfer of Care (Signed)
Immediate Anesthesia Transfer of Care Note  Patient: Katrina Crawford  Procedure(s) Performed: COLONOSCOPY WITH PROPOFOL (N/A Rectum)  Patient Location: PACU  Anesthesia Type: General  Level of Consciousness: awake, alert  and patient cooperative  Airway and Oxygen Therapy: Patient Spontanous Breathing and Patient connected to supplemental oxygen  Post-op Assessment: Post-op Vital signs reviewed, Patient's Cardiovascular Status Stable, Respiratory Function Stable, Patent Airway and No signs of Nausea or vomiting  Post-op Vital Signs: Reviewed and stable  Complications: No apparent anesthesia complications

## 2018-09-04 NOTE — H&P (Signed)
Katrina Miniumarren Jacqueli Pangallo, MD [facility] [address] [place], [place] [address] Phone:[phone] Fax : [phone]  Primary Care Physician:  Katrina LimerickJones, Deanna C, MD Primary Gastroenterologist:  Dr. Servando Crawford  Pre-Procedure History & Physical: HPI:  Katrina Crawford is a 63 y.o. female is here for an colonoscopy.   Past Medical History:  Diagnosis Date  . Arthritis    knees, lower back  . Hyperlipidemia   . Hypertension   . Orthodontics    invisalign    Past Surgical History:  Procedure Laterality Date  . APPENDECTOMY    . COLONOSCOPY  08/2013   cleared for 5 yrs- Dr Katrina Crawford  . REPLACEMENT TOTAL KNEE Left 09/17/2017  . VAGINAL HYSTERECTOMY      Prior to Admission medications   Medication Sig Start Date End Date Taking? Authorizing Provider  aspirin 81 MG tablet Take 81 mg by mouth daily.   Yes [provider]  hydrochlorothiazide (HYDRODIURIL) 25 MG tablet TAKE 1 TABLET DAILY 04/27/18  Yes Katrina LimerickJones, Deanna C, MD  L-Lysine 1000 MG TABS Take 2 tablets (2,000 mg total) by mouth daily. otc 07/01/17  Yes Katrina LimerickJones, Deanna C, MD  meloxicam (MOBIC) 15 MG tablet Take 15 mg by mouth daily. Dr Dedra Skeensodd Mundy   Yes [provider]  simvastatin (ZOCOR) 40 MG tablet TAKE 1 TABLET DAILY 07/17/18  Yes Katrina LimerickJones, Deanna C, MD  valACYclovir (VALTREX) 1000 MG tablet Take 1 tablet (1,000 mg total) by mouth 2 (two) times daily. As needed for breakout 07/22/18  Yes Katrina LimerickJones, Deanna C, MD    Allergies as of 08/19/2018 - Review Complete 08/10/2018  Allergen Reaction Noted  . Penicillins  12/09/2014  . Sulfa antibiotics  12/09/2014  . Codeine Nausea Only 12/23/2014    Family History  Problem Relation Age of Onset  . Cancer Mother   . Hypertension Father   . Breast cancer Sister 1257    Social History   Socioeconomic History  . Marital status: Married    Spouse name: Not on file  . Number of children: Not on file  . Years of education: Not on file  . Highest education level: Not on file  Occupational  History  . Not on file  Social Needs  . Financial resource strain: Not on file  . Food insecurity    Worry: Not on file    Inability: Not on file  . Transportation needs    Medical: Not on file    Non-medical: Not on file  Tobacco Use  . Smoking status: Never Smoker  . Smokeless tobacco: Never Used  Substance and Sexual Activity  . Alcohol use: Yes    Alcohol/week: 18.0 standard drinks    Types: 18 Cans of beer per week  . Drug use: No  . Sexual activity: Yes  Lifestyle  . Physical activity    Days per week: Not on file    Minutes per session: Not on file  . Stress: Not on file  Relationships  . Social Musicianconnections    Talks on phone: Not on file    Gets together: Not on file    Attends religious service: Not on file    Active member of club or organization: Not on file    Attends meetings of clubs or organizations: Not on file    Relationship status: Not on file  . Intimate partner violence    Fear of current or ex partner: Not on file    Emotionally abused: Not on file  Physically abused: Not on file    Forced sexual activity: Not on file  Other Topics Concern  . Not on file  Social History Narrative  . Not on file    Review of Systems: See HPI, otherwise negative ROS  Physical Exam: BP (!) 159/99   Pulse 96   Temp 97.7 F (36.5 Crawford)   Ht 5\' 8"  (1.727 m)   Wt 101.2 kg   SpO2 97%   BMI 33.91 kg/m  General:   Alert,  pleasant and cooperative in NAD Head:  Normocephalic and atraumatic. Neck:  Supple; no masses or thyromegaly. Lungs:  Clear throughout to auscultation.    Heart:  Regular rate and rhythm. Abdomen:  Soft, nontender and nondistended. Normal bowel sounds, without guarding, and without rebound.   Neurologic:  Alert and  oriented x4;  grossly normal neurologically.  Impression/Plan: Katrina Crawford is here for an colonoscopy to be performed for mother with rectal cancer  Risks, benefits, limitations, and alternatives regarding  colonoscopy have  been reviewed with the patient.  Questions have been answered.  All parties agreeable.   Lucilla Lame, MD  09/04/2018, 10:13 AM

## 2018-09-04 NOTE — Anesthesia Procedure Notes (Signed)
Performed by: Paizley Ramella, CRNA Pre-anesthesia Checklist: Patient identified, Emergency Drugs available, Suction available, Timeout performed and Patient being monitored Patient Re-evaluated:Patient Re-evaluated prior to induction Oxygen Delivery Method: Nasal cannula Placement Confirmation: positive ETCO2       

## 2018-09-04 NOTE — Anesthesia Postprocedure Evaluation (Signed)
Anesthesia Post Note  Patient: Katrina Crawford  Procedure(s) Performed: COLONOSCOPY WITH PROPOFOL (N/A Rectum)  Patient location during evaluation: PACU Anesthesia Type: General Level of consciousness: awake and alert Pain management: pain level controlled Vital Signs Assessment: post-procedure vital signs reviewed and stable Respiratory status: spontaneous breathing, nonlabored ventilation, respiratory function stable and patient connected to nasal cannula oxygen Cardiovascular status: blood pressure returned to baseline and stable Postop Assessment: no apparent nausea or vomiting Anesthetic complications: no    Adalay Azucena ELAINE

## 2018-09-04 NOTE — Op Note (Signed)
[facility] Gastroenterology Patient Name: Katrina SpinnerYvonne Crawford Procedure Date: 09/04/2018 10:45 AM MRN: [id number] Account #: [id number] Date of Birth: 02/23/1955 Admit Type: Outpatient Age: 63 [facility] Gender: Female Note Status: Finalized Procedure:            Colonoscopy Indications:          Screening in patient at increased risk: Family history                        of 1st-degree relative with colorectal cancer Providers:            Midge Miniumarren Juliocesar Blasius MD, MD Referring MD:         Duanne Limerickeanna C. Jones, MD (Referring MD) Medicines:            Propofol per Anesthesia Complications:        No immediate complications. Procedure:            Pre-Anesthesia Assessment:                       - Prior to the procedure, a History and Physical was                        performed, and patient medications and allergies were                        reviewed. The patient's tolerance of previous                        anesthesia was also reviewed. The risks and benefits of                        the procedure and the sedation options and risks were                        discussed with the patient. All questions were                        answered, and informed consent was obtained. Prior                        Anticoagulants: The patient has taken no previous                        anticoagulant or antiplatelet agents. ASA Grade                        Assessment: II - A patient with mild systemic disease.                        After reviewing the risks and benefits, the patient was                        deemed in satisfactory condition to undergo the                        procedure.                       After obtaining informed consent, the colonoscope was  passed under direct vision. Throughout the procedure,                        the patient's blood pressure, pulse, and oxygen                        saturations were monitored continuously. The was                      introduced through the anus and advanced to the the                        cecum, identified by appendiceal orifice and ileocecal                        valve. The colonoscopy was performed without                        difficulty. The patient tolerated the procedure well.                        The quality of the bowel preparation was excellent. Findings:      The perianal and digital rectal examinations were normal.      Non-bleeding internal hemorrhoids were found during retroflexion. The       hemorrhoids were Grade II (internal hemorrhoids that prolapse but reduce       spontaneously).      A few small-mouthed diverticula were found in the sigmoid colon. Impression:           - Non-bleeding internal hemorrhoids.                       - Diverticulosis in the sigmoid colon.                       - No specimens collected. Recommendation:       - Discharge patient to home.                       - Resume previous diet.                       - Continue present medications.                       - Repeat colonoscopy in 5 years for surveillance. Procedure Code(s):    --- Professional ---                       [id number], Colonoscopy, flexible; diagnostic, including                        collection of specimen(s) by brushing or washing, when                        performed (separate procedure) Diagnosis Code(s):    --- Professional ---                       Z80.0, Family history of malignant neoplasm of                        digestive organs CPT copyright  2019 American Medical Association. All rights reserved. The codes documented in this report are preliminary and upon coder review may  be revised to meet current compliance requirements. Midge Miniumarren Ancelmo Hunt MD, MD 09/04/2018 11:10:14 AM This report has been signed electronically. Number of Addenda: 0 Note Initiated On: 09/04/2018 10:45 AM Scope Withdrawal Time: 0 hours 6 minutes 44 seconds  Total Procedure Duration: 0 hours 10  minutes 13 seconds  Estimated Blood Loss: Estimated blood loss: none.      [facility]

## 2018-09-07 ENCOUNTER — Encounter: Payer: Self-pay | Admitting: Gastroenterology

## 2018-09-18 ENCOUNTER — Other Ambulatory Visit: Payer: Self-pay | Admitting: Family Medicine

## 2018-09-18 DIAGNOSIS — I10 Essential (primary) hypertension: Secondary | ICD-10-CM

## 2018-09-30 ENCOUNTER — Other Ambulatory Visit: Payer: Self-pay | Admitting: Family Medicine

## 2018-09-30 DIAGNOSIS — I10 Essential (primary) hypertension: Secondary | ICD-10-CM

## 2018-10-19 ENCOUNTER — Other Ambulatory Visit: Payer: Self-pay

## 2018-10-27 ENCOUNTER — Telehealth: Payer: Self-pay | Admitting: Family Medicine

## 2018-10-27 ENCOUNTER — Other Ambulatory Visit: Payer: Self-pay

## 2018-10-27 DIAGNOSIS — E782 Mixed hyperlipidemia: Secondary | ICD-10-CM

## 2018-10-27 MED ORDER — SIMVASTATIN 40 MG PO TABS: 40.0000 mg | ORAL_TABLET | Freq: Every day | ORAL | 0 refills | Status: DC

## 2018-10-27 NOTE — Telephone Encounter (Signed)
Patient needs refill for simvastatin (ZOCOR) 40 MG tablet [[id number]]  [facility] [place], [place] to Registered [employer] Sites

## 2018-11-20 ENCOUNTER — Ambulatory Visit (INDEPENDENT_AMBULATORY_CARE_PROVIDER_SITE_OTHER): Payer: Medicare Other | Admitting: Family Medicine

## 2018-11-20 ENCOUNTER — Encounter: Payer: Self-pay | Admitting: Family Medicine

## 2018-11-20 VITALS — BP 138/80 | HR 100 | Temp 98.7°F | Ht 68.0 in | Wt 229.0 lb

## 2018-11-20 DIAGNOSIS — Z23 Encounter for immunization: Secondary | ICD-10-CM | POA: Diagnosis not present

## 2018-11-20 DIAGNOSIS — J0100 Acute maxillary sinusitis, unspecified: Secondary | ICD-10-CM | POA: Diagnosis not present

## 2018-11-20 MED ORDER — AZITHROMYCIN 250 MG PO TABS: ORAL_TABLET | ORAL | 0 refills | Status: DC

## 2018-11-20 NOTE — Progress Notes (Signed)
Date:  11/20/2018   Name:  Katrina Crawford   DOB:  03/25/1955   MRN:  [id number]   Chief Complaint: Sinusitis (covid neg on 11/15/2018- sore throat) and pneum vacc need (13)  Sinusitis This is a new problem. The current episode started in the past 7 days. The problem has been gradually worsening since onset. There has been no fever. The pain is moderate. Associated symptoms include congestion and sinus pressure. Pertinent negatives include no chills, coughing, diaphoresis, ear pain, headaches, hoarse voice, neck pain, shortness of breath, sneezing, sore throat or swollen glands. (Postnasal drainage/) Past treatments include acetaminophen (claritin). The treatment provided mild relief.    Review of Systems  Constitutional: Negative.  Negative for chills, diaphoresis, fatigue, fever and unexpected weight change.  HENT: Positive for congestion and sinus pressure. Negative for ear discharge, ear pain, hoarse voice, rhinorrhea, sneezing and sore throat.   Eyes: Negative for photophobia, pain, discharge, redness and itching.  Respiratory: Negative for cough, shortness of breath, wheezing and stridor.   Gastrointestinal: Negative for abdominal pain, blood in stool, constipation, diarrhea, nausea and vomiting.  Endocrine: Negative for cold intolerance, heat intolerance, polydipsia, polyphagia and polyuria.  Genitourinary: Negative for dysuria, flank pain, frequency, hematuria, menstrual problem, pelvic pain, urgency, vaginal bleeding and vaginal discharge.  Musculoskeletal: Negative for arthralgias, back pain, myalgias and neck pain.  Skin: Negative for rash.  Allergic/Immunologic: Negative for environmental allergies and food allergies.  Neurological: Negative for dizziness, weakness, light-headedness, numbness and headaches.  Hematological: Negative for adenopathy. Does not bruise/bleed easily.  Psychiatric/Behavioral: Negative for dysphoric mood. The patient is not nervous/anxious.      Patient Active Problem List   Diagnosis Date Noted  . Family history of malignant neoplasm of gastrointestinal tract   . Essential hypertension 05/02/2016  . Mixed hyperlipidemia 05/02/2016  . HSV-1 infection 05/02/2016    Allergies  Allergen Reactions  . Codeine Nausea Only  . Penicillins Rash  . Sulfa Antibiotics Rash    Past Surgical History:  Procedure Laterality Date  . APPENDECTOMY    . COLONOSCOPY  08/2013   cleared for 5 yrs- Dr Servando Snare  . COLONOSCOPY WITH PROPOFOL N/A 09/04/2018   Procedure: COLONOSCOPY WITH PROPOFOL;  Surgeon: Midge Minium, MD;  Location: [facility];  Service: Endoscopy;  Laterality: N/A;  . REPLACEMENT TOTAL KNEE Left 09/17/2017  . VAGINAL HYSTERECTOMY      Social History   Tobacco Use  . Smoking status: Never Smoker  . Smokeless tobacco: Never Used  Substance Use Topics  . Alcohol use: Yes    Alcohol/week: 18.0 standard drinks    Types: 18 Cans of beer per week  . Drug use: No     Medication list has been reviewed and updated.  Current Meds  Medication Sig  . aspirin 81 MG tablet Take 81 mg by mouth daily.  . hydrochlorothiazide (HYDRODIURIL) 25 MG tablet TAKE 1 TABLET DAILY  . L-Lysine 1000 MG TABS Take 2 tablets (2,000 mg total) by mouth daily. otc  . meloxicam (MOBIC) 15 MG tablet Take 15 mg by mouth daily. Dr Dedra Skeens  . simvastatin (ZOCOR) 40 MG tablet Take 1 tablet (40 mg total) by mouth daily.  . valACYclovir (VALTREX) 1000 MG tablet Take 1 tablet (1,000 mg total) by mouth 2 (two) times daily. As needed for breakout    [facility] 2/9 Scores 08/10/2018 07/22/2018 07/01/2017 10/25/2016  PHQ - 2 Score 0 0 0 0  PHQ- 9 Score 0 0 0 0  BP Readings from Last 3 Encounters:  11/20/18 138/80  09/04/18 127/73  08/10/18 130/70    Physical Exam Vitals signs and nursing note reviewed.  Constitutional:      General: She is not in acute distress.    Appearance: She is not diaphoretic.  HENT:     Head: Normocephalic and atraumatic.      Jaw: There is normal jaw occlusion.     Right Ear: Tympanic membrane, ear canal and external ear normal.     Left Ear: Tympanic membrane, ear canal and external ear normal.     Nose: No nasal tenderness, mucosal edema, congestion or rhinorrhea.     Right Turbinates: Not enlarged or swollen.     Left Turbinates: Not enlarged or swollen.     Right Sinus: Maxillary sinus tenderness and frontal sinus tenderness present.     Left Sinus: Maxillary sinus tenderness and frontal sinus tenderness present.     Mouth/Throat:     Lips: Pink.     Mouth: Mucous membranes are moist.     Tongue: No lesions. Tongue does not deviate from midline.     Palate: No mass and lesions.     Pharynx: Oropharynx is clear. Uvula midline.  Eyes:     General:        Right eye: No discharge.        Left eye: No discharge.     Conjunctiva/sclera: Conjunctivae normal.     Pupils: Pupils are equal, round, and reactive to light.  Neck:     Musculoskeletal: Normal range of motion and neck supple.     Thyroid: No thyromegaly.     Vascular: No JVD.  Cardiovascular:     Rate and Rhythm: Normal rate and regular rhythm.     Pulses: Normal pulses.     Heart sounds: Normal heart sounds. No murmur. No friction rub. No gallop.   Pulmonary:     Effort: Pulmonary effort is normal.     Breath sounds: Normal breath sounds. No wheezing, rhonchi or rales.  Abdominal:     General: Bowel sounds are normal.     Palpations: Abdomen is soft. There is no mass.     Tenderness: There is no abdominal tenderness. There is no guarding.  Musculoskeletal: Normal range of motion.  Lymphadenopathy:     Cervical: No cervical adenopathy.  Skin:    General: Skin is warm and dry.     Findings: No erythema.  Neurological:     Mental Status: She is alert.     Deep Tendon Reflexes: Reflexes are normal and symmetric.     Wt Readings from Last 3 Encounters:  11/20/18 229 lb (103.9 kg)  09/04/18 223 lb (101.2 kg)  08/10/18 232 lb (105.2  kg)    BP 138/80   Pulse 100   Temp 98.7 F (37.1 C) (Oral)   Ht 5\' 8"  (1.727 m)   Wt 229 lb (103.9 kg)   BMI 34.82 kg/m   Assessment and Plan: 1. Acute maxillary sinusitis, recurrence not specified Acute.  Persistent.  Patient has had a 1 week history of sinus congestion associated with sinus pressure and tenderness over the maxillary and frontal sinuses.  Patient has an acute sinusitis and we will treat with a azithromycin to 50 mg 2 today followed by 1 a day for 4 days. - azithromycin (ZITHROMAX) 250 MG tablet; 2 today then 1 a day for four days  Dispense: 6 tablet; Refill: 0  2. Need for pneumococcal vaccination  Discussed and administered - Pneumococcal conjugate vaccine 13-valent

## 2018-11-23 ENCOUNTER — Ambulatory Visit: Payer: Self-pay | Admitting: Family Medicine

## 2018-12-25 ENCOUNTER — Other Ambulatory Visit: Payer: Self-pay | Admitting: Physician Assistant

## 2018-12-25 ENCOUNTER — Other Ambulatory Visit (HOSPITAL_COMMUNITY): Payer: Self-pay | Admitting: Physician Assistant

## 2018-12-25 DIAGNOSIS — R439 Unspecified disturbances of smell and taste: Secondary | ICD-10-CM

## 2019-01-05 ENCOUNTER — Other Ambulatory Visit: Payer: Self-pay

## 2019-01-05 ENCOUNTER — Ambulatory Visit
Admission: RE | Admit: 2019-01-05 | Discharge: 2019-01-05 | Disposition: A | Discharge status: Home / Self Care | Payer: Medicare Other | Source: Ambulatory Visit | Attending: Physician Assistant | Admitting: Physician Assistant

## 2019-01-05 DIAGNOSIS — R439 Unspecified disturbances of smell and taste: Secondary | ICD-10-CM | POA: Diagnosis not present

## 2019-01-05 LAB — POCT I-STAT CREATININE: Creatinine, Ser: 0.8 mg/dL (ref 0.44–1.00)

## 2019-01-05 MED ORDER — GADOBUTROL 1 MMOL/ML IV SOLN
10.0000 mL | Freq: Once | INTRAVENOUS | Status: AC | PRN
  Administered 2019-01-05: 10 mL via INTRAVENOUS

## 2019-01-09 ENCOUNTER — Other Ambulatory Visit: Payer: Self-pay | Admitting: Family Medicine

## 2019-01-09 DIAGNOSIS — I10 Essential (primary) hypertension: Secondary | ICD-10-CM

## 2019-01-19 ENCOUNTER — Other Ambulatory Visit: Payer: Self-pay

## 2019-01-19 DIAGNOSIS — E782 Mixed hyperlipidemia: Secondary | ICD-10-CM

## 2019-01-19 MED ORDER — SIMVASTATIN 40 MG PO TABS: 40.0000 mg | ORAL_TABLET | Freq: Every day | ORAL | 0 refills | Status: DC

## 2019-02-08 ENCOUNTER — Other Ambulatory Visit: Payer: Self-pay

## 2019-02-08 ENCOUNTER — Ambulatory Visit (INDEPENDENT_AMBULATORY_CARE_PROVIDER_SITE_OTHER): Payer: Medicare Other | Admitting: Family Medicine

## 2019-02-08 ENCOUNTER — Encounter: Payer: Self-pay | Admitting: Family Medicine

## 2019-02-08 VITALS — BP 138/78 | HR 80 | Ht 68.0 in | Wt 222.0 lb

## 2019-02-08 DIAGNOSIS — I10 Essential (primary) hypertension: Secondary | ICD-10-CM | POA: Diagnosis not present

## 2019-02-08 DIAGNOSIS — E782 Mixed hyperlipidemia: Secondary | ICD-10-CM

## 2019-02-08 DIAGNOSIS — E538 Deficiency of other specified B group vitamins: Secondary | ICD-10-CM | POA: Diagnosis not present

## 2019-02-08 DIAGNOSIS — R439 Unspecified disturbances of smell and taste: Secondary | ICD-10-CM

## 2019-02-08 MED ORDER — HYDROCHLOROTHIAZIDE 25 MG PO TABS: 25.0000 mg | ORAL_TABLET | Freq: Every day | ORAL | 1 refills | Status: DC

## 2019-02-08 MED ORDER — SIMVASTATIN 40 MG PO TABS: 40.0000 mg | ORAL_TABLET | Freq: Every day | ORAL | 1 refills | Status: DC

## 2019-02-08 NOTE — Progress Notes (Signed)
Date:  02/08/2019   Name:  Katrina Crawford   DOB:  May 25, 1955   MRN:  [id number]   Chief Complaint: Hypertension, Hyperlipidemia, and vitamn b12 deficiency  Patient is a 64 year old female who presents for B12 deficiency  exam. The patient reports the following problems: documented B12 deficiency. Health maintenance has been reviewed up to date.  Hypertension This is a chronic problem. The current episode started more than 1 year ago. The problem has been gradually improving since onset. The problem is controlled. Pertinent negatives include no anxiety, blurred vision, chest pain, headaches, malaise/fatigue, neck pain, orthopnea, palpitations, peripheral edema, PND, shortness of breath or sweats. There are no associated agents to hypertension. Risk factors for coronary artery disease include obesity. Past treatments include diuretics. The current treatment provides moderate improvement. There are no compliance problems.  There is no history of angina, kidney disease, CAD/MI, CVA, heart failure, left ventricular hypertrophy, PVD or retinopathy. There is no history of chronic renal disease, a hypertension causing med or renovascular disease.  Hyperlipidemia This is a chronic problem. The current episode started more than 1 year ago. The problem is controlled. Recent lipid tests were reviewed and are normal. She has no history of chronic renal disease, diabetes, hypothyroidism, liver disease, obesity or nephrotic syndrome. Factors aggravating her hyperlipidemia include thiazides. Pertinent negatives include no chest pain, focal sensory loss, focal weakness, leg pain, myalgias or shortness of breath. Current antihyperlipidemic treatment includes statins. The current treatment provides moderate improvement of lipids. There are no compliance problems.  Risk factors for coronary artery disease include hypertension.    Lab Results  Component Value Date   CREATININE 0.80 01/05/2019   BUN 12 07/22/2018   NA 140 07/22/2018   K 4.2 07/22/2018   CL 99 07/22/2018   CO2 26 07/22/2018   Lab Results  Component Value Date   CHOL 193 07/22/2018   HDL 58 07/22/2018   LDLCALC 117 (H) 07/22/2018   TRIG 91 07/22/2018   CHOLHDL 3.5 07/01/2017   No results found for: TSH No results found for: HGBA1C   Review of Systems  Constitutional: Negative for malaise/fatigue.  Eyes: Negative for blurred vision.  Respiratory: Negative for shortness of breath.   Cardiovascular: Negative for chest pain, palpitations, orthopnea and PND.  Musculoskeletal: Negative for myalgias and neck pain.  Neurological: Negative for focal weakness and headaches.    Patient Active Problem List   Diagnosis Date Noted  . Family history of malignant neoplasm of gastrointestinal tract   . Essential hypertension 05/02/2016  . Mixed hyperlipidemia 05/02/2016  . HSV-1 infection 05/02/2016    Allergies  Allergen Reactions  . Codeine Nausea Only  . Penicillins Rash  . Sulfa Antibiotics Rash    Past Surgical History:  Procedure Laterality Date  . APPENDECTOMY    . COLONOSCOPY  08/2013   cleared for 5 yrs- Dr Allen Norris  . COLONOSCOPY WITH PROPOFOL N/A 09/04/2018   Procedure: COLONOSCOPY WITH PROPOFOL;  Surgeon: Lucilla Lame, MD;  Location: [place];  Service: Endoscopy;  Laterality: N/A;  . REPLACEMENT TOTAL KNEE Left 09/17/2017  . VAGINAL HYSTERECTOMY      Social History   Tobacco Use  . Smoking status: Never Smoker  . Smokeless tobacco: Never Used  Substance Use Topics  . Alcohol use: Yes    Alcohol/week: 18.0 standard drinks    Types: 18 Cans of beer per week  . Drug use: No     Medication list has been reviewed and  updated.  Current Meds  Medication Sig  . aspirin 81 MG tablet Take 81 mg by mouth daily.  . hydrochlorothiazide (HYDRODIURIL) 25 MG tablet TAKE 1 TABLET BY MOUTH EVERY DAY  . L-Lysine 1000 MG TABS Take 2 tablets (2,000 mg total) by mouth daily. otc  . simvastatin (ZOCOR) 40 MG  tablet Take 1 tablet (40 mg total) by mouth daily.  . valACYclovir (VALTREX) 1000 MG tablet Take 1 tablet (1,000 mg total) by mouth 2 (two) times daily. As needed for breakout  . vitamin B-12 (CYANOCOBALAMIN) 1000 MCG tablet Take 2,000 mcg by mouth daily.    PHQ 2/9 Scores 08/10/2018 07/22/2018 07/01/2017 10/25/2016  PHQ - 2 Score 0 0 0 0  PHQ- 9 Score 0 0 0 0    BP Readings from Last 3 Encounters:  02/08/19 138/78  11/20/18 138/80  09/04/18 127/73    Physical Exam  Wt Readings from Last 3 Encounters:  02/08/19 222 lb (100.7 kg)  11/20/18 229 lb (103.9 kg)  09/04/18 223 lb (101.2 kg)    BP 138/78   Pulse 80   Ht 5\' 8"  (1.727 m)   Wt 222 lb (100.7 kg)   BMI 33.75 kg/m   Assessment and Plan:  1. Mixed hyperlipidemia Chronic.  Controlled.  Stable.  Continue simvastatin 40 mg once a day.  Will check lipid panel. - Lipid Panel With LDL/HDL Ratio - simvastatin (ZOCOR) 40 MG tablet; Take 1 tablet (40 mg total) by mouth daily.  Dispense: 90 tablet; Refill: 1  2. Essential hypertension Chronic.  Controlled.  Stable.  Will check comprehensive metabolic panel to evaluate patient for GFR as well as continuance of hydrochlorothiazide 25 mg once a day. - Comprehensive Metabolic Panel (CMET) - hydrochlorothiazide (HYDRODIURIL) 25 MG tablet; Take 1 tablet (25 mg total) by mouth daily.  Dispense: 90 tablet; Refill: 1  3. B12 deficiency: Patient was low and there was no Patient states that she was found to be vitamin B12 deficient but actually she was in the low range of 210 on evaluation although that this was not technically a deficiency of B12 but we will check a CBC to see if there is any manifestation of anemia.  In the meantime patient has been taking B12 orally and we will recheck her B12 to see if this has responded to rule out the possibility of an early intrinsic factor deficiency. - B12 - CBC with Differential/Platelet  4.  Altered olfactory perception: Patient continue to have an  abnormality in her smell in only the right lateral aspect of her facial this is been thoroughly evaluated by ear nose and throat and patient is currently being referred to neurology for further evaluation is using some periodontal apparatus which has an odor to it and that she equates with the smell.  This is soon to be discontinued on a regular basis and the patient will be only using it on an episodic basis it would be interesting to see if this resolves or if the neurologic evaluation reveals any concerns.

## 2019-02-09 LAB — CBC WITH DIFFERENTIAL/PLATELET
Basophils Absolute: 0 10*3/uL (ref 0.0–0.2)
Basos: 1 %
EOS (ABSOLUTE): 0.1 10*3/uL (ref 0.0–0.4)
Eos: 2 %
Hematocrit: 44.3 % (ref 34.0–46.6)
Hemoglobin: 14.6 g/dL (ref 11.1–15.9)
Immature Grans (Abs): 0 10*3/uL (ref 0.0–0.1)
Immature Granulocytes: 0 %
Lymphocytes Absolute: 1.9 10*3/uL (ref 0.7–3.1)
Lymphs: 33 %
MCH: 32.4 pg (ref 26.6–33.0)
MCHC: 33 g/dL (ref 31.5–35.7)
MCV: 98 fL — ABNORMAL HIGH (ref 79–97)
Monocytes Absolute: 0.3 10*3/uL (ref 0.1–0.9)
Monocytes: 6 %
Neutrophils Absolute: 3.5 10*3/uL (ref 1.4–7.0)
Neutrophils: 58 %
Platelets: 249 10*3/uL (ref 150–450)
RBC: 4.51 x10E6/uL (ref 3.77–5.28)
RDW: 12.7 % (ref 11.7–15.4)
WBC: 5.9 10*3/uL (ref 3.4–10.8)

## 2019-02-09 LAB — COMPREHENSIVE METABOLIC PANEL
ALT: 16 IU/L (ref 0–32)
AST: 22 IU/L (ref 0–40)
Albumin/Globulin Ratio: 1.9 (ref 1.2–2.2)
Albumin: 4.6 g/dL (ref 3.8–4.8)
Alkaline Phosphatase: 64 IU/L (ref 39–117)
BUN/Creatinine Ratio: 18 (ref 12–28)
BUN: 12 mg/dL (ref 8–27)
Bilirubin Total: 0.6 mg/dL (ref 0.0–1.2)
CO2: 26 mmol/L (ref 20–29)
Calcium: 10.2 mg/dL (ref 8.7–10.3)
Chloride: 100 mmol/L (ref 96–106)
Creatinine, Ser: 0.68 mg/dL (ref 0.57–1.00)
GFR calc Af Amer: 106 mL/min/{1.73_m2} (ref >59)
GFR calc non Af Amer: 92 mL/min/{1.73_m2} (ref >59)
Globulin, Total: 2.4 g/dL (ref 1.5–4.5)
Glucose: 98 mg/dL (ref 65–99)
Potassium: 4.4 mmol/L (ref 3.5–5.2)
Sodium: 139 mmol/L (ref 134–144)
Total Protein: 7 g/dL (ref 6.0–8.5)

## 2019-02-09 LAB — LIPID PANEL WITH LDL/HDL RATIO
Cholesterol, Total: 210 mg/dL — ABNORMAL HIGH (ref 100–199)
HDL: 71 mg/dL (ref >39)
LDL Chol Calc (NIH): 121 mg/dL — ABNORMAL HIGH (ref 0–99)
LDL/HDL Ratio: 1.7 ratio (ref 0.0–3.2)
Triglycerides: 102 mg/dL (ref 0–149)
VLDL Cholesterol Cal: 18 mg/dL (ref 5–40)

## 2019-02-09 LAB — VITAMIN B12: Vitamin B-12: 682 pg/mL (ref 232–1245)

## 2019-02-11 ENCOUNTER — Ambulatory Visit: Payer: Self-pay | Admitting: Family Medicine

## 2019-07-27 ENCOUNTER — Other Ambulatory Visit: Payer: Self-pay | Admitting: Family Medicine

## 2019-07-27 DIAGNOSIS — Z1231 Encounter for screening mammogram for malignant neoplasm of breast: Secondary | ICD-10-CM

## 2019-07-30 ENCOUNTER — Encounter: Payer: Self-pay | Admitting: Family Medicine

## 2019-08-02 ENCOUNTER — Other Ambulatory Visit: Payer: Self-pay

## 2019-08-02 ENCOUNTER — Ambulatory Visit
Admission: RE | Admit: 2019-08-02 | Discharge: 2019-08-02 | Disposition: A | Discharge status: Home / Self Care | Payer: Medicare Other | Source: Ambulatory Visit | Attending: Family Medicine | Admitting: Family Medicine

## 2019-08-02 DIAGNOSIS — Z1231 Encounter for screening mammogram for malignant neoplasm of breast: Secondary | ICD-10-CM | POA: Diagnosis not present

## 2019-08-04 ENCOUNTER — Encounter: Payer: Self-pay | Admitting: Family Medicine

## 2019-08-09 ENCOUNTER — Encounter: Payer: Self-pay | Admitting: Family Medicine

## 2019-08-09 ENCOUNTER — Other Ambulatory Visit: Payer: Self-pay | Admitting: Family Medicine

## 2019-08-09 ENCOUNTER — Other Ambulatory Visit: Payer: Self-pay

## 2019-08-09 ENCOUNTER — Ambulatory Visit (INDEPENDENT_AMBULATORY_CARE_PROVIDER_SITE_OTHER): Payer: Medicare Other | Admitting: Family Medicine

## 2019-08-09 VITALS — BP 120/64 | HR 64 | Ht 68.0 in | Wt 223.0 lb

## 2019-08-09 DIAGNOSIS — E782 Mixed hyperlipidemia: Secondary | ICD-10-CM | POA: Diagnosis not present

## 2019-08-09 DIAGNOSIS — I10 Essential (primary) hypertension: Secondary | ICD-10-CM

## 2019-08-09 DIAGNOSIS — B009 Herpesviral infection, unspecified: Secondary | ICD-10-CM

## 2019-08-09 MED ORDER — HYDROCHLOROTHIAZIDE 25 MG PO TABS: 25.0000 mg | ORAL_TABLET | Freq: Every day | ORAL | 1 refills | Status: DC

## 2019-08-09 MED ORDER — VALACYCLOVIR HCL 1 G PO TABS: 1000.0000 mg | ORAL_TABLET | Freq: Two times a day (BID) | ORAL | 1 refills | Status: DC

## 2019-08-09 MED ORDER — SIMVASTATIN 40 MG PO TABS: 40.0000 mg | ORAL_TABLET | Freq: Every day | ORAL | 1 refills | Status: DC

## 2019-08-09 NOTE — Progress Notes (Signed)
Date:  08/09/2019   Name:  Katrina Crawford   DOB:  02-Aug-1955   MRN:  [id number]   Chief Complaint: Hyperlipidemia, Hypertension, and viral syndrome  Hyperlipidemia This is a chronic problem. The current episode started more than 1 year ago. The problem is controlled. Recent lipid tests were reviewed and are normal. She has no history of chronic renal disease, diabetes, hypothyroidism, liver disease, obesity or nephrotic syndrome. There are no known factors aggravating her hyperlipidemia. Pertinent negatives include no chest pain, focal sensory loss, focal weakness, leg pain, myalgias or shortness of breath. Current antihyperlipidemic treatment includes statins. The current treatment provides moderate improvement of lipids. There are no compliance problems.   Hypertension This is a chronic problem. The current episode started more than 1 year ago. The problem has been gradually improving since onset. The problem is controlled. Pertinent negatives include no anxiety, blurred vision, chest pain, headaches, malaise/fatigue, neck pain, orthopnea, palpitations, peripheral edema, PND, shortness of breath or sweats. There are no associated agents to hypertension. There are no known risk factors for coronary artery disease. Past treatments include diuretics. The current treatment provides moderate improvement. There are no compliance problems.  There is no history of angina, kidney disease, CAD/MI, CVA, heart failure, left ventricular hypertrophy, PVD or retinopathy. There is no history of chronic renal disease.    Lab Results  Component Value Date   CREATININE 0.68 02/08/2019   BUN 12 02/08/2019   NA 139 02/08/2019   K 4.4 02/08/2019   CL 100 02/08/2019   CO2 26 02/08/2019   Lab Results  Component Value Date   CHOL 210 (H) 02/08/2019   HDL 71 02/08/2019   LDLCALC 121 (H) 02/08/2019   TRIG 102 02/08/2019   CHOLHDL 3.5 07/01/2017   No results found for: TSH No results found for: HGBA1C Lab  Results  Component Value Date   WBC 5.9 02/08/2019   HGB 14.6 02/08/2019   HCT 44.3 02/08/2019   MCV 98 (H) 02/08/2019   PLT 249 02/08/2019   Lab Results  Component Value Date   ALT 16 02/08/2019   AST 22 02/08/2019   ALKPHOS 64 02/08/2019   BILITOT 0.6 02/08/2019     Review of Systems  Constitutional: Negative.  Negative for chills, fatigue, fever, malaise/fatigue and unexpected weight change.  HENT: Negative for congestion, ear discharge, ear pain, rhinorrhea, sinus pressure, sneezing and sore throat.   Eyes: Negative for blurred vision, photophobia, pain, discharge, redness and itching.  Respiratory: Negative for cough, shortness of breath, wheezing and stridor.   Cardiovascular: Negative for chest pain, palpitations, orthopnea and PND.  Gastrointestinal: Negative for abdominal pain, blood in stool, constipation, diarrhea, nausea and vomiting.  Endocrine: Negative for cold intolerance, heat intolerance, polydipsia, polyphagia and polyuria.  Genitourinary: Negative for dysuria, flank pain, frequency, hematuria, menstrual problem, pelvic pain, urgency, vaginal bleeding and vaginal discharge.  Musculoskeletal: Negative for arthralgias, back pain, myalgias and neck pain.  Skin: Negative for rash.  Allergic/Immunologic: Negative for environmental allergies and food allergies.  Neurological: Negative for dizziness, focal weakness, weakness, light-headedness, numbness and headaches.  Hematological: Negative for adenopathy. Does not bruise/bleed easily.  Psychiatric/Behavioral: Negative for dysphoric mood. The patient is not nervous/anxious.     Patient Active Problem List   Diagnosis Date Noted   Family history of malignant neoplasm of gastrointestinal tract    Essential hypertension 05/02/2016   Mixed hyperlipidemia 05/02/2016   HSV-1 infection 05/02/2016    Allergies  Allergen Reactions   Codeine  Nausea Only   Penicillins Rash   Sulfa Antibiotics Rash    Past  Surgical History:  Procedure Laterality Date   APPENDECTOMY     COLONOSCOPY  08/2013   cleared for 5 yrs- Dr Servando Snare   COLONOSCOPY WITH PROPOFOL N/A 09/04/2018   Procedure: COLONOSCOPY WITH PROPOFOL;  Surgeon: Midge Minium, MD;  Location: [facility];  Service: Endoscopy;  Laterality: N/A;   REPLACEMENT TOTAL KNEE Left 09/17/2017   VAGINAL HYSTERECTOMY      Social History   Tobacco Use   Smoking status: Never Smoker   Smokeless tobacco: Never Used  Vaping Use   Vaping Use: Never used  Substance Use Topics   Alcohol use: Yes    Alcohol/week: 18.0 standard drinks    Types: 18 Cans of beer per week   Drug use: No     Medication list has been reviewed and updated.  Current Meds  Medication Sig   aspirin 81 MG tablet Take 81 mg by mouth daily.   Cholecalciferol (DIALYVITE VITAMIN D 5000 PO) Take 1 capsule by mouth 3 (three) times a week.   hydrochlorothiazide (HYDRODIURIL) 25 MG tablet TAKE 1 TABLET DAILY   L-Lysine 1000 MG TABS Take 2 tablets (2,000 mg total) by mouth daily. otc   simvastatin (ZOCOR) 40 MG tablet Take 1 tablet (40 mg total) by mouth daily.   valACYclovir (VALTREX) 1000 MG tablet Take 1 tablet (1,000 mg total) by mouth 2 (two) times daily. As needed for breakout   vitamin B-12 (CYANOCOBALAMIN) 1000 MCG tablet Take 2,000 mcg by mouth daily.    PHQ 2/9 Scores 08/09/2019 08/10/2018 07/22/2018 07/01/2017  PHQ - 2 Score 0 0 0 0  PHQ- 9 Score 0 0 0 0    GAD 7 : Generalized Anxiety Score 08/09/2019  Nervous, Anxious, on Edge 1  Control/stop worrying 0  Worry too much - different things 0  Trouble relaxing 0  Restless 0  Easily annoyed or irritable 0  Afraid - awful might happen 0  Total GAD 7 Score 1  Anxiety Difficulty Not difficult at all    BP Readings from Last 3 Encounters:  08/09/19 120/64  02/08/19 138/78  11/20/18 138/80    Physical Exam Vitals and nursing note reviewed.  Constitutional:      Appearance: She is  well-developed.  HENT:     Head: Normocephalic.     Right Ear: Tympanic membrane, ear canal and external ear normal. There is no impacted cerumen.     Left Ear: Tympanic membrane, ear canal and external ear normal. There is no impacted cerumen.     Nose: Nose normal. No congestion or rhinorrhea.     Mouth/Throat:     Mouth: Mucous membranes are moist.  Eyes:     General: Lids are everted, no foreign bodies appreciated. No scleral icterus.       Left eye: No foreign body or hordeolum.     Conjunctiva/sclera: Conjunctivae normal.     Right eye: Right conjunctiva is not injected.     Left eye: Left conjunctiva is not injected.     Pupils: Pupils are equal, round, and reactive to light.  Neck:     Thyroid: No thyromegaly.     Vascular: No carotid bruit or JVD.     Trachea: No tracheal deviation.  Cardiovascular:     Rate and Rhythm: Normal rate and regular rhythm.     Heart sounds: Normal heart sounds. No murmur heard.  No friction rub. No gallop.  Pulmonary:     Effort: Pulmonary effort is normal. No respiratory distress.     Breath sounds: Normal breath sounds. No wheezing, rhonchi or rales.  Abdominal:     General: Bowel sounds are normal.     Palpations: Abdomen is soft. There is no mass.     Tenderness: There is no abdominal tenderness. There is no guarding or rebound.  Musculoskeletal:        General: No tenderness. Normal range of motion.     Cervical back: Normal range of motion and neck supple. No rigidity or tenderness.  Lymphadenopathy:     Cervical: No cervical adenopathy.  Skin:    General: Skin is warm.     Capillary Refill: Capillary refill takes less than 2 seconds.     Findings: No rash.  Neurological:     Mental Status: She is alert and oriented to person, place, and time.     Cranial Nerves: No cranial nerve deficit.     Deep Tendon Reflexes: Reflexes normal.  Psychiatric:        Mood and Affect: Mood is not anxious or depressed.     Wt Readings from  Last 3 Encounters:  08/09/19 223 lb (101.2 kg)  02/08/19 222 lb (100.7 kg)  11/20/18 229 lb (103.9 kg)    BP 120/64    Pulse 64    Ht 5\' 8"  (1.727 m)    Wt 223 lb (101.2 kg)    BMI 33.91 kg/m   Assessment and Plan:  1. Essential hypertension Chronic.  Controlled.  Stable.  Continue hydrochlorothiazide 25 mg once a day.  Will check renal function panel for assessment of electrolytes and GFR. - hydrochlorothiazide (HYDRODIURIL) 25 MG tablet; Take 1 tablet (25 mg total) by mouth daily.  Dispense: 90 tablet; Refill: 1 - Renal Function Panel  2. Mixed hyperlipidemia Chronic.  Controlled.  Stable.  Continue simvastatin 40 mg once a day.  Will check lipid panel with ratio to evaluate current level of lipid control on statin. - simvastatin (ZOCOR) 40 MG tablet; Take 1 tablet (40 mg total) by mouth daily.  Dispense: 90 tablet; Refill: 1 - Lipid Panel With LDL/HDL Ratio  3. HSV-1 infection Chronic episodic controlled.  Continue Valtrex 1 g twice a day as needed for breakout. - valACYclovir (VALTREX) 1000 MG tablet; Take 1 tablet (1,000 mg total) by mouth 2 (two) times daily. As needed for breakout  Dispense: 20 tablet; Refill: 1

## 2019-08-10 LAB — LIPID PANEL WITH LDL/HDL RATIO
Cholesterol, Total: 206 mg/dL — ABNORMAL HIGH (ref 100–199)
HDL: 63 mg/dL (ref >39)
LDL Chol Calc (NIH): 125 mg/dL — ABNORMAL HIGH (ref 0–99)
LDL/HDL Ratio: 2 ratio (ref 0.0–3.2)
Triglycerides: 99 mg/dL (ref 0–149)
VLDL Cholesterol Cal: 18 mg/dL (ref 5–40)

## 2019-08-10 LAB — RENAL FUNCTION PANEL
Albumin: 4.8 g/dL (ref 3.8–4.8)
BUN/Creatinine Ratio: 16 (ref 12–28)
BUN: 12 mg/dL (ref 8–27)
CO2: 26 mmol/L (ref 20–29)
Calcium: 10.1 mg/dL (ref 8.7–10.3)
Chloride: 100 mmol/L (ref 96–106)
Creatinine, Ser: 0.74 mg/dL (ref 0.57–1.00)
GFR calc Af Amer: 98 mL/min/{1.73_m2} (ref >59)
GFR calc non Af Amer: 85 mL/min/{1.73_m2} (ref >59)
Glucose: 88 mg/dL (ref 65–99)
Phosphorus: 3.9 mg/dL (ref 3.0–4.3)
Potassium: 4.8 mmol/L (ref 3.5–5.2)
Sodium: 141 mmol/L (ref 134–144)

## 2019-09-01 ENCOUNTER — Encounter: Payer: Self-pay | Admitting: Family Medicine

## 2019-10-15 ENCOUNTER — Other Ambulatory Visit: Payer: Self-pay

## 2019-10-15 ENCOUNTER — Telehealth: Payer: Self-pay | Admitting: Family Medicine

## 2019-10-15 MED ORDER — CEPHALEXIN 500 MG PO CAPS
500.0000 mg | ORAL_CAPSULE | Freq: Three times a day (TID) | ORAL | 0 refills | Status: DC
Start: 2019-10-15 — End: 2019-10-20

## 2019-10-15 NOTE — Telephone Encounter (Signed)
Left patient a VM informing her we sent in cephalexin 500 mg TID for the next 3 days to [facility] in [place]. Told her we don't normally send in abx without seeing the patient but Dr Yetta Barre agreed this one time since she is off today. Told her if her sxs do not get better after completing the abx then she needs to come see Dr Yetta Barre next week for an appt.  CM

## 2019-10-15 NOTE — Telephone Encounter (Signed)
Copied from CRM [id number]. Topic: General - Other >> Oct 15, 2019  8:03 AM Katrina Crawford E wrote: Reason for CRM: Pt called to see if she can come in today to drop off urine/Pt states she has a UTI and it is very painful/she states she ca not go through the weekend with this pain/ Pt asked if an antibiotic can be called in/ Please advise

## 2019-10-16 ENCOUNTER — Ambulatory Visit (INDEPENDENT_AMBULATORY_CARE_PROVIDER_SITE_OTHER): Payer: Medicare Other

## 2019-10-16 ENCOUNTER — Ambulatory Visit
Admission: EM | Admit: 2019-10-16 | Discharge: 2019-10-16 | Disposition: A | Discharge status: Home / Self Care | Payer: Medicare Other | Attending: Emergency Medicine | Admitting: Emergency Medicine

## 2019-10-16 ENCOUNTER — Other Ambulatory Visit: Payer: Self-pay

## 2019-10-16 DIAGNOSIS — S9032XA Contusion of left foot, initial encounter: Secondary | ICD-10-CM | POA: Diagnosis not present

## 2019-10-16 DIAGNOSIS — M25572 Pain in left ankle and joints of left foot: Secondary | ICD-10-CM

## 2019-10-16 DIAGNOSIS — S92352A Displaced fracture of fifth metatarsal bone, left foot, initial encounter for closed fracture: Secondary | ICD-10-CM

## 2019-10-16 NOTE — ED Provider Notes (Signed)
[facility]    CSN: [id number] Arrival date & time: 10/16/19  1407      History   Chief Complaint Chief Complaint  Patient presents with  . Foot Pain    left    HPI Katrina Crawford is a 64 y.o. female.   64 yo female here for evaluation of left foot pain after falling down 1 step. She reports hitting her head on concrete but no LOC. She is able to walk but with pain. This happened at 12 noon today.      Past Medical History:  Diagnosis Date  . Arthritis    knees, lower back  . Hyperlipidemia   . Hypertension   . Orthodontics    invisalign    Patient Active Problem List   Diagnosis Date Noted  . Family history of malignant neoplasm of gastrointestinal tract   . Essential hypertension 05/02/2016  . Mixed hyperlipidemia 05/02/2016  . HSV-1 infection 05/02/2016    Past Surgical History:  Procedure Laterality Date  . APPENDECTOMY    . COLONOSCOPY  08/2013   cleared for 5 yrs- Dr Servando Snare  . COLONOSCOPY WITH PROPOFOL N/A 09/04/2018   Procedure: COLONOSCOPY WITH PROPOFOL;  Surgeon: Midge Minium, MD;  Location: [facility];  Service: Endoscopy;  Laterality: N/A;  . REPLACEMENT TOTAL KNEE Left 09/17/2017  . VAGINAL HYSTERECTOMY      OB History   No obstetric history on file.      Home Medications    Prior to Admission medications   Medication Sig Start Date End Date Taking? Authorizing Provider  aspirin 81 MG tablet Take 81 mg by mouth daily.   Yes [provider]  cephALEXin (KEFLEX) 500 MG capsule Take 1 capsule (500 mg total) by mouth 3 (three) times daily. 10/15/19  Yes Reubin Milan, MD  Cholecalciferol (DIALYVITE VITAMIN D 5000 PO) Take 1 capsule by mouth 3 (three) times a week.   Yes [provider]  hydrochlorothiazide (HYDRODIURIL) 25 MG tablet Take 1 tablet (25 mg total) by mouth daily. 08/09/19  Yes Duanne Limerick, MD  L-Lysine 1000 MG TABS Take 2 tablets (2,000 mg total) by mouth daily. otc 07/01/17  Yes Duanne Limerick, MD  simvastatin (ZOCOR) 40 MG tablet Take 1 tablet (40 mg total) by mouth daily. 08/09/19  Yes Duanne Limerick, MD  valACYclovir (VALTREX) 1000 MG tablet Take 1 tablet (1,000 mg total) by mouth 2 (two) times daily. As needed for breakout 08/09/19  Yes Duanne Limerick, MD  vitamin B-12 (CYANOCOBALAMIN) 1000 MCG tablet Take 2,000 mcg by mouth daily.   Yes [provider]    Family History Family History  Problem Relation Age of Onset  . Cancer Mother   . Hypertension Father   . Breast cancer Sister 79    Social History Social History   Tobacco Use  . Smoking status: Never Smoker  . Smokeless tobacco: Never Used  Vaping Use  . Vaping Use: Never used  Substance Use Topics  . Alcohol use: Yes    Alcohol/week: 18.0 standard drinks    Types: 18 Cans of beer per week  . Drug use: No     Allergies   Codeine, Penicillins, and Sulfa antibiotics   Review of Systems Review of Systems  Constitutional: Negative for activity change and chills.  HENT: Negative for congestion and rhinorrhea.   Respiratory: Negative for cough and shortness of breath.   Cardiovascular: Negative for chest pain.  Gastrointestinal: Negative for  diarrhea.  Genitourinary: Negative for difficulty urinating, dysuria and urgency.  Musculoskeletal: Positive for arthralgias and myalgias. Negative for back pain.  Skin: Positive for color change.       Bruising to left foot.   Neurological: Negative for dizziness, syncope, weakness, numbness and headaches.  Hematological: Negative.   Psychiatric/Behavioral: Negative.      Physical Exam Triage Vital Signs ED Triage Vitals  Enc Vitals Group     BP 10/16/19 1501 (!) 161/100     Pulse Rate 10/16/19 1501 (!) 101     Resp 10/16/19 1501 18     Temp 10/16/19 1501 98.9 F (37.2 C)     Temp Source 10/16/19 1501 Oral     SpO2 10/16/19 1501 99 %     Weight 10/16/19 1459 220 lb (99.8 kg)     Height 10/16/19 1459 5\' 8"  (1.727 m)     Head  Circumference --      Peak Flow --      Pain Score 10/16/19 1459 3     Pain Loc --      Pain Edu? --      Excl. in GC? --    No data found.  Updated Vital Signs BP (!) 161/100 (BP Location: Left Arm)   Pulse (!) 101   Temp 98.9 F (37.2 C) (Oral)   Resp 18   Ht 5\' 8"  (1.727 m)   Wt 220 lb (99.8 kg)   SpO2 99%   BMI 33.45 kg/m   Visual Acuity Right Eye Distance:   Left Eye Distance:   Bilateral Distance:    Right Eye Near:   Left Eye Near:    Bilateral Near:     Physical Exam Vitals and nursing note reviewed.  Constitutional:      Appearance: Normal appearance.  HENT:     Head: Normocephalic and atraumatic.  Eyes:     Extraocular Movements: Extraocular movements intact.     Conjunctiva/sclera: Conjunctivae normal.     Pupils: Pupils are equal, round, and reactive to light.  Musculoskeletal:     Cervical back: Normal range of motion and neck supple.     Left foot: Normal range of motion. No deformity.  Feet:     Comments: There is tenderness over the head of the 5th metatarsal as well as overlaying ecchymosis and swelling to the lateral ankle and lateral mid-foot. No tenderness to either malleoli. Patient has full active ROM. No changes in sensation.  Neurological:     Mental Status: She is alert.      UC Treatments / Results  Labs (all labs ordered are listed, but only abnormal results are displayed) Labs Reviewed - No data to display  EKG   Radiology DG Foot Complete Left  Result Date: 10/16/2019 CLINICAL DATA:  Left foot pain and bruising after fall. EXAM: LEFT FOOT - COMPLETE 3+ VIEW COMPARISON:  None. FINDINGS: Oblique mildly displaced fifth metatarsal shaft fracture. There is no intra-articular extension. Small avulsion fractures from the dorsal talus and dorsal navicular. Possible tiny chip fracture from the anterior process of the calcaneus. Minimal osteoarthritis of the first metatarsal phalangeal joint. There is a small plantar calcaneal spur.  Generalized soft tissue edema at the fracture sites. IMPRESSION: 1. Oblique mildly displaced fifth metatarsal shaft fracture. 2. Small avulsion fractures from the dorsal talus and dorsal navicular. Possible tiny chip fracture from the anterior process of the calcaneus. Electronically Signed   By: M.D.   On: 10/16/2019 15:45  Procedures Procedures (including critical care time)  Medications Ordered in UC Medications - No data to display  Initial Impression / Assessment and Plan / UC Course  I have reviewed the triage vital signs and the nursing notes.  Pertinent labs & imaging results that were available during my care of the patient were reviewed by me and considered in my medical decision making (see chart for details).   Patient is here for evaluation of pain and bruising to her left foot. She was taking trash out to the garage and was turning off of the last step when she fell to her left and hit her head on the concrete. NO LOC, no neck pain, HA, or nausea.   Patient can bear weight but has tenderness and ecchymosis to the head of her 5th metatarsal. There is also swelling and ecchymosis to the lateral malleolus but no tenderness.  Will image foot with concern for fracture based on presentation and [place] foot rules.   X-ray read shows displaced mid-shaft 5th metatarsal fracture and chip avulsions to navicular and Talus.  Will apply posterior splint, non-weight bearing with crutches. Follow-up with Dr. Orland Jarred.   Final Clinical Impressions(s) / UC Diagnoses   Final diagnoses:  Closed displaced fracture of fifth metatarsal bone of left foot, initial encounter     Discharge Instructions     You have a broken bone in your left foot and several chips pulled out of your foot and ankle as well. Wear the splint until you follow-up with Dr. Orland Jarred- call Monday for an appointment.  No weight bearing- use the crutches. Keep your foot elevated as much as  possible.  Take Tylenol and Ibuprofen as needed for pain.  You can apply ice for 20 minutes at a time over top of the splint.     ED Prescriptions    None     PDMP not reviewed this encounter.   Becky Augusta, NP 10/16/19 1624

## 2019-10-16 NOTE — Discharge Instructions (Addendum)
You have a broken bone in your left foot and several chips pulled out of your foot and ankle as well. Wear the splint until you follow-up with Dr. Orland Jarred- call Monday for an appointment.  No weight bearing- use the crutches. Keep your foot elevated as much as possible.  Take Tylenol and Ibuprofen as needed for pain.  You can apply ice for 20 minutes at a time over top of the splint.

## 2019-10-16 NOTE — ED Triage Notes (Signed)
Patient states that she stepped off a step wrong and her foot and gave way. States that she heard and felt a pop on the side of her foot. Patient with bruising and swelling on the outside.

## 2019-10-18 ENCOUNTER — Ambulatory Visit: Payer: Medicare Other

## 2019-10-20 ENCOUNTER — Ambulatory Visit (INDEPENDENT_AMBULATORY_CARE_PROVIDER_SITE_OTHER): Payer: Medicare Other

## 2019-10-20 DIAGNOSIS — Z0000 Encounter for general adult medical examination without abnormal findings: Secondary | ICD-10-CM

## 2019-10-20 NOTE — Progress Notes (Signed)
Subjective:   Katrina RegesYvonne R Juncaj is a 64 y.o. female who presents for an Initial Medicare Annual Wellness Visit.  Virtual Visit via Telephone Note  I connected with  Katrina Crawford on 10/20/19 at  2:40 PM EDT by telephone and verified that I am speaking with the correct person using two identifiers.  Medicare Annual Wellness visit completed telephonically due to Covid-19 pandemic.   Location: Patient: [facility]   I discussed the limitations, risks, security and privacy concerns of performing an evaluation and management service by telephone and the availability of in person appointments. The patient expressed understanding and agreed to proceed.  Unable to perform video visit due to video visit attempted and failed and/or patient does not have video capability.   Some vital signs may be absent or patient reported.   Reather LittlerKasey Terese Heier, LPN    Review of Systems     Cardiac Risk Factors include: advanced age (>6555men, 89>65 women);dyslipidemia     Objective:    Today's Vitals   10/20/19 1500  PainSc: 2    There is no height or weight on file to calculate BMI.  Advanced Directives 10/20/2019 10/16/2019 09/04/2018  Does Patient Have a Medical Advance Directive? Yes No Yes  Type of Estate agentAdvance Directive Healthcare Power of [place]orney;Living will - Healthcare Power of [place];Living will  Does patient want to make changes to medical advance directive? - - No - Patient declined  Copy of Healthcare Power of Attorney in Chart? No - copy requested - Yes - validated most recent copy scanned in chart (See row information)    Current Medications (verified) Outpatient Encounter Medications as of 10/20/2019  Medication Sig  . aspirin 81 MG tablet Take 81 mg by mouth daily.  . Cholecalciferol (DIALYVITE VITAMIN D 5000 PO) Take 1 capsule by mouth 3 (three) times a week.  . hydrochlorothiazide (HYDRODIURIL) 25 MG tablet Take 1 tablet (25 mg total) by mouth daily.  Marland Kitchen. L-Lysine 1000 MG TABS Take 2  tablets (2,000 mg total) by mouth daily. otc  . simvastatin (ZOCOR) 40 MG tablet Take 1 tablet (40 mg total) by mouth daily.  . valACYclovir (VALTREX) 1000 MG tablet Take 1 tablet (1,000 mg total) by mouth 2 (two) times daily. As needed for breakout  . vitamin B-12 (CYANOCOBALAMIN) 1000 MCG tablet Take 2,000 mcg by mouth daily.  . [DISCONTINUED] cephALEXin (KEFLEX) 500 MG capsule Take 1 capsule (500 mg total) by mouth 3 (three) times daily.   No facility-administered encounter medications on file as of 10/20/2019.    Allergies (verified) Codeine, Penicillins, and Sulfa antibiotics   History: Past Medical History:  Diagnosis Date  . Allergy 01/22/1998  . Arthritis    knees, lower back  . Hyperlipidemia   . Hypertension   . Orthodontics    invisalign   Past Surgical History:  Procedure Laterality Date  . APPENDECTOMY    . COLONOSCOPY  08/2013   cleared for 5 yrs- Dr Servando SnareWohl  . COLONOSCOPY WITH PROPOFOL N/A 09/04/2018   Procedure: COLONOSCOPY WITH PROPOFOL;  Surgeon: Midge MiniumWohl, Darren, MD;  [facility];  Service: Endoscopy;  Laterality: N/A;  . JOINT REPLACEMENT  11/18/2017  . REPLACEMENT TOTAL KNEE Left 09/17/2017  . VAGINAL HYSTERECTOMY     Family History  Problem Relation Age of Onset  . Cancer Mother   . Hypertension Father   . Breast cancer Sister [id number]  . ADD / ADHD Sister   . Cancer Sister   . Cancer Sister   .  Cancer Brother   . Cancer Brother    Social History   Socioeconomic History  . Marital status: Married    Spouse name: Not on file  . Number of children: Not on file  . Years of education: Not on file  . Highest education level: Not on file  Occupational History  . Not on file  Tobacco Use  . Smoking status: Never Smoker  . Smokeless tobacco: Never Used  Vaping Use  . Vaping Use: Never used  Substance and Sexual Activity  . Alcohol use: Yes    Alcohol/week: 18.0 standard drinks    Types: 18 Cans of beer per week  . Drug use: No  . Sexual  activity: Yes    Birth control/protection: None  Other Topics Concern  . Not on file  Social History Narrative  . Not on file   Social Determinants of Health   Financial Resource Strain: Low Risk   . Difficulty of Paying Living Expenses: Not hard at all  Food Insecurity: No Food Insecurity  . Worried About Programme researcher, broadcasting/film/video in the Last Year: Never true  . Ran Out of Food in the Last Year: Never true  Transportation Needs: No Transportation Needs  . Lack of Transportation (Medical): No  . Lack of Transportation (Non-Medical): No  Physical Activity: Sufficiently Active  . Days of Exercise per Week: 7 days  . Minutes of Exercise per Session: 60 min  Stress: No Stress Concern Present  . Feeling of Stress : Only a little  Social Connections: Moderately Integrated  . Frequency of Communication with Friends and Family: More than three times a week  . Frequency of Social Gatherings with Friends and Family: More than three times a week  . Attends Religious Services: More than 4 times per year  . Active Member of Clubs or Organizations: No  . Attends Banker Meetings: Never  . Marital Status: Married    Tobacco Counseling Counseling given: Not Answered   Clinical Intake:  Pre-visit preparation completed: Yes  Pain : 0-10 Pain Score: 2  Pain Type: Acute pain Pain Location: Foot Pain Orientation: Left Pain Descriptors / Indicators: Sore, Throbbing Pain Onset: In the past 7 days Pain Frequency: Constant     Nutritional Status: BMI > 30  Obese Nutritional Risks: None Diabetes: No  How often do you need to have someone help you when you read instructions, pamphlets, or other written materials from your doctor or pharmacy?: 1 - Never    Interpreter Needed?: No  Information entered by :: Reather Littler LPN   Activities of Daily Living In your present state of health, do you have any difficulty performing the following activities: 10/20/2019  Hearing? N    Comment declines hearing aids  Vision? N  Difficulty concentrating or making decisions? N  Walking or climbing stairs? N  Dressing or bathing? N  Doing errands, shopping? N  Preparing Food and eating ? N  Using the Toilet? N  In the past six months, have you accidently leaked urine? N  Do you have problems with loss of bowel control? N  Managing your Medications? N  Managing your Finances? N  Housekeeping or managing your Housekeeping? N  Some recent data might be hidden    Patient Care Team: Duanne Limerick, MD as PCP - General (Family Medicine)  Indicate any recent Medical Services you may have received from other than [facility] providers in the past year (date may be approximate).  Assessment:   This is a routine wellness examination for Katrina Crawford.  Hearing/Vision screen  Hearing Screening   125Hz  250Hz  500Hz  1000Hz  2000Hz  3000Hz  4000Hz  6000Hz  8000Hz   Right ear:           Left ear:           Comments: Pt denies hearing difficulty  Vision Screening Comments: Annual vision screenings with Dr.  Dietary issues and exercise activities discussed: Current Exercise Habits: Home exercise routine, Type of exercise: Other - see comments (elliptical, stationary bike), Time (Minutes): 60, Frequency (Times/Week): 7, Weekly Exercise (Minutes/Week): 420, Intensity: Moderate, Exercise limited by: orthopedic condition(s)  Goals   None    Depression Screen PHQ 2/9 Scores 10/20/2019 08/09/2019 08/10/2018 07/22/2018 07/01/2017 10/25/2016 03/24/2015  PHQ - 2 Score 0 0 0 0 0 0 0  PHQ- 9 Score - 0 0 0 0 0 -    Fall Risk Fall Risk  10/20/2019 08/09/2019 07/01/2017 03/24/2015 12/23/2014  Falls in the past year? 1 0 No No No  Number falls in past yr: 1 - - - -  Injury with Fall? 1 - - - -  Risk for fall due to : Impaired mobility;History of fall(s) - - - -  Follow up Falls prevention discussed Falls evaluation completed - - -    Any stairs in or around the home? Yes  If so, are there any without  handrails? No  Home free of loose throw rugs in walkways, pet beds, electrical cords, etc? Yes  Adequate lighting in your home to reduce risk of falls? Yes   ASSISTIVE DEVICES UTILIZED TO PREVENT FALLS:  Life alert? No  Use of a cane, walker or w/c? No  Grab bars in the bathroom? No  Shower chair or bench in shower? Yes  Elevated toilet seat or a handicapped toilet? Yes   TIMED UP AND GO:  Was the test performed? No . Telephonic visit.   Cognitive Function: 6CIT deferred for 2021 AWV; no memory issues         Immunizations Immunization History  Administered Date(s) Administered  . Fluad Quad(high Dose 65+) 09/28/2018  . Influenza Inj Mdck Quad Pf 09/29/2017  . Influenza,inj,Quad PF,6+ Mos 10/06/2015, 09/24/2016  . Influenza-Unspecified 10/11/2014, 09/23/2016, 09/28/2018  . PFIZER SARS-COV-2 Vaccination 02/15/2019, 03/08/2019  . Pneumococcal Conjugate-13 11/20/2018  . Tdap 03/24/2015  . Zoster Recombinat (Shingrix) 10/25/2016    TDAP status: Up to date    Flu Vaccine status: Up to date   Pneumococcal vaccine status: Up to date   Covid-19 vaccine status: Completed vaccines  Qualifies for Shingles Vaccine? Yes   Zostavax completed Yes   Shingrix Completed?: Yes  Screening Tests Health Maintenance  Topic Date Due  . INFLUENZA VACCINE  08/22/2019  . DEXA SCAN  08/08/2020 (Originally 08/27/2018)  . PNA vac Low Risk Adult (2 of 2 - PPSV23) 11/20/2019  . MAMMOGRAM  08/01/2021  . COLONOSCOPY  09/04/2023  . TETANUS/TDAP  03/23/2025  . COVID-19 Vaccine  Completed  . Hepatitis C Screening  Completed    Health Maintenance  Health Maintenance Due  Topic Date Due  . INFLUENZA VACCINE  08/22/2019    Colorectal cancer screening: Completed 09/04/18. Repeat every 5 years   Mammogram status: Completed 08/02/19. Repeat every year   Bone density screening: postponed  Lung Cancer Screening: (Low Dose CT Chest recommended if Age 41-80 years, 30 pack-year currently smoking  OR have quit w/in 15years.) does not qualify.   Additional Screening:  Hepatitis C Screening: does  qualify; Completed 05/02/16  Vision Screening: Recommended annual ophthalmology exams for early detection of glaucoma and other disorders of the eye. Is the patient up to date with their annual eye exam?  Yes  Who is the provider or what is the name of the office in which the patient attends annual eye exams? Dr. Alvester Morin  Dental Screening: Recommended annual dental exams for proper oral hygiene  Community Resource Referral / Chronic Care Management: CRR required this visit?  No   CCM required this visit?  No      Plan:     I have personally reviewed and noted the following in the patient's chart:   . Medical and social history . Use of alcohol, tobacco or illicit drugs  . Current medications and supplements . Functional ability and status . Nutritional status . Physical activity . Advanced directives . List of other physicians . Hospitalizations, surgeries, and ER visits in previous 12 months . Vitals . Screenings to include cognitive, depression, and falls . Referrals and appointments  In addition, I have reviewed and discussed with patient certain preventive protocols, quality metrics, and best practice recommendations. A written personalized care plan for preventive services as well as general preventive health recommendations were provided to patient.     Reather Littler, LPN   7/00/1749   Nurse Notes: none

## 2019-10-20 NOTE — Patient Instructions (Signed)
[name] , Thank you for taking time to come for your Medicare Wellness Visit. I appreciate your ongoing commitment to your health goals. Please review the following plan we discussed and let me know if I can assist you in the future.   Screening recommendations/referrals: Colonoscopy: done 09/04/18.  Mammogram: done 08/02/19 Bone Density: due Recommended yearly ophthalmology/optometry visit for glaucoma screening and checkup Recommended yearly dental visit for hygiene and checkup  Vaccinations: Influenza vaccine: done 09/28/19 Pneumococcal vaccine: done 11/20/2018 due for Pneumovax23 11/22/19 Tdap vaccine: done 03/24/15 Shingles vaccine: done 10/25/16 - need second dose information; [facility] said they did not have it   Covid-19: done 02/15/19 & 03/08/19  Advanced directives: Please bring a copy of your health care power of attorney and living will to the office at your convenience.  Conditions/risks identified: Recommend increasing physical activity as tolerated  Next appointment: Follow up in one year for your annual wellness visit    Preventive Care 65 Years and Older, Female Preventive care refers to lifestyle choices and visits with your health care provider that can promote health and wellness. What does preventive care include?  A yearly physical exam. This is also called an annual well check.  Dental exams once or twice a year.  Routine eye exams. Ask your health care provider how often you should have your eyes checked.  Personal lifestyle choices, including:  Daily care of your teeth and gums.  Regular physical activity.  Eating a healthy diet.  Avoiding tobacco and drug use.  Limiting alcohol use.  Practicing safe sex.  Taking low-dose aspirin every day.  Taking vitamin and mineral supplements as recommended by your health care provider. What happens during an annual well check? The services and screenings done by your health care provider during your annual  well check will depend on your age, overall health, lifestyle risk factors, and family history of disease. Counseling  Your health care provider may ask you questions about your:  Alcohol use.  Tobacco use.  Drug use.  Emotional well-being.  Home and relationship well-being.  Sexual activity.  Eating habits.  History of falls.  Memory and ability to understand (cognition).  Work and work Astronomer.  Reproductive health. Screening  You may have the following tests or measurements:  Height, weight, and BMI.  Blood pressure.  Lipid and cholesterol levels. These may be checked every 5 years, or more frequently if you are over 3 years old.  Skin check.  Lung cancer screening. You may have this screening every year starting at age 45 if you have a 30-pack-year history of smoking and currently smoke or have quit within the past 15 years.  Fecal occult blood test (FOBT) of the stool. You may have this test every year starting at age 37.  Flexible sigmoidoscopy or colonoscopy. You may have a sigmoidoscopy every 5 years or a colonoscopy every 10 years starting at age 49.  Hepatitis C blood test.  Hepatitis B blood test.  Sexually transmitted disease (STD) testing.  Diabetes screening. This is done by checking your blood sugar (glucose) after you have not eaten for a while (fasting). You may have this done every 1-3 years.  Bone density scan. This is done to screen for osteoporosis. You may have this done starting at age 51.  Mammogram. This may be done every 1-2 years. Talk to your health care provider about how often you should have regular mammograms. Talk with your health care provider about your test results, treatment options, and  if necessary, the need for more tests. Vaccines  Your health care provider may recommend certain vaccines, such as:  Influenza vaccine. This is recommended every year.  Tetanus, diphtheria, and acellular pertussis (Tdap, Td) vaccine.  You may need a Td booster every 10 years.  Zoster vaccine. You may need this after age 56.  Pneumococcal 13-valent conjugate (PCV13) vaccine. One dose is recommended after age 63.  Pneumococcal polysaccharide (PPSV23) vaccine. One dose is recommended after age 65. Talk to your health care provider about which screenings and vaccines you need and how often you need them. This information is not intended to replace advice given to you by your health care provider. Make sure you discuss any questions you have with your health care provider. Document Released: 02/03/2015 Document Revised: 09/27/2015 Document Reviewed: 11/08/2014 Elsevier Interactive Patient Education  2017 [employer].  Fall Prevention in the Home Falls can cause injuries. They can happen to people of all ages. There are many things you can do to make your home safe and to help prevent falls. What can I do on the outside of my home?  Regularly fix the edges of walkways and driveways and fix any cracks.  Remove anything that might make you trip as you walk through a door, such as a raised step or threshold.  Trim any bushes or trees on the path to your home.  Use bright outdoor lighting.  Clear any walking paths of anything that might make someone trip, such as rocks or tools.  Regularly check to see if handrails are loose or broken. Make sure that both sides of any steps have handrails.  Any raised decks and porches should have guardrails on the edges.  Have any leaves, snow, or ice cleared regularly.  Use sand or salt on walking paths during winter.  Clean up any spills in your garage right away. This includes oil or grease spills. What can I do in the bathroom?  Use night lights.  Install grab bars by the toilet and in the tub and shower. Do not use towel bars as grab bars.  Use non-skid mats or decals in the tub or shower.  If you need to sit down in the shower, use a plastic, non-slip stool.  Keep the  floor dry. Clean up any water that spills on the floor as soon as it happens.  Remove soap buildup in the tub or shower regularly.  Attach bath mats securely with double-sided non-slip rug tape.  Do not have throw rugs and other things on the floor that can make you trip. What can I do in the bedroom?  Use night lights.  Make sure that you have a light by your bed that is easy to reach.  Do not use any sheets or blankets that are too big for your bed. They should not hang down onto the floor.  Have a firm chair that has side arms. You can use this for support while you get dressed.  Do not have throw rugs and other things on the floor that can make you trip. What can I do in the kitchen?  Clean up any spills right away.  Avoid walking on wet floors.  Keep items that you use a lot in easy-to-reach places.  If you need to reach something above you, use a strong step stool that has a grab bar.  Keep electrical cords out of the way.  Do not use floor polish or wax that makes floors slippery. If you  must use wax, use non-skid floor wax.  Do not have throw rugs and other things on the floor that can make you trip. What can I do with my stairs?  Do not leave any items on the stairs.  Make sure that there are handrails on both sides of the stairs and use them. Fix handrails that are broken or loose. Make sure that handrails are as long as the stairways.  Check any carpeting to make sure that it is firmly attached to the stairs. Fix any carpet that is loose or worn.  Avoid having throw rugs at the top or bottom of the stairs. If you do have throw rugs, attach them to the floor with carpet tape.  Make sure that you have a light switch at the top of the stairs and the bottom of the stairs. If you do not have them, ask someone to add them for you. What else can I do to help prevent falls?  Wear shoes that:  Do not have high heels.  Have rubber bottoms.  Are comfortable and fit  you well.  Are closed at the toe. Do not wear sandals.  If you use a stepladder:  Make sure that it is fully opened. Do not climb a closed stepladder.  Make sure that both sides of the stepladder are locked into place.  Ask someone to hold it for you, if possible.  Clearly mark and make sure that you can see:  Any grab bars or handrails.  First and last steps.  Where the edge of each step is.  Use tools that help you move around (mobility aids) if they are needed. These include:  Canes.  Walkers.  Scooters.  Crutches.  Turn on the lights when you go into a dark area. Replace any light bulbs as soon as they burn out.  Set up your furniture so you have a clear path. Avoid moving your furniture around.  If any of your floors are uneven, fix them.  If there are any pets around you, be aware of where they are.  Review your medicines with your doctor. Some medicines can make you feel dizzy. This can increase your chance of falling. Ask your doctor what other things that you can do to help prevent falls. This information is not intended to replace advice given to you by your health care provider. Make sure you discuss any questions you have with your health care provider. Document Released: 11/03/2008 Document Revised: 06/15/2015 Document Reviewed: 02/11/2014 Elsevier Interactive Patient Education  2017 [employer].

## 2019-11-17 ENCOUNTER — Encounter: Payer: Self-pay | Admitting: Family Medicine

## 2020-02-09 ENCOUNTER — Other Ambulatory Visit: Payer: Self-pay

## 2020-02-09 ENCOUNTER — Encounter: Payer: Self-pay | Admitting: Family Medicine

## 2020-02-09 ENCOUNTER — Ambulatory Visit (INDEPENDENT_AMBULATORY_CARE_PROVIDER_SITE_OTHER): Payer: Medicare Other | Admitting: Family Medicine

## 2020-02-09 VITALS — BP 124/80 | HR 72 | Temp 98.7°F | Ht 68.0 in | Wt 235.0 lb

## 2020-02-09 DIAGNOSIS — I10 Essential (primary) hypertension: Secondary | ICD-10-CM

## 2020-02-09 DIAGNOSIS — Z6835 Body mass index (BMI) 35.0-35.9, adult: Secondary | ICD-10-CM

## 2020-02-09 DIAGNOSIS — B009 Herpesviral infection, unspecified: Secondary | ICD-10-CM | POA: Diagnosis not present

## 2020-02-09 DIAGNOSIS — E782 Mixed hyperlipidemia: Secondary | ICD-10-CM | POA: Diagnosis not present

## 2020-02-09 MED ORDER — SIMVASTATIN 40 MG PO TABS: 40.0000 mg | ORAL_TABLET | Freq: Every day | ORAL | 1 refills | Status: DC

## 2020-02-09 MED ORDER — LOSARTAN POTASSIUM 50 MG PO TABS: 50.0000 mg | ORAL_TABLET | Freq: Every day | ORAL | 1 refills | Status: DC

## 2020-02-09 MED ORDER — VALACYCLOVIR HCL 1 G PO TABS: 1000.0000 mg | ORAL_TABLET | Freq: Two times a day (BID) | ORAL | 1 refills | Status: DC

## 2020-02-09 NOTE — Patient Instructions (Signed)
Why follow it? Research shows. . Those who follow the Mediterranean diet have a reduced risk of heart disease  . The diet is associated with a reduced incidence of Parkinson's and Alzheimer's diseases . People following the diet may have longer life expectancies and lower rates of chronic diseases  . The Dietary Guidelines for Americans recommends the Mediterranean diet as an eating plan to promote health and prevent disease  What Is the Mediterranean Diet?  . Healthy eating plan based on typical foods and recipes of Mediterranean-style cooking . The diet is primarily a plant based diet; these foods should make up a majority of meals   Starches - Plant based foods should make up a majority of meals - They are an important sources of vitamins, minerals, energy, antioxidants, and fiber - Choose whole grains, foods high in fiber and minimally processed items  - Typical grain sources include wheat, oats, barley, corn, brown rice, bulgar, farro, millet, polenta, couscous  - Various types of beans include chickpeas, lentils, fava beans, black beans, Schetter beans   Fruits  Veggies - Large quantities of antioxidant rich fruits & veggies; 6 or more servings  - Vegetables can be eaten raw or lightly drizzled with oil and cooked  - Vegetables common to the traditional Mediterranean Diet include: artichokes, arugula, beets, broccoli, brussel sprouts, cabbage, carrots, celery, collard greens, cucumbers, eggplant, kale, leeks, lemons, lettuce, mushrooms, okra, onions, peas, peppers, potatoes, pumpkin, radishes, rutabaga, shallots, spinach, sweet potatoes, turnips, zucchini - Fruits common to the Mediterranean Diet include: apples, apricots, avocados, cherries, clementines, dates, figs, grapefruits, grapes, melons, nectarines, oranges, peaches, pears, pomegranates, strawberries, tangerines  Fats - Replace butter and margarine with healthy oils, such as olive oil, canola oil, and tahini  - Limit nuts to no  more than a handful a day  - Nuts include walnuts, almonds, pecans, pistachios, pine nuts  - Limit or avoid candied, honey roasted or heavily salted nuts - Olives are central to the Mediterranean diet - can be eaten whole or used in a variety of dishes   Meats Protein - Limiting red meat: no more than a few times a month - When eating red meat: choose lean cuts and keep the portion to the size of deck of cards - Eggs: approx. 0 to 4 times a week  - Fish and lean poultry: at least 2 a week  - Healthy protein sources include, chicken, turkey, lean beef, lamb - Increase intake of seafood such as tuna, salmon, trout, mackerel, shrimp, scallops - Avoid or limit high fat processed meats such as sausage and bacon  Dairy - Include moderate amounts of low fat dairy products  - Focus on healthy dairy such as fat free yogurt, skim milk, low or reduced fat cheese - Limit dairy products higher in fat such as whole or 2% milk, cheese, ice cream  Alcohol - Moderate amounts of red wine is ok  - No more than 5 oz daily for women (all ages) and men older than age 65  - No more than 10 oz of wine daily for men younger than 65  Other - Limit sweets and other desserts  - Use herbs and spices instead of salt to flavor foods  - Herbs and spices common to the traditional Mediterranean Diet include: basil, bay leaves, chives, cloves, cumin, fennel, garlic, lavender, marjoram, mint, oregano, parsley, pepper, rosemary, sage, savory, sumac, tarragon, thyme   It's not just a diet, it's a lifestyle:  . The Mediterranean diet includes   lifestyle factors typical of those in the region  . Foods, drinks and meals are best eaten with others and savored . Daily physical activity is important for overall good health . This could be strenuous exercise like running and aerobics . This could also be more leisurely activities such as walking, housework, yard-work, or taking the stairs . Moderation is the key; a balanced and  healthy diet accommodates most foods and drinks . Consider portion sizes and frequency of consumption of certain foods   Meal Ideas & Options:  . Breakfast:  o Whole wheat toast or whole wheat English muffins with peanut butter & hard boiled egg o Steel cut oats topped with apples & cinnamon and skim milk  o Fresh fruit: banana, strawberries, melon, berries, peaches  o Smoothies: strawberries, bananas, greek yogurt, peanut butter o Low fat greek yogurt with blueberries and granola  o Egg Fetters omelet with spinach and mushrooms o Breakfast couscous: whole wheat couscous, apricots, skim milk, cranberries  . Sandwiches:  o Hummus and grilled vegetables (peppers, zucchini, squash) on whole wheat bread   o Grilled chicken on whole wheat pita with lettuce, tomatoes, cucumbers or tzatziki  o Tuna salad on whole wheat bread: tuna salad made with greek yogurt, olives, red peppers, capers, green onions o Garlic rosemary lamb pita: lamb sauted with garlic, rosemary, salt & pepper; add lettuce, cucumber, greek yogurt to pita - flavor with lemon juice and black pepper  . Seafood:  o Mediterranean grilled salmon, seasoned with garlic, basil, parsley, lemon juice and black pepper o Shrimp, lemon, and spinach whole-grain pasta salad made with low fat greek yogurt  o Seared scallops with lemon orzo  o Seared tuna steaks seasoned salt, pepper, coriander topped with tomato mixture of olives, tomatoes, olive oil, minced garlic, parsley, green onions and cappers  . Meats:  o Herbed greek chicken salad with kalamata olives, cucumber, feta  o Red bell peppers stuffed with spinach, bulgur, lean ground beef (or lentils) & topped with feta   o Kebabs: skewers of chicken, tomatoes, onions, zucchini, squash  o Turkey burgers: made with red onions, mint, dill, lemon juice, feta cheese topped with roasted red peppers . Vegetarian o Cucumber salad: cucumbers, artichoke hearts, celery, red onion, feta cheese, tossed in  olive oil & lemon juice  o Hummus and whole grain pita points with a greek salad (lettuce, tomato, feta, olives, cucumbers, red onion) o Lentil soup with celery, carrots made with vegetable broth, garlic, salt and pepper  o Tabouli salad: parsley, bulgur, mint, scallions, cucumbers, tomato, radishes, lemon juice, olive oil, salt and pepper.      

## 2020-02-09 NOTE — Progress Notes (Signed)
Date:  02/09/2020   Name:  Katrina Crawford   DOB:  09/24/1955   MRN:  [id number]   Chief Complaint: Hyperlipidemia, Hypertension, and viral syndrome  Hyperlipidemia This is a chronic problem. The current episode started more than 1 year ago. The problem is controlled. Recent lipid tests were reviewed and are normal. She has no history of chronic renal disease, diabetes, hypothyroidism, liver disease, obesity or nephrotic syndrome. There are no known factors aggravating her hyperlipidemia. Pertinent negatives include no chest pain, focal sensory loss, focal weakness, leg pain, myalgias or shortness of breath. Current antihyperlipidemic treatment includes statins. The current treatment provides moderate improvement of lipids. There are no compliance problems.  Risk factors for coronary artery disease include dyslipidemia and hypertension.  Hypertension This is a chronic problem. The current episode started more than 1 year ago. The problem has been gradually improving since onset. The problem is controlled. Pertinent negatives include no anxiety, blurred vision, chest pain, headaches, malaise/fatigue, neck pain, orthopnea, palpitations, peripheral edema, PND, shortness of breath or sweats. Risk factors for coronary artery disease include dyslipidemia. Past treatments include diuretics. The current treatment provides mild improvement. There are no compliance problems.  There is no history of angina, kidney disease, CAD/MI, CVA, heart failure, left ventricular hypertrophy, PVD or retinopathy. There is no history of chronic renal disease, a hypertension causing med or renovascular disease.    Lab Results  Component Value Date   CREATININE 0.74 08/09/2019   BUN 12 08/09/2019   NA 141 08/09/2019   K 4.8 08/09/2019   CL 100 08/09/2019   CO2 26 08/09/2019   Lab Results  Component Value Date   CHOL 206 (H) 08/09/2019   HDL 63 08/09/2019   LDLCALC 125 (H) 08/09/2019   TRIG 99 08/09/2019   CHOLHDL  3.5 07/01/2017   No results found for: TSH No results found for: HGBA1C Lab Results  Component Value Date   WBC 5.9 02/08/2019   HGB 14.6 02/08/2019   HCT 44.3 02/08/2019   MCV 98 (H) 02/08/2019   PLT 249 02/08/2019   Lab Results  Component Value Date   ALT 16 02/08/2019   AST 22 02/08/2019   ALKPHOS 64 02/08/2019   BILITOT 0.6 02/08/2019     Review of Systems  Constitutional: Negative.  Negative for chills, fatigue, fever, malaise/fatigue and unexpected weight change.  HENT: Negative for congestion, ear discharge, ear pain, rhinorrhea, sinus pressure, sneezing and sore throat.   Eyes: Negative for blurred vision, double vision, photophobia, pain, discharge, redness and itching.  Respiratory: Negative for cough, shortness of breath, wheezing and stridor.   Cardiovascular: Negative for chest pain, palpitations, orthopnea and PND.  Gastrointestinal: Negative for abdominal pain, blood in stool, constipation, diarrhea, nausea and vomiting.  Endocrine: Negative for cold intolerance, heat intolerance, polydipsia, polyphagia and polyuria.  Genitourinary: Negative for dysuria, flank pain, frequency, hematuria, menstrual problem, pelvic pain, urgency, vaginal bleeding and vaginal discharge.  Musculoskeletal: Negative for arthralgias, back pain, myalgias and neck pain.  Skin: Negative for rash.  Allergic/Immunologic: Negative for environmental allergies and food allergies.  Neurological: Negative for dizziness, focal weakness, weakness, light-headedness, numbness and headaches.  Hematological: Negative for adenopathy. Does not bruise/bleed easily.  Psychiatric/Behavioral: Negative for dysphoric mood. The patient is not nervous/anxious.     Patient Active Problem List   Diagnosis Date Noted  . Family history of malignant neoplasm of gastrointestinal tract   . Essential hypertension 05/02/2016  . Mixed hyperlipidemia 05/02/2016  . HSV-1 infection 05/02/2016  Allergies  Allergen  Reactions  . Codeine Nausea Only  . Penicillins Rash  . Sulfa Antibiotics Rash    Past Surgical History:  Procedure Laterality Date  . APPENDECTOMY    . COLONOSCOPY  08/2013   cleared for 5 yrs- Dr Servando Snare  . COLONOSCOPY WITH PROPOFOL N/A 09/04/2018   Procedure: COLONOSCOPY WITH PROPOFOL;  Surgeon: Midge Minium, MD;  Location: [facility];  Service: Endoscopy;  Laterality: N/A;  . JOINT REPLACEMENT  11/18/2017  . REPLACEMENT TOTAL KNEE Left 09/17/2017  . VAGINAL HYSTERECTOMY      Social History   Tobacco Use  . Smoking status: Never Smoker  . Smokeless tobacco: Never Used  Vaping Use  . Vaping Use: Never used  Substance Use Topics  . Alcohol use: Yes    Alcohol/week: 18.0 standard drinks    Types: 18 Cans of beer per week  . Drug use: No     Medication list has been reviewed and updated.  Current Meds  Medication Sig  . aspirin 81 MG tablet Take 81 mg by mouth daily.  . Cholecalciferol (DIALYVITE VITAMIN D 5000 PO) Take 1 capsule by mouth 3 (three) times a week.  . hydrochlorothiazide (HYDRODIURIL) 25 MG tablet Take 1 tablet (25 mg total) by mouth daily.  Marland Kitchen L-Lysine 1000 MG TABS Take 2 tablets (2,000 mg total) by mouth daily. otc  . simvastatin (ZOCOR) 40 MG tablet Take 1 tablet (40 mg total) by mouth daily.  . valACYclovir (VALTREX) 1000 MG tablet Take 1 tablet (1,000 mg total) by mouth 2 (two) times daily. As needed for breakout  . vitamin B-12 (CYANOCOBALAMIN) 1000 MCG tablet Take 2,000 mcg by mouth daily.    PHQ 2/9 Scores 10/20/2019 08/09/2019 08/10/2018 07/22/2018  PHQ - 2 Score 0 0 0 0  PHQ- 9 Score - 0 0 0    GAD 7 : Generalized Anxiety Score 08/09/2019  Nervous, Anxious, on Edge 1  Control/stop worrying 0  Worry too much - different things 0  Trouble relaxing 0  Restless 0  Easily annoyed or irritable 0  Afraid - awful might happen 0  Total GAD 7 Score 1  Anxiety Difficulty Not difficult at all    BP Readings from Last 3 Encounters:  02/09/20  124/80  10/16/19 (!) 161/100  08/09/19 120/64    Physical Exam Vitals and nursing note reviewed.  Constitutional:      Appearance: She is well-developed and well-nourished.  HENT:     Head: Normocephalic.     Right Ear: Tympanic membrane, ear canal and external ear normal. There is no impacted cerumen.     Left Ear: Tympanic membrane, ear canal and external ear normal. There is no impacted cerumen.     Nose: Nose normal.     Mouth/Throat:     Mouth: Oropharynx is clear and moist. Mucous membranes are moist.  Eyes:     General: Lids are everted, no foreign bodies appreciated. No scleral icterus.       Left eye: No foreign body or hordeolum.     Extraocular Movements: EOM normal.     Conjunctiva/sclera: Conjunctivae normal.     Right eye: Right conjunctiva is not injected.     Left eye: Left conjunctiva is not injected.     Pupils: Pupils are equal, round, and reactive to light.  Neck:     Thyroid: No thyromegaly.     Vascular: No carotid bruit or JVD.     Trachea: No tracheal deviation.  Cardiovascular:  Rate and Rhythm: Normal rate and regular rhythm.     Pulses: Intact distal pulses.     Heart sounds: Normal heart sounds. No murmur heard. No friction rub. No gallop.   Pulmonary:     Effort: Pulmonary effort is normal. No respiratory distress.     Breath sounds: Normal breath sounds. No wheezing, rhonchi or rales.  Abdominal:     General: Bowel sounds are normal.     Palpations: Abdomen is soft. There is no hepatosplenomegaly or mass.     Tenderness: There is no abdominal tenderness. There is no guarding or rebound.  Musculoskeletal:        General: No tenderness or edema. Normal range of motion.     Cervical back: Normal range of motion and neck supple.  Lymphadenopathy:     Cervical: No cervical adenopathy.  Skin:    General: Skin is warm.     Capillary Refill: Capillary refill takes less than 2 seconds.     Findings: No rash.  Neurological:     Mental Status:  She is alert and oriented to person, place, and time.     Cranial Nerves: No cranial nerve deficit.     Deep Tendon Reflexes: Strength normal. Reflexes normal.  Psychiatric:        Mood and Affect: Mood and affect normal. Mood is not anxious or depressed.     Wt Readings from Last 3 Encounters:  02/09/20 235 lb (106.6 kg)  10/16/19 220 lb (99.8 kg)  08/09/19 223 lb (101.2 kg)    BP 124/80   Pulse 72   Temp 98.7 F (37.1 C) (Oral)   Ht 5\' 8"  (1.727 m)   Wt 235 lb (106.6 kg)   BMI 35.73 kg/m   Assessment and Plan:  1. Essential hypertension Chronic.  Controlled.  Stable.  Blood pressure is excellent 124/80.  Patient was having a discussion with her dentist who suggested her loss of taste may be secondary to her hydrochlorothiazide.  We will discontinue her hydrochlorothiazide and replace it with losartan 50 mg once a day.  We will recheck her blood pressure in 6 weeks.  2. HSV-1 infection Chronic.  Controlled.  Stable.  Patient has episodic breakouts of HSV 1.  Patient has been given a refill on her valacyclovir. - valACYclovir (VALTREX) 1000 MG tablet; Take 1 tablet (1,000 mg total) by mouth 2 (two) times daily. As needed for breakout  Dispense: 20 tablet; Refill: 1  3. Mixed hyperlipidemia Chronic.  Controlled.  Stable.  Patient currently on simvastatin 40 mg once a day.  We will recheck a lipid panel. - simvastatin (ZOCOR) 40 MG tablet; Take 1 tablet (40 mg total) by mouth daily.  Dispense: 90 tablet; Refill: 1 - Lipid Panel With LDL/HDL Ratio  4. BMI 35.0-35.9,adult Chronic.  Recent exacerbation.  Stable.  Patient's had some weight gain secondary to a fracture of the fifth metatarsal on the left foot.  Patient is doing reasonably better with the foot status post treatment however patient is going to need to lose some weight and a Mediterranean diet sheet has been suggested.  We will check for weight loss in 6 weeks.

## 2020-02-10 ENCOUNTER — Other Ambulatory Visit: Payer: Self-pay

## 2020-02-10 DIAGNOSIS — J029 Acute pharyngitis, unspecified: Secondary | ICD-10-CM

## 2020-02-10 LAB — LIPID PANEL WITH LDL/HDL RATIO
Cholesterol, Total: 199 mg/dL (ref 100–199)
HDL: 64 mg/dL (ref >39)
LDL Chol Calc (NIH): 116 mg/dL — ABNORMAL HIGH (ref 0–99)
LDL/HDL Ratio: 1.8 ratio (ref 0.0–3.2)
Triglycerides: 110 mg/dL (ref 0–149)
VLDL Cholesterol Cal: 19 mg/dL (ref 5–40)

## 2020-02-10 MED ORDER — AZITHROMYCIN 250 MG PO TABS
ORAL_TABLET | ORAL | 0 refills | Status: DC
Start: 2020-02-10 — End: 2020-03-22

## 2020-02-10 NOTE — Progress Notes (Signed)
Sent in [employer] 

## 2020-03-22 ENCOUNTER — Ambulatory Visit (INDEPENDENT_AMBULATORY_CARE_PROVIDER_SITE_OTHER): Payer: Medicare Other | Admitting: Family Medicine

## 2020-03-22 ENCOUNTER — Other Ambulatory Visit: Payer: Self-pay | Admitting: Family Medicine

## 2020-03-22 ENCOUNTER — Other Ambulatory Visit: Payer: Self-pay

## 2020-03-22 ENCOUNTER — Encounter: Payer: Self-pay | Admitting: Family Medicine

## 2020-03-22 VITALS — BP 138/90 | HR 76 | Ht 68.0 in | Wt 237.0 lb

## 2020-03-22 DIAGNOSIS — I10 Essential (primary) hypertension: Secondary | ICD-10-CM

## 2020-03-22 MED ORDER — HYDROCHLOROTHIAZIDE 25 MG PO TABS: 25.0000 mg | ORAL_TABLET | Freq: Every day | ORAL | 31 refills | Status: DC

## 2020-03-22 MED ORDER — LOSARTAN POTASSIUM 50 MG PO TABS: 50.0000 mg | ORAL_TABLET | Freq: Every day | ORAL | 1 refills | Status: DC

## 2020-03-22 NOTE — Progress Notes (Signed)
Date:  03/22/2020   Name:  Katrina Crawford   DOB:  07-02-1955   MRN:  [id number]   Chief Complaint: Hypertension (Follow up/ took out the HCTZ)  Hypertension This is a chronic problem. The current episode started more than 1 year ago. The problem has been gradually worsening since onset. The problem is uncontrolled. Pertinent negatives include no anxiety, blurred vision, chest pain, headaches, malaise/fatigue, neck pain, orthopnea, palpitations, peripheral edema, PND, shortness of breath or sweats. There are no associated agents to hypertension. There are no known risk factors for coronary artery disease. Past treatments include angiotensin blockers and diuretics. The current treatment provides moderate improvement. There are no compliance problems.  There is no history of angina, kidney disease, CAD/MI, CVA, heart failure, left ventricular hypertrophy, PVD or retinopathy. There is no history of chronic renal disease, a hypertension causing med or renovascular disease.    Lab Results  Component Value Date   CREATININE 0.74 08/09/2019   BUN 12 08/09/2019   NA 141 08/09/2019   K 4.8 08/09/2019   CL 100 08/09/2019   CO2 26 08/09/2019   Lab Results  Component Value Date   CHOL 199 02/09/2020   HDL 64 02/09/2020   LDLCALC 116 (H) 02/09/2020   TRIG 110 02/09/2020   CHOLHDL 3.5 07/01/2017   No results found for: TSH No results found for: HGBA1C Lab Results  Component Value Date   WBC 5.9 02/08/2019   HGB 14.6 02/08/2019   HCT 44.3 02/08/2019   MCV 98 (H) 02/08/2019   PLT 249 02/08/2019   Lab Results  Component Value Date   ALT 16 02/08/2019   AST 22 02/08/2019   ALKPHOS 64 02/08/2019   BILITOT 0.6 02/08/2019     Review of Systems  Constitutional: Negative.  Negative for chills, fatigue, fever, malaise/fatigue and unexpected weight change.  HENT: Negative for congestion, ear discharge, ear pain, nosebleeds, rhinorrhea, sinus pressure, sneezing and sore throat.   Eyes:  Negative for blurred vision, double vision, photophobia, pain, discharge, redness and itching.  Respiratory: Negative for cough, shortness of breath, wheezing and stridor.   Cardiovascular: Negative for chest pain, palpitations, orthopnea, leg swelling and PND.  Gastrointestinal: Negative for abdominal pain, blood in stool, constipation, diarrhea, nausea and vomiting.  Endocrine: Negative for cold intolerance, heat intolerance, polydipsia, polyphagia and polyuria.  Genitourinary: Negative for dysuria, flank pain, frequency, hematuria, menstrual problem, pelvic pain, urgency, vaginal bleeding and vaginal discharge.  Musculoskeletal: Negative for arthralgias, back pain, myalgias and neck pain.  Skin: Negative for rash.  Allergic/Immunologic: Negative for environmental allergies and food allergies.  Neurological: Negative for dizziness, weakness, light-headedness, numbness and headaches.  Hematological: Negative for adenopathy. Does not bruise/bleed easily.  Psychiatric/Behavioral: Negative for dysphoric mood. The patient is not nervous/anxious.     Patient Active Problem List   Diagnosis Date Noted  . Family history of malignant neoplasm of gastrointestinal tract   . Essential hypertension 05/02/2016  . Mixed hyperlipidemia 05/02/2016  . HSV-1 infection 05/02/2016    Allergies  Allergen Reactions  . Codeine Nausea Only  . Penicillins Rash  . Sulfa Antibiotics Rash    Past Surgical History:  Procedure Laterality Date  . APPENDECTOMY    . COLONOSCOPY  08/2013   cleared for 5 yrs- Dr Servando Snare  . COLONOSCOPY WITH PROPOFOL N/A 09/04/2018   Procedure: COLONOSCOPY WITH PROPOFOL;  Surgeon: Midge Minium, MD;  Location: [facility];  Service: Endoscopy;  Laterality: N/A;  . JOINT REPLACEMENT  11/18/2017  .  REPLACEMENT TOTAL KNEE Left 09/17/2017  . VAGINAL HYSTERECTOMY      Social History   Tobacco Use  . Smoking status: Never Smoker  . Smokeless tobacco: Never Used  Vaping Use   . Vaping Use: Never used  Substance Use Topics  . Alcohol use: Yes    Alcohol/week: 18.0 standard drinks    Types: 18 Cans of beer per week  . Drug use: No     Medication list has been reviewed and updated.  Current Meds  Medication Sig  . aspirin 81 MG tablet Take 81 mg by mouth daily.  . Cholecalciferol (DIALYVITE VITAMIN D 5000 PO) Take 1 capsule by mouth 3 (three) times a week.  . L-Lysine 1000 MG TABS Take 2 tablets (2,000 mg total) by mouth daily. otc  . losartan (COZAAR) 50 MG tablet Take 1 tablet (50 mg total) by mouth daily.  . simvastatin (ZOCOR) 40 MG tablet Take 1 tablet (40 mg total) by mouth daily.  . valACYclovir (VALTREX) 1000 MG tablet Take 1 tablet (1,000 mg total) by mouth 2 (two) times daily. As needed for breakout  . vitamin B-12 (CYANOCOBALAMIN) 1000 MCG tablet Take 2,000 mcg by mouth daily.    PHQ 2/9 Scores 03/22/2020 10/20/2019 08/09/2019 08/10/2018  PHQ - 2 Score 0 0 0 0  PHQ- 9 Score 0 - 0 0    GAD 7 : Generalized Anxiety Score 03/22/2020 08/09/2019  Nervous, Anxious, on Edge 0 1  Control/stop worrying 0 0  Worry too much - different things 0 0  Trouble relaxing 0 0  Restless 0 0  Easily annoyed or irritable 0 0  Afraid - awful might happen 0 0  Total GAD 7 Score 0 1  Anxiety Difficulty - Not difficult at all    BP Readings from Last 3 Encounters:  03/22/20 138/90  02/09/20 124/80  10/16/19 (!) 161/100    Physical Exam Vitals and nursing note reviewed.  Constitutional:      Appearance: She is well-developed and well-nourished.  HENT:     Head: Normocephalic.     Right Ear: Tympanic membrane, ear canal and external ear normal. There is no impacted cerumen.     Left Ear: Tympanic membrane, ear canal and external ear normal. There is no impacted cerumen.     Nose: Nose normal. No congestion or rhinorrhea.     Mouth/Throat:     Mouth: Oropharynx is clear and moist. Mucous membranes are moist.  Eyes:     General: Lids are everted, no foreign  bodies appreciated. No scleral icterus.       Left eye: No foreign body or hordeolum.     Extraocular Movements: EOM normal.     Conjunctiva/sclera: Conjunctivae normal.     Right eye: Right conjunctiva is not injected.     Left eye: Left conjunctiva is not injected.     Pupils: Pupils are equal, round, and reactive to light.  Neck:     Thyroid: No thyromegaly.     Vascular: No JVD.     Trachea: No tracheal deviation.  Cardiovascular:     Rate and Rhythm: Normal rate and regular rhythm.     Pulses: Intact distal pulses.     Heart sounds: Normal heart sounds. No murmur heard. No friction rub. No gallop.   Pulmonary:     Effort: Pulmonary effort is normal. No respiratory distress.     Breath sounds: Normal breath sounds. No wheezing, rhonchi or rales.  Abdominal:  General: Bowel sounds are normal.     Palpations: Abdomen is soft. There is no hepatosplenomegaly or mass.     Tenderness: There is no abdominal tenderness. There is no guarding or rebound.  Musculoskeletal:        General: No tenderness or edema. Normal range of motion.     Cervical back: Normal range of motion and neck supple.  Lymphadenopathy:     Cervical: No cervical adenopathy.  Skin:    General: Skin is warm.     Findings: No rash.  Neurological:     Mental Status: She is alert and oriented to person, place, and time.     Cranial Nerves: No cranial nerve deficit.     Deep Tendon Reflexes: Strength normal. Reflexes normal.  Psychiatric:        Mood and Affect: Mood and affect normal. Mood is not anxious or depressed.     Wt Readings from Last 3 Encounters:  03/22/20 237 lb (107.5 kg)  02/09/20 235 lb (106.6 kg)  10/16/19 220 lb (99.8 kg)    BP 138/90   Pulse 76   Ht 5\' 8"  (1.727 m)   Wt 237 lb (107.5 kg)   BMI 36.04 kg/m   Assessment and Plan:  1. Essential hypertension Chronic.  Controlled.  Stable.  Blood pressure is 138/90.  We will continue the losartan 50 mg once a day but add back her  hydrochlorothiazide 25 mg once a day.  We will get a baseline CMP for electrolytes and GFR and recheck in 6 months. - Comprehensive Metabolic Panel (CMET) - hydrochlorothiazide (HYDRODIURIL) 25 MG tablet; Take 1 tablet (25 mg total) by mouth daily.  Dispense: 90 tablet; Refill: 31 - losartan (COZAAR) 50 MG tablet; Take 1 tablet (50 mg total) by mouth daily.  Dispense: 90 tablet; Refill: 1

## 2020-03-23 LAB — COMPREHENSIVE METABOLIC PANEL
ALT: 17 IU/L (ref 0–32)
AST: 20 IU/L (ref 0–40)
Albumin/Globulin Ratio: 1.7 (ref 1.2–2.2)
Albumin: 4.5 g/dL (ref 3.8–4.8)
Alkaline Phosphatase: 77 IU/L (ref 44–121)
BUN/Creatinine Ratio: 19 (ref 12–28)
BUN: 13 mg/dL (ref 8–27)
Bilirubin Total: 0.7 mg/dL (ref 0.0–1.2)
CO2: 23 mmol/L (ref 20–29)
Calcium: 9.9 mg/dL (ref 8.7–10.3)
Chloride: 102 mmol/L (ref 96–106)
Creatinine, Ser: 0.7 mg/dL (ref 0.57–1.00)
Globulin, Total: 2.7 g/dL (ref 1.5–4.5)
Glucose: 91 mg/dL (ref 65–99)
Potassium: 4.7 mmol/L (ref 3.5–5.2)
Sodium: 141 mmol/L (ref 134–144)
Total Protein: 7.2 g/dL (ref 6.0–8.5)
eGFR: 95 mL/min/{1.73_m2} (ref >59)

## 2020-06-30 ENCOUNTER — Other Ambulatory Visit: Payer: Self-pay | Admitting: Family Medicine

## 2020-06-30 DIAGNOSIS — Z1231 Encounter for screening mammogram for malignant neoplasm of breast: Secondary | ICD-10-CM

## 2020-08-02 ENCOUNTER — Ambulatory Visit
Admission: RE | Admit: 2020-08-02 | Discharge: 2020-08-02 | Disposition: A | Discharge status: Home / Self Care | Payer: Medicare Other | Source: Ambulatory Visit | Attending: Family Medicine | Admitting: Family Medicine

## 2020-08-02 ENCOUNTER — Other Ambulatory Visit: Payer: Self-pay

## 2020-08-02 DIAGNOSIS — Z1231 Encounter for screening mammogram for malignant neoplasm of breast: Secondary | ICD-10-CM | POA: Insufficient documentation

## 2020-08-10 ENCOUNTER — Encounter: Payer: Self-pay | Admitting: Family Medicine

## 2020-08-10 ENCOUNTER — Other Ambulatory Visit: Payer: Self-pay

## 2020-08-10 ENCOUNTER — Ambulatory Visit (INDEPENDENT_AMBULATORY_CARE_PROVIDER_SITE_OTHER): Payer: Medicare Other | Admitting: Family Medicine

## 2020-08-10 VITALS — BP 138/80 | HR 80 | Ht 68.0 in | Wt 237.0 lb

## 2020-08-10 DIAGNOSIS — B009 Herpesviral infection, unspecified: Secondary | ICD-10-CM | POA: Diagnosis not present

## 2020-08-10 DIAGNOSIS — I10 Essential (primary) hypertension: Secondary | ICD-10-CM

## 2020-08-10 DIAGNOSIS — E782 Mixed hyperlipidemia: Secondary | ICD-10-CM

## 2020-08-10 MED ORDER — SIMVASTATIN 40 MG PO TABS: 40.0000 mg | ORAL_TABLET | Freq: Every day | ORAL | 1 refills | Status: DC

## 2020-08-10 MED ORDER — HYDROCHLOROTHIAZIDE 25 MG PO TABS: 25.0000 mg | ORAL_TABLET | Freq: Every day | ORAL | 1 refills | Status: DC

## 2020-08-10 MED ORDER — VALACYCLOVIR HCL 1 G PO TABS: 1000.0000 mg | ORAL_TABLET | Freq: Two times a day (BID) | ORAL | 1 refills | Status: DC

## 2020-08-10 MED ORDER — LOSARTAN POTASSIUM 50 MG PO TABS: 50.0000 mg | ORAL_TABLET | Freq: Every day | ORAL | 1 refills | Status: DC

## 2020-08-10 NOTE — Progress Notes (Signed)
Date:  08/10/2020   Name:  Katrina Crawford   DOB:  [birth year]   MRN:  [id number]   Chief Complaint: Hypertension, Hyperlipidemia, and viral syndrome  Hypertension This is a chronic problem. The current episode started more than 1 year ago. The problem has been gradually improving since onset. The problem is controlled. Pertinent negatives include no anxiety, blurred vision, chest pain, headaches, malaise/fatigue, neck pain, orthopnea, palpitations, peripheral edema, PND, shortness of breath or sweats. There are no associated agents to hypertension. Risk factors for coronary artery disease include dyslipidemia. Past treatments include angiotensin blockers and diuretics. The current treatment provides moderate improvement. There are no compliance problems.  There is no history of angina, kidney disease, CAD/MI, CVA, heart failure, left ventricular hypertrophy, PVD or retinopathy. There is no history of chronic renal disease, a hypertension causing med or renovascular disease.  Hyperlipidemia This is a chronic problem. The current episode started more than 1 year ago. The problem is controlled. Recent lipid tests were reviewed and are normal. She has no history of chronic renal disease, diabetes, hypothyroidism, liver disease, obesity or nephrotic syndrome. Factors aggravating her hyperlipidemia include thiazides. Pertinent negatives include no chest pain, myalgias or shortness of breath. Current antihyperlipidemic treatment includes statins. The current treatment provides moderate improvement of lipids. There are no compliance problems.  Risk factors for coronary artery disease include dyslipidemia and hypertension.   Lab Results  Component Value Date   CREATININE 0.70 03/22/2020   BUN 13 03/22/2020   NA 141 03/22/2020   K 4.7 03/22/2020   CL 102 03/22/2020   CO2 23 03/22/2020   Lab Results  Component Value Date   CHOL 199 02/09/2020   HDL 64 02/09/2020   LDLCALC 116 (H) 02/09/2020   TRIG  110 02/09/2020   CHOLHDL 3.5 07/01/2017   No results found for: TSH No results found for: HGBA1C Lab Results  Component Value Date   WBC 5.9 02/08/2019   HGB 14.6 02/08/2019   HCT 44.3 02/08/2019   MCV 98 (H) 02/08/2019   PLT 249 02/08/2019   Lab Results  Component Value Date   ALT 17 03/22/2020   AST 20 03/22/2020   ALKPHOS 77 03/22/2020   BILITOT 0.7 03/22/2020     Review of Systems  Constitutional:  Negative for chills, fever and malaise/fatigue.  HENT:  Negative for drooling, ear discharge, ear pain and sore throat.   Eyes:  Negative for blurred vision.  Respiratory:  Negative for cough, shortness of breath and wheezing.   Cardiovascular:  Negative for chest pain, palpitations, orthopnea, leg swelling and PND.  Gastrointestinal:  Negative for abdominal pain, blood in stool, constipation, diarrhea and nausea.  Endocrine: Negative for polydipsia.  Genitourinary:  Negative for dysuria, frequency, hematuria and urgency.  Musculoskeletal:  Negative for back pain, myalgias and neck pain.  Skin:  Negative for rash.  Allergic/Immunologic: Negative for environmental allergies.  Neurological:  Negative for dizziness and headaches.  Hematological:  Does not bruise/bleed easily.  Psychiatric/Behavioral:  Negative for suicidal ideas. The patient is not nervous/anxious.    Patient Active Problem List   Diagnosis Date Noted   Family history of malignant neoplasm of gastrointestinal tract    Essential hypertension 05/02/2016   Mixed hyperlipidemia 05/02/2016   HSV-1 infection 05/02/2016    Allergies  Allergen Reactions   Codeine Nausea Only   Penicillins Rash   Sulfa Antibiotics Rash    Past Surgical History:  Procedure Laterality Date   APPENDECTOMY  COLONOSCOPY  08/2013   cleared for 5 yrs- Dr Servando Snare   COLONOSCOPY WITH PROPOFOL N/A 09/04/2018   Procedure: COLONOSCOPY WITH PROPOFOL;  Surgeon: Midge Minium, MD;  Location: [facility];  Service: Endoscopy;   Laterality: N/A;   JOINT REPLACEMENT  11/18/2017   REPLACEMENT TOTAL KNEE Left 09/17/2017   VAGINAL HYSTERECTOMY      Social History   Tobacco Use   Smoking status: Never   Smokeless tobacco: Never  Vaping Use   Vaping Use: Never used  Substance Use Topics   Alcohol use: Yes    Alcohol/week: 18.0 standard drinks    Types: 18 Cans of beer per week   Drug use: No     Medication list has been reviewed and updated.  Current Meds  Medication Sig   aspirin 81 MG tablet Take 81 mg by mouth daily.   Cholecalciferol (DIALYVITE VITAMIN D 5000 PO) Take 1 capsule by mouth once a week.   hydrochlorothiazide (HYDRODIURIL) 25 MG tablet TAKE 1 TABLET DAILY   L-Lysine 1000 MG TABS Take 2 tablets (2,000 mg total) by mouth daily. otc   losartan (COZAAR) 50 MG tablet Take 1 tablet (50 mg total) by mouth daily.   simvastatin (ZOCOR) 40 MG tablet Take 1 tablet (40 mg total) by mouth daily.   valACYclovir (VALTREX) 1000 MG tablet Take 1 tablet (1,000 mg total) by mouth 2 (two) times daily. As needed for breakout   vitamin B-12 (CYANOCOBALAMIN) 1000 MCG tablet Take 2,000 mcg by mouth daily.    PHQ 2/9 Scores 03/22/2020 10/20/2019 08/09/2019 08/10/2018  PHQ - 2 Score 0 0 0 0  PHQ- 9 Score 0 - 0 0    GAD 7 : Generalized Anxiety Score 03/22/2020 08/09/2019  Nervous, Anxious, on Edge 0 1  Control/stop worrying 0 0  Worry too much - different things 0 0  Trouble relaxing 0 0  Restless 0 0  Easily annoyed or irritable 0 0  Afraid - awful might happen 0 0  Total GAD 7 Score 0 1  Anxiety Difficulty - Not difficult at all    BP Readings from Last 3 Encounters:  08/10/20 140/80  03/22/20 138/90  02/09/20 124/80    Physical Exam Vitals and nursing note reviewed.  Constitutional:      General: She is not in acute distress.    Appearance: She is not diaphoretic.  HENT:     Head: Normocephalic and atraumatic.     Right Ear: Tympanic membrane, ear canal and external ear normal. There is no impacted  cerumen.     Left Ear: Tympanic membrane, ear canal and external ear normal. There is no impacted cerumen.     Nose: Nose normal. No congestion or rhinorrhea.  Eyes:     General:        Right eye: No discharge.        Left eye: No discharge.     Conjunctiva/sclera: Conjunctivae normal.     Pupils: Pupils are equal, round, and reactive to light.  Neck:     Thyroid: No thyromegaly.     Vascular: No carotid bruit or JVD.  Cardiovascular:     Rate and Rhythm: Normal rate and regular rhythm.     Heart sounds: Normal heart sounds. No murmur heard.   No friction rub. No gallop.  Pulmonary:     Effort: Pulmonary effort is normal.     Breath sounds: Normal breath sounds. No wheezing, rhonchi or rales.  Abdominal:  General: Bowel sounds are normal.     Palpations: Abdomen is soft. There is no mass.     Tenderness: There is no abdominal tenderness. There is no guarding or rebound.  Musculoskeletal:        General: Normal range of motion.     Cervical back: Normal range of motion and neck supple.  Lymphadenopathy:     Cervical: No cervical adenopathy.  Skin:    General: Skin is warm and dry.  Neurological:     Mental Status: She is alert.     Motor: No weakness.     Coordination: Coordination normal.     Deep Tendon Reflexes: Reflexes are normal and symmetric.    Wt Readings from Last 3 Encounters:  08/10/20 237 lb (107.5 kg)  03/22/20 237 lb (107.5 kg)  02/09/20 235 lb (106.6 kg)    BP 140/80   Pulse 80   Ht 5\' 8"  (1.727 m)   Wt 237 lb (107.5 kg)   BMI 36.04 kg/m   Assessment and Plan:  1. Essential hypertension .  Controlled.  Stable.  Blood pressure is 138/80.  Continue hydrochlorothiazide 25 mg once a day and losartan 50 mg once a day.  Will check renal function panel. - hydrochlorothiazide (HYDRODIURIL) 25 MG tablet; Take 1 tablet (25 mg total) by mouth daily.  Dispense: 90 tablet; Refill: 1 - losartan (COZAAR) 50 MG tablet; Take 1 tablet (50 mg total) by mouth  daily.  Dispense: 90 tablet; Refill: 1 - Renal Function Panel  2. Mixed hyperlipidemia .  Controlled.  Stable.  Continue simvastatin 40 mg once a day.  We will check lipid panel for current status of LDL. - simvastatin (ZOCOR) 40 MG tablet; Take 1 tablet (40 mg total) by mouth daily.  Dispense: 90 tablet; Refill: 1 - Lipid Panel With LDL/HDL Ratio  3. HSV-1 infection On it.  Controlled.  Stable.  Continue Valtrex 1 g 1 tablet twice a day as needed. - valACYclovir (VALTREX) 1000 MG tablet; Take 1 tablet (1,000 mg total) by mouth 2 (two) times daily. As needed for breakout  Dispense: 20 tablet; Refill: 1

## 2020-08-11 LAB — RENAL FUNCTION PANEL
Albumin: 4.5 g/dL (ref 3.8–4.8)
BUN/Creatinine Ratio: 21 (ref 12–28)
BUN: 16 mg/dL (ref 8–27)
CO2: 25 mmol/L (ref 20–29)
Calcium: 10.3 mg/dL (ref 8.7–10.3)
Chloride: 102 mmol/L (ref 96–106)
Creatinine, Ser: 0.76 mg/dL (ref 0.57–1.00)
Glucose: 99 mg/dL (ref 65–99)
Phosphorus: 4 mg/dL (ref 3.0–4.3)
Potassium: 4.7 mmol/L (ref 3.5–5.2)
Sodium: 145 mmol/L — ABNORMAL HIGH (ref 134–144)
eGFR: 86 mL/min/{1.73_m2} (ref >59)

## 2020-08-11 LAB — LIPID PANEL WITH LDL/HDL RATIO
Cholesterol, Total: 198 mg/dL (ref 100–199)
HDL: 69 mg/dL (ref >39)
LDL Chol Calc (NIH): 112 mg/dL — ABNORMAL HIGH (ref 0–99)
LDL/HDL Ratio: 1.6 ratio (ref 0.0–3.2)
Triglycerides: 94 mg/dL (ref 0–149)
VLDL Cholesterol Cal: 17 mg/dL (ref 5–40)

## 2020-10-18 ENCOUNTER — Encounter: Payer: Self-pay | Admitting: Family Medicine

## 2020-10-23 ENCOUNTER — Ambulatory Visit (INDEPENDENT_AMBULATORY_CARE_PROVIDER_SITE_OTHER): Payer: Medicare Other

## 2020-10-23 DIAGNOSIS — Z780 Asymptomatic menopausal state: Secondary | ICD-10-CM

## 2020-10-23 DIAGNOSIS — Z0000 Encounter for general adult medical examination without abnormal findings: Secondary | ICD-10-CM | POA: Diagnosis not present

## 2020-10-23 NOTE — Progress Notes (Signed)
Subjective:   Katrina Crawford is a 65 y.o. female who presents for Medicare Annual (Subsequent) preventive examination.  Virtual Visit via Telephone Note  I connected with  KESI PERROW on 10/23/20 at  2:40 PM EDT by telephone and verified that I am speaking with the correct person using two identifiers.  Location: Patient: home Provider: [facility] Persons participating in the virtual visit: patient/Nurse Health Advisor   I discussed the limitations, risks, security and privacy concerns of performing an evaluation and management service by telephone and the availability of in person appointments. The patient expressed understanding and agreed to proceed.  Interactive audio and video telecommunications were attempted between this nurse and patient, however failed, due to patient having technical difficulties OR patient did not have access to video capability.  We continued and completed visit with audio only.  Some vital signs may be absent or patient reported.   Reather Littler, LPN   Review of Systems     Cardiac Risk Factors include: advanced age (>61men, >66 women);dyslipidemia;hypertension;obesity (BMI >30kg/m2)     Objective:    There were no vitals filed for this visit. There is no height or weight on file to calculate BMI.  Advanced Directives 10/23/2020 10/20/2019 10/16/2019 09/04/2018  Does Patient Have a Medical Advance Directive? Yes Yes No Yes  Type of Estate agent of [place];Living will Healthcare Power of [place];Living will - Healthcare Power of [place];Living will  Does patient want to make changes to medical advance directive? - - - No - Patient declined  Copy of Healthcare Power of Attorney in Chart? Yes - validated most recent copy scanned in chart (See row information) No - copy requested - Yes - validated most recent copy scanned in chart (See row information)    Current Medications (verified) Outpatient Encounter Medications as of 10/23/2020   Medication Sig   aspirin 81 MG tablet Take 81 mg by mouth daily.   hydrochlorothiazide (HYDRODIURIL) 25 MG tablet Take 1 tablet (25 mg total) by mouth daily.   L-Lysine 1000 MG TABS Take 2 tablets (2,000 mg total) by mouth daily. otc   losartan (COZAAR) 50 MG tablet Take 1 tablet (50 mg total) by mouth daily.   mupirocin ointment (BACTROBAN) 2 % Apply 1 application topically 2 (two) times daily.   simvastatin (ZOCOR) 40 MG tablet Take 1 tablet (40 mg total) by mouth daily.   valACYclovir (VALTREX) 1000 MG tablet Take 1 tablet (1,000 mg total) by mouth 2 (two) times daily. As needed for breakout   vitamin B-12 (CYANOCOBALAMIN) 1000 MCG tablet Take 2,000 mcg by mouth daily.   [DISCONTINUED] Cholecalciferol (DIALYVITE VITAMIN D 5000 PO) Take 1 capsule by mouth once a week.   No facility-administered encounter medications on file as of 10/23/2020.    Allergies (verified) Codeine, Penicillins, and Sulfa antibiotics   History: Past Medical History:  Diagnosis Date   Allergy 01/22/1998   Arthritis    knees, lower back   Hyperlipidemia    Hypertension    Orthodontics    invisalign   Past Surgical History:  Procedure Laterality Date   APPENDECTOMY     COLONOSCOPY  08/2013   cleared for 5 yrs- Dr Servando Snare   COLONOSCOPY WITH PROPOFOL N/A 09/04/2018   Procedure: COLONOSCOPY WITH PROPOFOL;  Surgeon: Midge Minium, MD;  Location: [facility];  Service: Endoscopy;  Laterality: N/A;   JOINT REPLACEMENT  11/18/2017   REPLACEMENT TOTAL KNEE Left 09/17/2017   VAGINAL HYSTERECTOMY     Family  History  Problem Relation Age of Onset   Cancer Mother    Hypertension Father    Breast cancer Sister 59   ADD / ADHD Sister    Cancer Sister    Cancer Sister    Cancer Brother    Cancer Brother    Social History   Socioeconomic History   Marital status: Married    Spouse name: Not on file   Number of children: 0   Years of education: Not on file   Highest education level: Not on file   Occupational History   Not on file  Tobacco Use   Smoking status: Never   Smokeless tobacco: Never  Vaping Use   Vaping Use: Never used  Substance and Sexual Activity   Alcohol use: Yes    Alcohol/week: 18.0 standard drinks    Types: 18 Cans of beer per week   Drug use: No   Sexual activity: Yes    Birth control/protection: None  Other Topics Concern   Not on file  Social History Narrative   Not on file   Social Determinants of Health   Financial Resource Strain: Low Risk    Difficulty of Paying Living Expenses: Not hard at all  Food Insecurity: No Food Insecurity   Worried About Programme researcher, broadcasting/film/video in the Last Year: Never true   Ran Out of Food in the Last Year: Never true  Transportation Needs: No Transportation Needs   Lack of Transportation (Medical): No   Lack of Transportation (Non-Medical): No  Physical Activity: Sufficiently Active   Days of Exercise per Week: 6 days   Minutes of Exercise per Session: 60 min  Stress: No Stress Concern Present   Feeling of Stress : Not at all  Social Connections: Moderately Integrated   Frequency of Communication with Friends and Family: More than three times a week   Frequency of Social Gatherings with Friends and Family: More than three times a week   Attends Religious Services: More than 4 times per year   Active Member of [employer] or Organizations: No   Attends Engineer, structural: Never   Marital Status: Married    Tobacco Counseling Counseling given: Not Answered   Clinical Intake:  Pre-visit preparation completed: Yes  Pain : No/denies pain     Nutritional Risks: None Diabetes: No  How often do you need to have someone help you when you read instructions, pamphlets, or other written materials from your doctor or pharmacy?: 1 - Never    Interpreter Needed?: No  Information entered by :: Reather Littler LPN   Activities of Daily Living In your present state of health, do you have any difficulty  performing the following activities: 10/23/2020  Hearing? N  Vision? N  Difficulty concentrating or making decisions? N  Walking or climbing stairs? N  Dressing or bathing? N  Doing errands, shopping? N  Preparing Food and eating ? N  Using the Toilet? N  In the past six months, have you accidently leaked urine? N  Do you have problems with loss of bowel control? N  Managing your Medications? N  Managing your Finances? N  Housekeeping or managing your Housekeeping? N  Some recent data might be hidden    Patient Care Team: Duanne Limerick, MD as PCP - General (Family Medicine)  Indicate any recent Medical Services you may have received from other than [facility] providers in the past year (date may be approximate).     Assessment:  This is a routine wellness examination for Sydne.  Hearing/Vision screen Hearing Screening - Comments:: Pt denies hearing difficulty Vision Screening - Comments:: Annual vision screenings with Dr. Alvester Morin  Dietary issues and exercise activities discussed: Current Exercise Habits: Home exercise routine, Type of exercise: Other - see comments (elliptical, exercise bike, sauna), Time (Minutes): 60, Frequency (Times/Week): 6, Weekly Exercise (Minutes/Week): 360, Intensity: Moderate, Exercise limited by: None identified   Goals Addressed   None    Depression Screen PHQ 2/9 Scores 10/23/2020 03/22/2020 10/20/2019 08/09/2019 08/10/2018 07/22/2018 07/01/2017  PHQ - 2 Score 0 0 0 0 0 0 0  PHQ- 9 Score - 0 - 0 0 0 0    Fall Risk Fall Risk  10/23/2020 10/20/2019 08/09/2019 07/01/2017 03/24/2015  Falls in the past year? 0 1 0 No No  Number falls in past yr: 0 1 - - -  Injury with Fall? 0 1 - - -  Risk for fall due to : No Fall Risks Impaired mobility;History of fall(s) - - -  Follow up Falls prevention discussed Falls prevention discussed Falls evaluation completed - -    FALL RISK PREVENTION PERTAINING TO THE HOME:  Any stairs in or around the home? Yes  If so, are  there any without handrails? Yes  - steps in garage Home free of loose throw rugs in walkways, pet beds, electrical cords, etc? Yes  Adequate lighting in your home to reduce risk of falls? Yes   ASSISTIVE DEVICES UTILIZED TO PREVENT FALLS:  Life alert? No  Use of a cane, walker or w/c? No  Grab bars in the bathroom? Yes  Shower chair or bench in shower? Yes  Elevated toilet seat or a handicapped toilet? Yes   TIMED UP AND GO:  Was the test performed? No . Telephonic visit.   Cognitive Function: Normal cognitive status assessed by direct observation by this Nurse Health Advisor. No abnormalities found.          Immunizations Immunization History  Administered Date(s) Administered   Fluad Quad(high Dose 65+) 09/28/2018, 09/28/2019   Influenza Inj Mdck Quad Pf 09/29/2017   Influenza,inj,Quad PF,6+ Mos 10/06/2015, 09/24/2016   Influenza-Unspecified 09/23/2016, 09/28/2018, 09/29/2020   Moderna Sars-Covid-2 Vaccination 09/26/2020   PFIZER Comirnaty(Gray Top)Covid-19 Tri-Sucrose Vaccine 04/19/2020   PFIZER(Purple Top)SARS-COV-2 Vaccination 02/15/2019, 03/08/2019, 10/21/2019   Pneumococcal Conjugate-13 11/20/2018   Tdap 03/24/2015   Zoster Recombinat (Shingrix) 10/25/2016, 04/22/2018    TDAP status: Up to date  Flu Vaccine status: Up to date  Pneumococcal vaccine status: Due, Education has been provided regarding the importance of this vaccine. Advised may receive this vaccine at local pharmacy or Health Dept. Aware to provide a copy of the vaccination record if obtained from local pharmacy or Health Dept. Verbalized acceptance and understanding.  Covid-19 vaccine status: Completed vaccines  Qualifies for Shingles Vaccine? Yes   Zostavax completed No   Shingrix Completed?: Yes  Screening Tests Health Maintenance  Topic Date Due   DEXA SCAN  Never done   MAMMOGRAM  08/03/2022   COLONOSCOPY (Pts 45-64yrs Insurance coverage will need to be confirmed)  09/04/2023    TETANUS/TDAP  03/23/2025   INFLUENZA VACCINE  Completed   COVID-19 Vaccine  Completed   Hepatitis C Screening  Completed   Zoster Vaccines- Shingrix  Completed   HPV VACCINES  Aged Out    Health Maintenance  Health Maintenance Due  Topic Date Due   DEXA SCAN  Never done    Colorectal cancer screening: Type of screening: Colonoscopy.  Completed 09/04/18. Repeat every 5 years  Mammogram status: Completed 08/02/20. Repeat every year  Bone Density status: Ordered today. Pt provided with contact info and advised to call to schedule appt.  Lung Cancer Screening: (Low Dose CT Chest recommended if Age 47-80 years, 30 pack-year currently smoking OR have quit w/in 15years.) does not qualify.   Additional Screening:  Hepatitis C Screening: does qualify; Completed 05/02/16.   Vision Screening: Recommended annual ophthalmology exams for early detection of glaucoma and other disorders of the eye. Is the patient up to date with their annual eye exam?  Yes  Who is the provider or what is the name of the office in which the patient attends annual eye exams? Dr. Alvester Morin.   Dental Screening: Recommended annual dental exams for proper oral hygiene  Community Resource Referral / Chronic Care Management: CRR required this visit?  No   CCM required this visit?  No      Plan:     I have personally reviewed and noted the following in the patient's chart:   Medical and social history Use of alcohol, tobacco or illicit drugs  Current medications and supplements including opioid prescriptions.  Functional ability and status Nutritional status Physical activity Advanced directives List of other physicians Hospitalizations, surgeries, and ER visits in previous 12 months Vitals Screenings to include cognitive, depression, and falls Referrals and appointments  In addition, I have reviewed and discussed with patient certain preventive protocols, quality metrics, and best practice recommendations. A  written personalized care plan for preventive services as well as general preventive health recommendations were provided to patient.     Reather Littler, LPN   81/02/7515   Nurse Notes: none

## 2020-10-23 NOTE — Patient Instructions (Signed)
[name] , Thank you for taking time to come for your Medicare Wellness Visit. I appreciate your ongoing commitment to your health goals. Please review the following plan we discussed and let me know if I can assist you in the future.   Screening recommendations/referrals: Colonoscopy: done 09/04/18. Repeat 08/2023 Mammogram: done 08/02/20 Bone Density: Please call [phone] to schedule your bone density screening Recommended yearly ophthalmology/optometry visit for glaucoma screening and checkup Recommended yearly dental visit for hygiene and checkup  Vaccinations: Influenza vaccine: done 09/29/20 Pneumococcal vaccine: done 11/20/18; due for Pneumovax23 Tdap vaccine: done 03/24/15 Shingles vaccine: done 2018 & 2020   Covid-19:done 02/15/19, 03/08/19, 10/21/19, 04/19/20 & 09/26/20  Conditions/risks identified: Keep up the great work!  Next appointment: Follow up in one year for your annual wellness visit    Preventive Care 65 Years and Older, Female Preventive care refers to lifestyle choices and visits with your health care provider that can promote health and wellness. What does preventive care include? A yearly physical exam. This is also called an annual well check. Dental exams once or twice a year. Routine eye exams. Ask your health care provider how often you should have your eyes checked. Personal lifestyle choices, including: Daily care of your teeth and gums. Regular physical activity. Eating a healthy diet. Avoiding tobacco and drug use. Limiting alcohol use. Practicing safe sex. Taking low-dose aspirin every day. Taking vitamin and mineral supplements as recommended by your health care provider. What happens during an annual well check? The services and screenings done by your health care provider during your annual well check will depend on your age, overall health, lifestyle risk factors, and family history of disease. Counseling  Your health care provider may ask you  questions about your: Alcohol use. Tobacco use. Drug use. Emotional well-being. Home and relationship well-being. Sexual activity. Eating habits. History of falls. Memory and ability to understand (cognition). Work and work Astronomer. Reproductive health. Screening  You may have the following tests or measurements: Height, weight, and BMI. Blood pressure. Lipid and cholesterol levels. These may be checked every 5 years, or more frequently if you are over 26 years old. Skin check. Lung cancer screening. You may have this screening every year starting at age 34 if you have a 30-pack-year history of smoking and currently smoke or have quit within the past 15 years. Fecal occult blood test (FOBT) of the stool. You may have this test every year starting at age 4. Flexible sigmoidoscopy or colonoscopy. You may have a sigmoidoscopy every 5 years or a colonoscopy every 10 years starting at age 71. Hepatitis C blood test. Hepatitis B blood test. Sexually transmitted disease (STD) testing. Diabetes screening. This is done by checking your blood sugar (glucose) after you have not eaten for a while (fasting). You may have this done every 1-3 years. Bone density scan. This is done to screen for osteoporosis. You may have this done starting at age 75. Mammogram. This may be done every 1-2 years. Talk to your health care provider about how often you should have regular mammograms. Talk with your health care provider about your test results, treatment options, and if necessary, the need for more tests. Vaccines  Your health care provider may recommend certain vaccines, such as: Influenza vaccine. This is recommended every year. Tetanus, diphtheria, and acellular pertussis (Tdap, Td) vaccine. You may need a Td booster every 10 years. Zoster vaccine. You may need this after age 41. Pneumococcal 13-valent conjugate (PCV13) vaccine. One dose is  recommended after age 27. Pneumococcal polysaccharide  (PPSV23) vaccine. One dose is recommended after age 24. Talk to your health care provider about which screenings and vaccines you need and how often you need them. This information is not intended to replace advice given to you by your health care provider. Make sure you discuss any questions you have with your health care provider. Document Released: 02/03/2015 Document Revised: 09/27/2015 Document Reviewed: 11/08/2014 Elsevier Interactive Patient Education  2017 [employer] Prevention in the Home Falls can cause injuries. They can happen to people of all ages. There are many things you can do to make your home safe and to help prevent falls. What can I do on the outside of my home? Regularly fix the edges of walkways and driveways and fix any cracks. Remove anything that might make you trip as you walk through a door, such as a raised step or threshold. Trim any bushes or trees on the path to your home. Use bright outdoor lighting. Clear any walking paths of anything that might make someone trip, such as rocks or tools. Regularly check to see if handrails are loose or broken. Make sure that both sides of any steps have handrails. Any raised decks and porches should have guardrails on the edges. Have any leaves, snow, or ice cleared regularly. Use sand or salt on walking paths during winter. Clean up any spills in your garage right away. This includes oil or grease spills. What can I do in the bathroom? Use night lights. Install grab bars by the toilet and in the tub and shower. Do not use towel bars as grab bars. Use non-skid mats or decals in the tub or shower. If you need to sit down in the shower, use a plastic, non-slip stool. Keep the floor dry. Clean up any water that spills on the floor as soon as it happens. Remove soap buildup in the tub or shower regularly. Attach bath mats securely with double-sided non-slip rug tape. Do not have throw rugs and other things on the  floor that can make you trip. What can I do in the bedroom? Use night lights. Make sure that you have a light by your bed that is easy to reach. Do not use any sheets or blankets that are too big for your bed. They should not hang down onto the floor. Have a firm chair that has side arms. You can use this for support while you get dressed. Do not have throw rugs and other things on the floor that can make you trip. What can I do in the kitchen? Clean up any spills right away. Avoid walking on wet floors. Keep items that you use a lot in easy-to-reach places. If you need to reach something above you, use a strong step stool that has a grab bar. Keep electrical cords out of the way. Do not use floor polish or wax that makes floors slippery. If you must use wax, use non-skid floor wax. Do not have throw rugs and other things on the floor that can make you trip. What can I do with my stairs? Do not leave any items on the stairs. Make sure that there are handrails on both sides of the stairs and use them. Fix handrails that are broken or loose. Make sure that handrails are as long as the stairways. Check any carpeting to make sure that it is firmly attached to the stairs. Fix any carpet that is loose or worn. Avoid having  throw rugs at the top or bottom of the stairs. If you do have throw rugs, attach them to the floor with carpet tape. Make sure that you have a light switch at the top of the stairs and the bottom of the stairs. If you do not have them, ask someone to add them for you. What else can I do to help prevent falls? Wear shoes that: Do not have high heels. Have rubber bottoms. Are comfortable and fit you well. Are closed at the toe. Do not wear sandals. If you use a stepladder: Make sure that it is fully opened. Do not climb a closed stepladder. Make sure that both sides of the stepladder are locked into place. Ask someone to hold it for you, if possible. Clearly mark and make  sure that you can see: Any grab bars or handrails. First and last steps. Where the edge of each step is. Use tools that help you move around (mobility aids) if they are needed. These include: Canes. Walkers. Scooters. Crutches. Turn on the lights when you go into a dark area. Replace any light bulbs as soon as they burn out. Set up your furniture so you have a clear path. Avoid moving your furniture around. If any of your floors are uneven, fix them. If there are any pets around you, be aware of where they are. Review your medicines with your doctor. Some medicines can make you feel dizzy. This can increase your chance of falling. Ask your doctor what other things that you can do to help prevent falls. This information is not intended to replace advice given to you by your health care provider. Make sure you discuss any questions you have with your health care provider. Document Released: 11/03/2008 Document Revised: 06/15/2015 Document Reviewed: 02/11/2014 Elsevier Interactive Patient Education  2017 [employer].

## 2020-10-24 ENCOUNTER — Encounter: Payer: Self-pay | Admitting: Family Medicine

## 2020-11-23 ENCOUNTER — Other Ambulatory Visit: Payer: Self-pay

## 2020-11-23 ENCOUNTER — Ambulatory Visit
Admission: RE | Admit: 2020-11-23 | Discharge: 2020-11-23 | Disposition: A | Discharge status: Home / Self Care | Payer: Medicare Other | Source: Ambulatory Visit | Attending: Family Medicine | Admitting: Family Medicine

## 2020-11-23 DIAGNOSIS — Z780 Asymptomatic menopausal state: Secondary | ICD-10-CM | POA: Diagnosis present

## 2021-01-26 ENCOUNTER — Encounter: Payer: Self-pay | Admitting: Family Medicine

## 2021-01-26 ENCOUNTER — Ambulatory Visit (INDEPENDENT_AMBULATORY_CARE_PROVIDER_SITE_OTHER): Payer: Medicare Other | Admitting: Family Medicine

## 2021-01-26 ENCOUNTER — Other Ambulatory Visit: Payer: Self-pay

## 2021-01-26 VITALS — BP 138/80 | HR 60 | Ht 68.0 in | Wt 230.0 lb

## 2021-01-26 DIAGNOSIS — J0100 Acute maxillary sinusitis, unspecified: Secondary | ICD-10-CM | POA: Diagnosis not present

## 2021-01-26 DIAGNOSIS — Z23 Encounter for immunization: Secondary | ICD-10-CM

## 2021-01-26 MED ORDER — CEFUROXIME AXETIL 500 MG PO TABS: 500.0000 mg | ORAL_TABLET | Freq: Two times a day (BID) | ORAL | 0 refills | Status: AC

## 2021-01-26 NOTE — Progress Notes (Deleted)
Date:  01/26/2021   Name:  Katrina Crawford   DOB:  May 18, 1955   MRN:  [id number]   Chief Complaint: Sinusitis (L) sided facial pain, no fever, not really any cough.)  Sinusitis   Lab Results  Component Value Date   NA 145 (H) 08/10/2020   K 4.7 08/10/2020   CO2 25 08/10/2020   GLUCOSE 99 08/10/2020   BUN 16 08/10/2020   CREATININE 0.76 08/10/2020   CALCIUM 10.3 08/10/2020   EGFR 86 08/10/2020   GFRNONAA 85 08/09/2019   Lab Results  Component Value Date   CHOL 198 08/10/2020   HDL 69 08/10/2020   LDLCALC 112 (H) 08/10/2020   TRIG 94 08/10/2020   CHOLHDL 3.5 07/01/2017   No results found for: TSH No results found for: HGBA1C Lab Results  Component Value Date   WBC 5.9 02/08/2019   HGB 14.6 02/08/2019   HCT 44.3 02/08/2019   MCV 98 (H) 02/08/2019   PLT 249 02/08/2019   Lab Results  Component Value Date   ALT 17 03/22/2020   AST 20 03/22/2020   ALKPHOS 77 03/22/2020   BILITOT 0.7 03/22/2020   No results found for: 25OHVITD2, 25OHVITD3, VD25OH   Review of Systems  Patient Active Problem List   Diagnosis Date Noted   Family history of malignant neoplasm of gastrointestinal tract    Status post total knee replacement using cement, left 12/04/2017   Primary osteoarthritis of left knee 08/19/2017   Obesity (BMI 35.0-39.9 without comorbidity) 04/07/2017   Essential hypertension 05/02/2016   Mixed hyperlipidemia 05/02/2016   HSV-1 infection 05/02/2016    Allergies  Allergen Reactions   Codeine Nausea Only   Penicillins Rash   Sulfa Antibiotics Rash    Past Surgical History:  Procedure Laterality Date   APPENDECTOMY     COLONOSCOPY  08/2013   cleared for 5 yrs- Dr Allen Norris   COLONOSCOPY WITH PROPOFOL N/A 09/04/2018   Procedure: COLONOSCOPY WITH PROPOFOL;  Surgeon: Lucilla Lame, MD;  Location: [facility];  Service: Endoscopy;  Laterality: N/A;   JOINT REPLACEMENT  11/18/2017   REPLACEMENT TOTAL KNEE Left 09/17/2017   VAGINAL  HYSTERECTOMY      Social History   Tobacco Use   Smoking status: Never   Smokeless tobacco: Never  Vaping Use   Vaping Use: Never used  Substance Use Topics   Alcohol use: Yes    Alcohol/week: 18.0 standard drinks    Types: 18 Cans of beer per week   Drug use: No     Medication list has been reviewed and updated.  Current Meds  Medication Sig   aspirin 81 MG tablet Take 81 mg by mouth daily.   Calcium Carbonate (CALCIUM 600 PO) Take 1 tablet by mouth daily.   hydrochlorothiazide (HYDRODIURIL) 25 MG tablet Take 1 tablet (25 mg total) by mouth daily.   L-Lysine 1000 MG TABS Take 2 tablets (2,000 mg total) by mouth daily. otc   losartan (COZAAR) 50 MG tablet Take 1 tablet (50 mg total) by mouth daily.   mupirocin ointment (BACTROBAN) 2 % Apply 1 application topically 2 (two) times daily.   simvastatin (ZOCOR) 40 MG tablet Take 1 tablet (40 mg total) by mouth daily.   valACYclovir (VALTREX) 1000 MG tablet Take 1 tablet (1,000 mg total) by mouth 2 (two) times daily. As needed for breakout   vitamin B-12 (CYANOCOBALAMIN) 1000 MCG tablet Take 2,000 mcg by mouth daily.    PHQ 2/9 Scores 10/23/2020 03/22/2020 10/20/2019  08/09/2019  PHQ - 2 Score 0 0 0 0  PHQ- 9 Score - 0 - 0    GAD 7 : Generalized Anxiety Score 03/22/2020 08/09/2019  Nervous, Anxious, on Edge 0 1  Control/stop worrying 0 0  Worry too much - different things 0 0  Trouble relaxing 0 0  Restless 0 0  Easily annoyed or irritable 0 0  Afraid - awful might happen 0 0  Total GAD 7 Score 0 1  Anxiety Difficulty - Not difficult at all    BP Readings from Last 3 Encounters:  01/26/21 138/80  08/10/20 138/80  03/22/20 138/90    Physical Exam  Wt Readings from Last 3 Encounters:  01/26/21 230 lb (104.3 kg)  08/10/20 237 lb (107.5 kg)  03/22/20 237 lb (107.5 kg)    BP 138/80    Pulse 60    Ht _0  (1.727 m)    Wt 230 lb (104.3 kg)    BMI 34.97 kg/m   Assessment and Plan:

## 2021-01-26 NOTE — Progress Notes (Signed)
Date:  01/26/2021   Name:  Katrina Crawford   DOB:  03-27-1955   MRN:  [id number]   Chief Complaint: Sinusitis (L) sided facial pain, no fever, not really any cough.)  Sinusitis This is a new problem. The current episode started in the past 7 days. The problem has been gradually improving since onset. There has been no fever. The pain is moderate. Associated symptoms include congestion, ear pain and sinus pressure. Pertinent negatives include no chills, coughing, headaches, neck pain, shortness of breath or sore throat. Treatments tried: Theraflu. The treatment provided moderate relief.   Lab Results  Component Value Date   NA 145 (H) 08/10/2020   K 4.7 08/10/2020   CO2 25 08/10/2020   GLUCOSE 99 08/10/2020   BUN 16 08/10/2020   CREATININE 0.76 08/10/2020   CALCIUM 10.3 08/10/2020   EGFR 86 08/10/2020   GFRNONAA 85 08/09/2019   Lab Results  Component Value Date   CHOL 198 08/10/2020   HDL 69 08/10/2020   LDLCALC 112 (H) 08/10/2020   TRIG 94 08/10/2020   CHOLHDL 3.5 07/01/2017   No results found for: TSH No results found for: HGBA1C Lab Results  Component Value Date   WBC 5.9 02/08/2019   HGB 14.6 02/08/2019   HCT 44.3 02/08/2019   MCV 98 (H) 02/08/2019   PLT 249 02/08/2019   Lab Results  Component Value Date   ALT 17 03/22/2020   AST 20 03/22/2020   ALKPHOS 77 03/22/2020   BILITOT 0.7 03/22/2020   No results found for: 25OHVITD2, 25OHVITD3, VD25OH   Review of Systems  Constitutional:  Negative for chills and fever.  HENT:  Positive for congestion, ear pain and sinus pressure. Negative for drooling, ear discharge and sore throat.   Respiratory:  Negative for cough, shortness of breath and wheezing.   Cardiovascular:  Negative for chest pain, palpitations and leg swelling.  Gastrointestinal:  Negative for abdominal pain, blood in stool, constipation, diarrhea and nausea.  Endocrine: Negative for polydipsia.  Genitourinary:  Negative for dysuria, frequency,  hematuria and urgency.  Musculoskeletal:  Negative for back pain, myalgias and neck pain.  Skin:  Negative for rash.  Allergic/Immunologic: Negative for environmental allergies.  Neurological:  Negative for dizziness and headaches.  Hematological:  Does not bruise/bleed easily.  Psychiatric/Behavioral:  Negative for suicidal ideas. The patient is not nervous/anxious.    Patient Active Problem List   Diagnosis Date Noted   Family history of malignant neoplasm of gastrointestinal tract    Status post total knee replacement using cement, left 12/04/2017   Primary osteoarthritis of left knee 08/19/2017   Obesity (BMI 35.0-39.9 without comorbidity) 04/07/2017   Essential hypertension 05/02/2016   Mixed hyperlipidemia 05/02/2016   HSV-1 infection 05/02/2016    Allergies  Allergen Reactions   Codeine Nausea Only   Penicillins Rash   Sulfa Antibiotics Rash    Past Surgical History:  Procedure Laterality Date   APPENDECTOMY     COLONOSCOPY  08/2013   cleared for 5 yrs- Dr Allen Norris   COLONOSCOPY WITH PROPOFOL N/A 09/04/2018   Procedure: COLONOSCOPY WITH PROPOFOL;  Surgeon: Lucilla Lame, MD;  Location: [facility];  Service: Endoscopy;  Laterality: N/A;   JOINT REPLACEMENT  11/18/2017   REPLACEMENT TOTAL KNEE Left 09/17/2017   VAGINAL HYSTERECTOMY      Social History   Tobacco Use   Smoking status: Never   Smokeless tobacco: Never  Vaping Use   Vaping Use: Never used  Substance Use  Topics   Alcohol use: Yes    Alcohol/week: 18.0 standard drinks    Types: 18 Cans of beer per week   Drug use: No     Medication list has been reviewed and updated.  Current Meds  Medication Sig   aspirin 81 MG tablet Take 81 mg by mouth daily.   Calcium Carbonate (CALCIUM 600 PO) Take 1 tablet by mouth daily.   hydrochlorothiazide (HYDRODIURIL) 25 MG tablet Take 1 tablet (25 mg total) by mouth daily.   L-Lysine 1000 MG TABS Take 2 tablets (2,000 mg total) by mouth daily. otc    losartan (COZAAR) 50 MG tablet Take 1 tablet (50 mg total) by mouth daily.   mupirocin ointment (BACTROBAN) 2 % Apply 1 application topically 2 (two) times daily.   simvastatin (ZOCOR) 40 MG tablet Take 1 tablet (40 mg total) by mouth daily.   valACYclovir (VALTREX) 1000 MG tablet Take 1 tablet (1,000 mg total) by mouth 2 (two) times daily. As needed for breakout   vitamin B-12 (CYANOCOBALAMIN) 1000 MCG tablet Take 2,000 mcg by mouth daily.    PHQ 2/9 Scores 10/23/2020 03/22/2020 10/20/2019 08/09/2019  PHQ - 2 Score 0 0 0 0  PHQ- 9 Score - 0 - 0    GAD 7 : Generalized Anxiety Score 03/22/2020 08/09/2019  Nervous, Anxious, on Edge 0 1  Control/stop worrying 0 0  Worry too much - different things 0 0  Trouble relaxing 0 0  Restless 0 0  Easily annoyed or irritable 0 0  Afraid - awful might happen 0 0  Total GAD 7 Score 0 1  Anxiety Difficulty - Not difficult at all    BP Readings from Last 3 Encounters:  01/26/21 138/80  08/10/20 138/80  03/22/20 138/90    Physical Exam Vitals and nursing note reviewed.  Constitutional:      Appearance: She is well-developed.  HENT:     Head: Normocephalic.     Right Ear: Tympanic membrane, ear canal and external ear normal.     Left Ear: Tympanic membrane, ear canal and external ear normal.     Nose: Nose normal. No congestion or rhinorrhea.     Mouth/Throat:     Mouth: Mucous membranes are moist.  Eyes:     General: Lids are everted, no foreign bodies appreciated. No scleral icterus.       Left eye: No foreign body or hordeolum.     Conjunctiva/sclera: Conjunctivae normal.     Right eye: Right conjunctiva is not injected.     Left eye: Left conjunctiva is not injected.     Pupils: Pupils are equal, round, and reactive to light.  Neck:     Thyroid: No thyromegaly.     Vascular: No JVD.     Trachea: No tracheal deviation.  Cardiovascular:     Rate and Rhythm: Normal rate and regular rhythm.     Heart sounds: Normal heart sounds. No murmur  heard.   No friction rub. No gallop.  Pulmonary:     Effort: Pulmonary effort is normal. No respiratory distress.     Breath sounds: Normal breath sounds. No wheezing, rhonchi or rales.  Abdominal:     General: Bowel sounds are normal.     Palpations: Abdomen is soft. There is no mass.     Tenderness: There is no abdominal tenderness. There is no guarding or rebound.  Musculoskeletal:        General: No tenderness. Normal range of motion.  Cervical back: Normal range of motion and neck supple.  Lymphadenopathy:     Cervical: No cervical adenopathy.  Skin:    General: Skin is warm.     Findings: No rash.  Neurological:     Mental Status: She is alert and oriented to person, place, and time.     Cranial Nerves: No cranial nerve deficit.     Deep Tendon Reflexes: Reflexes normal.  Psychiatric:        Mood and Affect: Mood is not anxious or depressed.    Wt Readings from Last 3 Encounters:  01/26/21 230 lb (104.3 kg)  08/10/20 237 lb (107.5 kg)  03/22/20 237 lb (107.5 kg)    BP 138/80    Pulse 60    Ht _0  (1.727 m)    Wt 230 lb (104.3 kg)    BMI 34.97 kg/m   Assessment and Plan:  1. Acute maxillary sinusitis, recurrence not specified Acute.  Persistent.  Not improving.  History and exam is consistent with a maxillary sinusitis.  We will treat with Ceftin 500 mg twice a day - cefUROXime (CEFTIN) 500 MG tablet; Take 1 tablet (500 mg total) by mouth 2 (two) times daily with a meal for 10 days.  Dispense: 20 tablet; Refill: 0  2. Need for pneumococcal vaccination Discussed and administered - Pneumococcal conjugate vaccine 20-valent (Prevnar 20)

## 2021-01-29 ENCOUNTER — Ambulatory Visit: Payer: BLUE CROSS/BLUE SHIELD | Admitting: Family Medicine

## 2021-02-12 ENCOUNTER — Ambulatory Visit: Payer: Self-pay | Admitting: Family Medicine

## 2021-02-13 ENCOUNTER — Ambulatory Visit: Payer: Medicare Other | Admitting: Family Medicine

## 2021-02-14 ENCOUNTER — Encounter: Payer: Self-pay | Admitting: Family Medicine

## 2021-02-14 ENCOUNTER — Other Ambulatory Visit: Payer: Self-pay

## 2021-02-14 ENCOUNTER — Ambulatory Visit: Payer: Medicare Other | Admitting: Family Medicine

## 2021-02-14 VITALS — BP 130/60 | HR 80 | Ht 68.0 in | Wt 229.0 lb

## 2021-02-14 DIAGNOSIS — I10 Essential (primary) hypertension: Secondary | ICD-10-CM

## 2021-02-14 DIAGNOSIS — E782 Mixed hyperlipidemia: Secondary | ICD-10-CM | POA: Diagnosis not present

## 2021-02-14 MED ORDER — LOSARTAN POTASSIUM 50 MG PO TABS: 50.0000 mg | ORAL_TABLET | Freq: Every day | ORAL | 1 refills | Status: DC

## 2021-02-14 MED ORDER — HYDROCHLOROTHIAZIDE 25 MG PO TABS: 25.0000 mg | ORAL_TABLET | Freq: Every day | ORAL | 1 refills | Status: DC

## 2021-02-14 MED ORDER — SIMVASTATIN 40 MG PO TABS: 40.0000 mg | ORAL_TABLET | Freq: Every day | ORAL | 1 refills | Status: DC

## 2021-02-14 NOTE — Progress Notes (Signed)
Date:  02/14/2021   Name:  Katrina Crawford   DOB:  1955/05/13   MRN:  [id number]   Chief Complaint: Hyperlipidemia and Hypertension  Hyperlipidemia This is a chronic problem. The current episode started more than 1 year ago. The problem is controlled. Recent lipid tests were reviewed and are normal. She has no history of chronic renal disease, diabetes, hypothyroidism, liver disease, obesity or nephrotic syndrome. There are no known factors aggravating her hyperlipidemia. Pertinent negatives include no chest pain, focal sensory loss, focal weakness, leg pain, myalgias or shortness of breath. Current antihyperlipidemic treatment includes statins. The current treatment provides moderate improvement of lipids. There are no compliance problems.  Risk factors for coronary artery disease include diabetes mellitus, dyslipidemia and hypertension.  Hypertension This is a chronic problem. The current episode started more than 1 year ago. The problem has been gradually improving since onset. The problem is controlled. Pertinent negatives include no anxiety, blurred vision, chest pain, headaches, malaise/fatigue, neck pain, orthopnea, palpitations, peripheral edema, PND, shortness of breath or sweats. There are no associated agents to hypertension. Risk factors for coronary artery disease include dyslipidemia. Past treatments include angiotensin blockers and diuretics. The current treatment provides moderate improvement. There are no compliance problems.  There is no history of angina, kidney disease, CAD/MI, CVA, heart failure, left ventricular hypertrophy, PVD or retinopathy. There is no history of chronic renal disease, a hypertension causing med or renovascular disease.   Lab Results  Component Value Date   NA 145 (H) 08/10/2020   K 4.7 08/10/2020   CO2 25 08/10/2020   GLUCOSE 99 08/10/2020   BUN 16 08/10/2020   CREATININE 0.76 08/10/2020   CALCIUM 10.3 08/10/2020   EGFR 86 08/10/2020   GFRNONAA 85  08/09/2019   Lab Results  Component Value Date   CHOL 198 08/10/2020   HDL 69 08/10/2020   LDLCALC 112 (H) 08/10/2020   TRIG 94 08/10/2020   CHOLHDL 3.5 07/01/2017   No results found for: TSH No results found for: HGBA1C Lab Results  Component Value Date   WBC 5.9 02/08/2019   HGB 14.6 02/08/2019   HCT 44.3 02/08/2019   MCV 98 (H) 02/08/2019   PLT 249 02/08/2019   Lab Results  Component Value Date   ALT 17 03/22/2020   AST 20 03/22/2020   ALKPHOS 77 03/22/2020   BILITOT 0.7 03/22/2020   No results found for: 25OHVITD2, 25OHVITD3, VD25OH   Review of Systems  Constitutional:  Negative for chills, fever and malaise/fatigue.  HENT:  Negative for drooling, ear discharge, ear pain and sore throat.   Eyes:  Negative for blurred vision.  Respiratory:  Negative for cough, shortness of breath and wheezing.   Cardiovascular:  Negative for chest pain, palpitations, orthopnea, leg swelling and PND.  Gastrointestinal:  Negative for abdominal pain, blood in stool, constipation, diarrhea and nausea.  Endocrine: Negative for polydipsia.  Genitourinary:  Negative for dysuria, frequency, hematuria and urgency.  Musculoskeletal:  Negative for back pain, myalgias and neck pain.  Skin:  Negative for rash.  Allergic/Immunologic: Negative for environmental allergies.  Neurological:  Negative for dizziness, focal weakness and headaches.  Hematological:  Does not bruise/bleed easily.  Psychiatric/Behavioral:  Negative for suicidal ideas. The patient is not nervous/anxious.    Patient Active Problem List   Diagnosis Date Noted   Family history of malignant neoplasm of gastrointestinal tract    Status post total knee replacement using cement, left 12/04/2017   Primary osteoarthritis of left knee  08/19/2017   Obesity (BMI 35.0-39.9 without comorbidity) 04/07/2017   Essential hypertension 05/02/2016   Mixed hyperlipidemia 05/02/2016   HSV-1 infection 05/02/2016    Allergies  Allergen  Reactions   Codeine Nausea Only   Penicillins Rash   Sulfa Antibiotics Rash    Past Surgical History:  Procedure Laterality Date   APPENDECTOMY     COLONOSCOPY  08/2013   cleared for 5 yrs- Dr Servando Snare   COLONOSCOPY WITH PROPOFOL N/A 09/04/2018   Procedure: COLONOSCOPY WITH PROPOFOL;  Surgeon: Midge Minium, MD;  Location: [facility];  Service: Endoscopy;  Laterality: N/A;   JOINT REPLACEMENT  11/18/2017   REPLACEMENT TOTAL KNEE Left 09/17/2017   VAGINAL HYSTERECTOMY      Social History   Tobacco Use   Smoking status: Never   Smokeless tobacco: Never  Vaping Use   Vaping Use: Never used  Substance Use Topics   Alcohol use: Yes    Alcohol/week: 18.0 standard drinks    Types: 18 Cans of beer per week   Drug use: No     Medication list has been reviewed and updated.  Current Meds  Medication Sig   aspirin 81 MG tablet Take 81 mg by mouth daily.   Calcium Carbonate (CALCIUM 600 PO) Take 1 tablet by mouth daily.   hydrochlorothiazide (HYDRODIURIL) 25 MG tablet Take 1 tablet (25 mg total) by mouth daily.   L-Lysine 1000 MG TABS Take 2 tablets (2,000 mg total) by mouth daily. otc   losartan (COZAAR) 50 MG tablet Take 1 tablet (50 mg total) by mouth daily.   mupirocin ointment (BACTROBAN) 2 % Apply 1 application topically 2 (two) times daily.   simvastatin (ZOCOR) 40 MG tablet Take 1 tablet (40 mg total) by mouth daily.   valACYclovir (VALTREX) 1000 MG tablet Take 1 tablet (1,000 mg total) by mouth 2 (two) times daily. As needed for breakout   vitamin B-12 (CYANOCOBALAMIN) 1000 MCG tablet Take 2,000 mcg by mouth daily.    PHQ 2/9 Scores 10/23/2020 03/22/2020 10/20/2019 08/09/2019  PHQ - 2 Score 0 0 0 0  PHQ- 9 Score - 0 - 0    GAD 7 : Generalized Anxiety Score 03/22/2020 08/09/2019  Nervous, Anxious, on Edge 0 1  Control/stop worrying 0 0  Worry too much - different things 0 0  Trouble relaxing 0 0  Restless 0 0  Easily annoyed or irritable 0 0  Afraid - awful might  happen 0 0  Total GAD 7 Score 0 1  Anxiety Difficulty - Not difficult at all    BP Readings from Last 3 Encounters:  02/14/21 130/60  01/26/21 138/80  08/10/20 138/80    Physical Exam Vitals and nursing note reviewed.  Constitutional:      Appearance: She is well-developed.  HENT:     Head: Normocephalic.     Right Ear: Tympanic membrane, ear canal and external ear normal.     Left Ear: Tympanic membrane, ear canal and external ear normal.     Nose: Nose normal. No congestion or rhinorrhea.     Mouth/Throat:     Mouth: Mucous membranes are moist.     Pharynx: No oropharyngeal exudate or posterior oropharyngeal erythema.  Eyes:     General: Lids are everted, no foreign bodies appreciated. No scleral icterus.       Left eye: No foreign body or hordeolum.     Conjunctiva/sclera: Conjunctivae normal.     Right eye: Right conjunctiva is not injected.  Left eye: Left conjunctiva is not injected.     Pupils: Pupils are equal, round, and reactive to light.  Neck:     Thyroid: No thyromegaly.     Vascular: No JVD.     Trachea: No tracheal deviation.  Cardiovascular:     Rate and Rhythm: Normal rate and regular rhythm.     Heart sounds: Normal heart sounds. No murmur heard.   No friction rub. No gallop.  Pulmonary:     Effort: Pulmonary effort is normal. No respiratory distress.     Breath sounds: Normal breath sounds. No wheezing, rhonchi or rales.  Chest:     Comments: Tender 9th/10th and intercostal Abdominal:     General: Bowel sounds are normal.     Palpations: Abdomen is soft. There is no hepatomegaly, splenomegaly or mass.     Tenderness: There is no abdominal tenderness. There is no guarding or rebound.  Musculoskeletal:        General: No tenderness. Normal range of motion.     Cervical back: Normal range of motion and neck supple.  Lymphadenopathy:     Cervical: No cervical adenopathy.  Skin:    General: Skin is warm.     Findings: No rash.  Neurological:      Mental Status: She is alert and oriented to person, place, and time.     Cranial Nerves: No cranial nerve deficit.     Gait: Gait normal.     Deep Tendon Reflexes: Reflexes normal.  Psychiatric:        Mood and Affect: Mood is not anxious or depressed.    Wt Readings from Last 3 Encounters:  02/14/21 229 lb (103.9 kg)  01/26/21 230 lb (104.3 kg)  08/10/20 237 lb (107.5 kg)    BP 130/60    Pulse 80    Ht $R'5\' 8"'Es$  (1.727 m)    Wt 229 lb (103.9 kg)    BMI 34.82 kg/m   Assessment and Plan:  1. Essential hypertension Chronic.  Controlled.  Stable.  Blood pressure is 130/60.  Continue hydrochlorothiazide 25 mg once a day and losartan 50 mg once a day.  Will check CMP for electrolytes and GFR. - hydrochlorothiazide (HYDRODIURIL) 25 MG tablet; Take 1 tablet (25 mg total) by mouth daily.  Dispense: 90 tablet; Refill: 1 - losartan (COZAAR) 50 MG tablet; Take 1 tablet (50 mg total) by mouth daily.  Dispense: 90 tablet; Refill: 1 - Comprehensive Metabolic Panel (CMET)  2. Mixed hyperlipidemia Chronic.  Controlled.  Stable.  Continue simvastatin 40 mg once a day.  Will check lipid panel for current status of LDL. - simvastatin (ZOCOR) 40 MG tablet; Take 1 tablet (40 mg total) by mouth daily.  Dispense: 90 tablet; Refill: 1 - Lipid Panel With LDL/HDL Ratio

## 2021-02-15 LAB — COMPREHENSIVE METABOLIC PANEL
ALT: 15 IU/L (ref 0–32)
AST: 17 IU/L (ref 0–40)
Albumin/Globulin Ratio: 1.9 (ref 1.2–2.2)
Albumin: 4.9 g/dL — ABNORMAL HIGH (ref 3.8–4.8)
Alkaline Phosphatase: 66 IU/L (ref 44–121)
BUN/Creatinine Ratio: 18 (ref 12–28)
BUN: 13 mg/dL (ref 8–27)
Bilirubin Total: 0.8 mg/dL (ref 0.0–1.2)
CO2: 26 mmol/L (ref 20–29)
Calcium: 10.1 mg/dL (ref 8.7–10.3)
Chloride: 98 mmol/L (ref 96–106)
Creatinine, Ser: 0.74 mg/dL (ref 0.57–1.00)
Globulin, Total: 2.6 g/dL (ref 1.5–4.5)
Glucose: 103 mg/dL — ABNORMAL HIGH (ref 70–99)
Potassium: 4.1 mmol/L (ref 3.5–5.2)
Sodium: 139 mmol/L (ref 134–144)
Total Protein: 7.5 g/dL (ref 6.0–8.5)
eGFR: 89 mL/min/{1.73_m2} (ref >59)

## 2021-02-15 LAB — LIPID PANEL WITH LDL/HDL RATIO
Cholesterol, Total: 198 mg/dL (ref 100–199)
HDL: 68 mg/dL (ref >39)
LDL Chol Calc (NIH): 113 mg/dL — ABNORMAL HIGH (ref 0–99)
LDL/HDL Ratio: 1.7 ratio (ref 0.0–3.2)
Triglycerides: 98 mg/dL (ref 0–149)
VLDL Cholesterol Cal: 17 mg/dL (ref 5–40)

## 2021-02-26 ENCOUNTER — Encounter: Payer: Self-pay | Admitting: Family Medicine

## 2021-05-06 ENCOUNTER — Other Ambulatory Visit: Payer: Self-pay | Admitting: Family Medicine

## 2021-05-06 DIAGNOSIS — E782 Mixed hyperlipidemia: Secondary | ICD-10-CM

## 2021-05-06 DIAGNOSIS — I10 Essential (primary) hypertension: Secondary | ICD-10-CM

## 2021-05-07 NOTE — Telephone Encounter (Signed)
Requested medications are due for refill today.  no ? ?Requested medications are on the active medications list.  yes ? ?Last refill. All 3 02/14/2021  - all 3 were filled with a 6 months supply ? ?Future visit scheduled.   yes ? ?Notes to clinic.  Pharmacy is requesting a 1 year supply. Pt should have 90 day supply remaining. ? ? ? ?Requested Prescriptions  ?Pending Prescriptions Disp Refills  ? simvastatin (ZOCOR) 40 MG tablet [Pharmacy Med Name: Simvastatin 40 MG Oral Tablet] 90 tablet 3  ?  Sig: TAKE 1 TABLET BY MOUTH DAILY  ?  ? Cardiovascular:  Antilipid - Statins Failed - 05/06/2021  4:35 PM  ?  ?  Failed - Lipid Panel in normal range within the last 12 months  ?  Cholesterol, Total  ?Date Value Ref Range Status  ?02/14/2021 198 100 - 199 mg/dL Final  ? ?LDL Chol Calc (NIH)  ?Date Value Ref Range Status  ?02/14/2021 113 (H) 0 - 99 mg/dL Final  ? ?HDL  ?Date Value Ref Range Status  ?02/14/2021 68 >39 mg/dL Final  ? ?Triglycerides  ?Date Value Ref Range Status  ?02/14/2021 98 0 - 149 mg/dL Final  ? ?  ?  ?  Passed - Patient is not pregnant  ?  ?  Passed - Valid encounter within last 12 months  ?  Recent Outpatient Visits   ? ?      ? 2 months ago Essential hypertension  ? [facility] Duanne Limerick, MD  ? 3 months ago Acute maxillary sinusitis, recurrence not specified  ? [facility] Duanne Limerick, MD  ? 9 months ago Essential hypertension  ? [facility] Duanne Limerick, MD  ? 1 year ago Essential hypertension  ? [facility] Duanne Limerick, MD  ? 1 year ago Essential hypertension  ? [facility] Duanne Limerick, MD  ? ?  ?  ?Future Appointments   ? ?        ? In 3 months Duanne Limerick, MD [facility]  ? ?  ? ? ?  ?  ?  ? hydrochlorothiazide (HYDRODIURIL) 25 MG tablet [Pharmacy Med Name: hydroCHLOROthiazide 25 MG Oral Tablet] 90 tablet 3  ?  Sig: TAKE 1 TABLET BY MOUTH DAILY  ?  ? Cardiovascular: Diuretics - Thiazide Passed - 05/06/2021  4:35 PM   ?  ?  Passed - Cr in normal range and within 180 days  ?  Creatinine, Ser  ?Date Value Ref Range Status  ?02/14/2021 0.74 0.57 - 1.00 mg/dL Final  ?  ?  ?  ?  Passed - K in normal range and within 180 days  ?  Potassium  ?Date Value Ref Range Status  ?02/14/2021 4.1 3.5 - 5.2 mmol/L Final  ?  ?  ?  ?  Passed - Na in normal range and within 180 days  ?  Sodium  ?Date Value Ref Range Status  ?02/14/2021 139 134 - 144 mmol/L Final  ?  ?  ?  ?  Passed - Last BP in normal range  ?  BP Readings from Last 1 Encounters:  ?02/14/21 130/60  ?  ?  ?  ?  Passed - Valid encounter within last 6 months  ?  Recent Outpatient Visits   ? ?      ? 2 months ago Essential hypertension  ? [facility] Duanne Limerick, MD  ?  3 months ago Acute maxillary sinusitis, recurrence not specified  ? [facility] Duanne Limerick, MD  ? 9 months ago Essential hypertension  ? [facility] Duanne Limerick, MD  ? 1 year ago Essential hypertension  ? [facility] Duanne Limerick, MD  ? 1 year ago Essential hypertension  ? [facility] Duanne Limerick, MD  ? ?  ?  ?Future Appointments   ? ?        ? In 3 months Duanne Limerick, MD [facility]  ? ?  ? ? ?  ?  ?  ? losartan (COZAAR) 50 MG tablet [Pharmacy Med Name: Losartan Potassium 50 MG Oral Tablet] 90 tablet 3  ?  Sig: TAKE 1 TABLET BY MOUTH DAILY  ?  ? Cardiovascular:  Angiotensin Receptor Blockers Passed - 05/06/2021  4:35 PM  ?  ?  Passed - Cr in normal range and within 180 days  ?  Creatinine, Ser  ?Date Value Ref Range Status  ?02/14/2021 0.74 0.57 - 1.00 mg/dL Final  ?  ?  ?  ?  Passed - K in normal range and within 180 days  ?  Potassium  ?Date Value Ref Range Status  ?02/14/2021 4.1 3.5 - 5.2 mmol/L Final  ?  ?  ?  ?  Passed - Patient is not pregnant  ?  ?  Passed - Last BP in normal range  ?  BP Readings from Last 1 Encounters:  ?02/14/21 130/60  ?  ?  ?  ?  Passed - Valid encounter within last 6 months  ?  Recent Outpatient Visits    ? ?      ? 2 months ago Essential hypertension  ? [facility] Duanne Limerick, MD  ? 3 months ago Acute maxillary sinusitis, recurrence not specified  ? [facility] Duanne Limerick, MD  ? 9 months ago Essential hypertension  ? [facility] Duanne Limerick, MD  ? 1 year ago Essential hypertension  ? [facility] Duanne Limerick, MD  ? 1 year ago Essential hypertension  ? [facility] Duanne Limerick, MD  ? ?  ?  ?Future Appointments   ? ?        ? In 3 months Duanne Limerick, MD [facility]  ? ?  ? ? ?  ?  ?  ?  ?

## 2021-05-22 ENCOUNTER — Ambulatory Visit: Payer: Medicare Other | Admitting: Family Medicine

## 2021-05-22 ENCOUNTER — Encounter: Payer: Self-pay | Admitting: Family Medicine

## 2021-05-22 VITALS — BP 126/72 | HR 88 | Ht 68.0 in | Wt 228.0 lb

## 2021-05-22 DIAGNOSIS — H6983 Other specified disorders of Eustachian tube, bilateral: Secondary | ICD-10-CM

## 2021-05-22 DIAGNOSIS — M26629 Arthralgia of temporomandibular joint, unspecified side: Secondary | ICD-10-CM

## 2021-05-22 DIAGNOSIS — J3501 Chronic tonsillitis: Secondary | ICD-10-CM | POA: Diagnosis not present

## 2021-05-22 DIAGNOSIS — H6993 Unspecified Eustachian tube disorder, bilateral: Secondary | ICD-10-CM

## 2021-05-22 MED ORDER — DOXYCYCLINE HYCLATE 100 MG PO TABS: 100.0000 mg | ORAL_TABLET | Freq: Two times a day (BID) | ORAL | 2 refills | Status: DC

## 2021-05-22 MED ORDER — MELOXICAM 7.5 MG PO TABS: 7.5000 mg | ORAL_TABLET | Freq: Every day | ORAL | 0 refills | Status: DC

## 2021-05-22 NOTE — Progress Notes (Signed)
? ? ?Date:  05/22/2021  ? ?Name:  Katrina Crawford   DOB:  [birth year]   MRN:  [id number] ? ? ?Chief Complaint: Ear Pain (Painful in front of R) ear and hurting in the back of mouth. Sore throat on R) side) ? ?Sore Throat  ?This is a chronic problem. The current episode started more than 1 month ago. The problem has been waxing and waning. The pain is moderate. Associated symptoms include ear pain. Pertinent negatives include no abdominal pain, congestion, coughing, diarrhea, drooling, ear discharge, headaches, hoarse voice, neck pain, shortness of breath, swollen glands or vomiting. She has had no exposure to strep or mono. She has tried nothing for the symptoms.  ?Otalgia  ?There is pain in the right ear. The current episode started more than 1 year ago. Episode frequency: worse last 4 days. The problem has been waxing and waning. There has been no fever. The pain is mild. Pertinent negatives include no abdominal pain, coughing, diarrhea, ear discharge, headaches, hearing loss, neck pain, rash, rhinorrhea, sore throat or vomiting. The treatment provided moderate relief.  ? ?Lab Results  ?Component Value Date  ? NA 139 02/14/2021  ? K 4.1 02/14/2021  ? CO2 26 02/14/2021  ? GLUCOSE 103 (H) 02/14/2021  ? BUN 13 02/14/2021  ? CREATININE 0.74 02/14/2021  ? CALCIUM 10.1 02/14/2021  ? EGFR 89 02/14/2021  ? GFRNONAA 85 08/09/2019  ? ?Lab Results  ?Component Value Date  ? CHOL 198 02/14/2021  ? HDL 68 02/14/2021  ? LDLCALC 113 (H) 02/14/2021  ? TRIG 98 02/14/2021  ? CHOLHDL 3.5 07/01/2017  ? ?No results found for: TSH ?No results found for: HGBA1C ?Lab Results  ?Component Value Date  ? WBC 5.9 02/08/2019  ? HGB 14.6 02/08/2019  ? HCT 44.3 02/08/2019  ? MCV 98 (H) 02/08/2019  ? PLT 249 02/08/2019  ? ?Lab Results  ?Component Value Date  ? ALT 15 02/14/2021  ? AST 17 02/14/2021  ? ALKPHOS 66 02/14/2021  ? BILITOT 0.8 02/14/2021  ? ?No results found for: 25OHVITD2, [place], VD25OH  ? ?Review of Systems  ?Constitutional:  Negative  for chills and fever.  ?HENT:  Positive for ear pain. Negative for congestion, drooling, ear discharge, hearing loss, hoarse voice, rhinorrhea and sore throat.   ?Respiratory:  Negative for cough, shortness of breath and wheezing.   ?Cardiovascular:  Negative for chest pain, palpitations and leg swelling.  ?Gastrointestinal:  Negative for abdominal pain, blood in stool, constipation, diarrhea, nausea and vomiting.  ?Endocrine: Negative for polydipsia.  ?Genitourinary:  Negative for dysuria, frequency, hematuria and urgency.  ?Musculoskeletal:  Negative for back pain, myalgias and neck pain.  ?Skin:  Negative for rash.  ?Allergic/Immunologic: Negative for environmental allergies.  ?Neurological:  Negative for dizziness and headaches.  ?Hematological:  Does not bruise/bleed easily.  ?Psychiatric/Behavioral:  Negative for suicidal ideas. The patient is not nervous/anxious.   ? ?Patient Active Problem List  ? Diagnosis Date Noted  ? Family history of malignant neoplasm of gastrointestinal tract   ? Status post total knee replacement using cement, left 12/04/2017  ? Primary osteoarthritis of left knee 08/19/2017  ? Obesity (BMI 35.0-39.9 without comorbidity) 04/07/2017  ? Essential hypertension 05/02/2016  ? Mixed hyperlipidemia 05/02/2016  ? HSV-1 infection 05/02/2016  ? ? ?Allergies  ?Allergen Reactions  ? Codeine Nausea Only  ? Penicillins Rash  ? Sulfa Antibiotics Rash  ? ? ?Past Surgical History:  ?Procedure Laterality Date  ? APPENDECTOMY    ? COLONOSCOPY  08/2013  ? cleared for 5 yrs- Dr Allen Norris  ? COLONOSCOPY WITH PROPOFOL N/A 09/04/2018  ? Procedure: COLONOSCOPY WITH PROPOFOL;  Surgeon: Lucilla Lame, MD;  Location: [place];  Service: Endoscopy;  Laterality: N/A;  ? JOINT REPLACEMENT  11/18/2017  ? REPLACEMENT TOTAL KNEE Left 09/17/2017  ? VAGINAL HYSTERECTOMY    ? ? ?Social History  ? ?Tobacco Use  ? Smoking status: Never  ? Smokeless tobacco: Never  ?Vaping Use  ? Vaping Use: Never used  ?Substance Use  Topics  ? Alcohol use: Yes  ?  Alcohol/week: 18.0 standard drinks  ?  Types: 18 Cans of beer per week  ? Drug use: No  ? ? ? ?Medication list has been reviewed and updated. ? ?Current Meds  ?Medication Sig  ? aspirin 81 MG tablet Take 81 mg by mouth daily.  ? Calcium Carbonate (CALCIUM 600 PO) Take 1 tablet by mouth daily.  ? hydrochlorothiazide (HYDRODIURIL) 25 MG tablet TAKE 1 TABLET BY MOUTH DAILY  ? L-Lysine 1000 MG TABS Take 2 tablets (2,000 mg total) by mouth daily. otc  ? losartan (COZAAR) 50 MG tablet TAKE 1 TABLET BY MOUTH DAILY  ? mupirocin ointment (BACTROBAN) 2 % Apply 1 application topically 2 (two) times daily.  ? simvastatin (ZOCOR) 40 MG tablet TAKE 1 TABLET BY MOUTH DAILY  ? valACYclovir (VALTREX) 1000 MG tablet Take 1 tablet (1,000 mg total) by mouth 2 (two) times daily. As needed for breakout  ? vitamin B-12 (CYANOCOBALAMIN) 1000 MCG tablet Take 2,000 mcg by mouth daily.  ? ? ? ?  05/22/2021  ?  2:17 PM 03/22/2020  ? 10:19 AM 08/09/2019  ?  9:31 AM  ?GAD 7 : Generalized Anxiety Score  ?Nervous, Anxious, on Edge 0 0 1  ?Control/stop worrying 0 0 0  ?Worry too much - different things 0 0 0  ?Trouble relaxing 0 0 0  ?Restless 0 0 0  ?Easily annoyed or irritable 0 0 0  ?Afraid - awful might happen 0 0 0  ?Total GAD 7 Score 0 0 1  ?Anxiety Difficulty Not difficult at all  Not difficult at all  ? ? ? ?  05/22/2021  ?  2:17 PM  ?Depression screen PHQ 2/9  ?Decreased Interest 0  ?Down, Depressed, Hopeless 0  ?PHQ - 2 Score 0  ?Altered sleeping 0  ?Tired, decreased energy 0  ?Change in appetite 0  ?Feeling bad or failure about yourself  0  ?Trouble concentrating 0  ?Moving slowly or fidgety/restless 0  ?Suicidal thoughts 0  ?PHQ-9 Score 0  ?Difficult doing work/chores Not difficult at all  ? ? ?BP Readings from Last 3 Encounters:  ?05/22/21 126/72  ?02/14/21 130/60  ?01/26/21 138/80  ? ? ?Physical Exam ?Vitals and nursing note reviewed. Exam conducted with a chaperone present.  ?Constitutional:   ?   General: She  is not in acute distress. ?   Appearance: She is not diaphoretic.  ?HENT:  ?   Head: Normocephalic and atraumatic. No masses.  ?   Jaw: There is normal jaw occlusion. Tenderness present. No pain on movement or malocclusion.  ?   Salivary Glands: Right salivary gland is not diffusely enlarged or tender. Left salivary gland is not diffusely enlarged or tender.  ?   Right Ear: Tympanic membrane and external ear normal. There is no impacted cerumen.  ?   Left Ear: Tympanic membrane and external ear normal. There is no impacted cerumen.  ?   Nose: Nose  normal. No congestion or rhinorrhea.  ?   Mouth/Throat:  ?   Mouth: Mucous membranes are moist.  ?Eyes:  ?   General:     ?   Right eye: No discharge.     ?   Left eye: No discharge.  ?   Conjunctiva/sclera: Conjunctivae normal.  ?   Pupils: Pupils are equal, round, and reactive to light.  ?Neck:  ?   Thyroid: No thyroid mass, thyromegaly or thyroid tenderness.  ?   Vascular: Normal carotid pulses. No carotid bruit, hepatojugular reflux or JVD.  ?   Trachea: Trachea and phonation normal.  ?Cardiovascular:  ?   Rate and Rhythm: Normal rate and regular rhythm.  ?   Heart sounds: Normal heart sounds. No murmur heard. ?  No friction rub. No gallop.  ?Pulmonary:  ?   Effort: Pulmonary effort is normal.  ?   Breath sounds: Normal breath sounds. No wheezing, rhonchi or rales.  ?Abdominal:  ?   General: Bowel sounds are normal.  ?   Palpations: Abdomen is soft. There is no mass.  ?   Tenderness: There is no abdominal tenderness. There is no guarding or rebound.  ?Musculoskeletal:     ?   General: Normal range of motion.  ?   Cervical back: Normal range of motion and neck supple.  ?Lymphadenopathy:  ?   Cervical: No cervical adenopathy.  ?   Right cervical: No superficial, deep or posterior cervical adenopathy. ?   Left cervical: No superficial, deep or posterior cervical adenopathy.  ?Skin: ?   General: Skin is warm and dry.  ?   Findings: No erythema.  ?Neurological:  ?   Mental  Status: She is alert.  ?   Deep Tendon Reflexes: Reflexes are normal and symmetric.  ? ? ?Wt Readings from Last 3 Encounters:  ?05/22/21 228 lb (103.4 kg)  ?02/14/21 229 lb (103.9 kg)  ?01/26/21 230

## 2021-06-01 ENCOUNTER — Other Ambulatory Visit: Payer: Self-pay

## 2021-06-01 ENCOUNTER — Encounter: Payer: Self-pay | Admitting: Family Medicine

## 2021-06-01 DIAGNOSIS — M5440 Lumbago with sciatica, unspecified side: Secondary | ICD-10-CM

## 2021-06-20 ENCOUNTER — Other Ambulatory Visit: Payer: Self-pay | Admitting: Physician Assistant

## 2021-06-20 DIAGNOSIS — M5416 Radiculopathy, lumbar region: Secondary | ICD-10-CM

## 2021-06-27 ENCOUNTER — Ambulatory Visit
Admission: RE | Admit: 2021-06-27 | Discharge: 2021-06-27 | Disposition: A | Discharge status: Home / Self Care | Payer: Medicare Other | Source: Ambulatory Visit | Attending: Physician Assistant | Admitting: Physician Assistant

## 2021-06-27 DIAGNOSIS — M5416 Radiculopathy, lumbar region: Secondary | ICD-10-CM | POA: Diagnosis not present

## 2021-07-30 ENCOUNTER — Other Ambulatory Visit: Payer: Self-pay | Admitting: Neurosurgery

## 2021-07-30 DIAGNOSIS — M4310 Spondylolisthesis, site unspecified: Secondary | ICD-10-CM

## 2021-07-30 NOTE — Progress Notes (Unsigned)
Referring Physician:  Duanne Limerick, MD [address] [place],  [place] [address]  Primary Physician:  Duanne Limerick, MD  History of Present Illness: 07/31/2021 Ms. Ariele Vidrio is here today with a chief complaint of pain in her low back, tailbone, right buttock, and right posterior leg to her heel.  She began having pain in October 2022.  She reports nagging pain that is made worse by standing in 1 position.  Nothing really helps.  She does bend forward when she has to walk for any length of time.  That does help her pain.  She is unable to do everything she would like to do currently.  Bowel/Bladder Dysfunction: none  Conservative measures: chiropractor and acupuncture  Physical therapy: has not participated Multimodal medical therapy including regular antiinflammatories:  tylenol, meloxicam, prednisone, gabapentin Injections:  has not had any epidural steroid injections  Past Surgery:  denies  RAYAH FINES has no symptoms of cervical myelopathy.  The symptoms are causing a significant impact on the patient's life.   Review of Systems:  A 10 point review of systems is negative, except for the pertinent positives and negatives detailed in the HPI.  Past Medical History: Past Medical History:  Diagnosis Date   Allergy 01/22/1998   Arthritis    knees, lower back   Hyperlipidemia    Hypertension    Orthodontics    invisalign    Past Surgical History: Past Surgical History:  Procedure Laterality Date   APPENDECTOMY     COLONOSCOPY  08/2013   cleared for 5 yrs- Dr Servando Snare   COLONOSCOPY WITH PROPOFOL N/A 09/04/2018   Procedure: COLONOSCOPY WITH PROPOFOL;  Surgeon: Midge Minium, MD;  Location: [facility];  Service: Endoscopy;  Laterality: N/A;   JOINT REPLACEMENT  11/18/2017   REPLACEMENT TOTAL KNEE Left 09/17/2017   VAGINAL HYSTERECTOMY      Allergies: Allergies as of 07/31/2021 - Review Complete 07/31/2021  Allergen Reaction Noted   Codeine  Nausea Only 12/23/2014   Penicillins Rash 12/09/2014   Sulfa antibiotics Rash 12/09/2014    Medications: No outpatient medications have been marked as taking for the 07/31/21 encounter (Office Visit) with Venetia Night, MD.    Social History: Social History   Tobacco Use   Smoking status: Never   Smokeless tobacco: Never  Vaping Use   Vaping Use: Never used  Substance Use Topics   Alcohol use: Yes    Alcohol/week: 18.0 standard drinks of alcohol    Types: 18 Cans of beer per week   Drug use: No    Family Medical History: Family History  Problem Relation Age of Onset   Cancer Mother    Hypertension Father    Breast cancer Sister 49   ADD / ADHD Sister    Cancer Sister    Cancer Sister    Cancer Brother    Cancer Brother     Physical Examination: Vitals:   07/31/21 1101  BP: (!) 162/93  Pulse: 97    General: Patient is well developed, well nourished, calm, collected, and in no apparent distress. Attention to examination is appropriate.  Psychiatric: Patient is non-anxious.  Head:  Pupils equal, round, and reactive to light.  ENT:  Oral mucosa appears well hydrated.  Neck:   Supple.  Full range of motion.  Respiratory: Patient is breathing without any difficulty.  Extremities: No edema.  Vascular: Palpable dorsal pedal pulses.  Skin:   On exposed skin, there are no abnormal  skin lesions.  NEUROLOGICAL:     Awake, alert, oriented to person, place, and time.  Speech is clear and fluent. Fund of knowledge is appropriate.   Cranial Nerves: Pupils equal round and reactive to light.  Facial tone is symmetric.  Facial sensation is symmetric. Shoulder shrug is symmetric. Tongue protrusion is midline.  There is no pronator drift.   Strength: Side Biceps Triceps Deltoid Interossei Grip Wrist Ext. Wrist Flex.  R 5 5 5 5 5 5 5   L 5 5 5 5 5 5 5    Side Iliopsoas Quads Hamstring PF DF EHL  R 5 5 5 5 5 5   L 5 5 5 5 5 5    Reflexes are 1+ and symmetric at  the biceps, triceps, brachioradialis, patella and achilles.   Hoffman's is absent.  Clonus is not present.  Toes are down-going.  Bilateral upper and lower extremity sensation is intact to light touch.    No evidence of dysmetria noted.  Gait is abnormal - stooped forward.   No difficulty with tandem gait.    Medical Decision Making  Imaging: Flexion and Extension Lumbar spine 07/30/21 On my review, there is approximately 8 mm of anterolisthesis at both L4-5 and L5-S1 on extension that increases to 12 mm at L4-5 and 13 mm to L5-S1 on flexion.  MRI L spine 06/27/21 L4-L5: Broad-based disc bulge flattening ventral thecal sac. Severe bilateral facet arthropathy with bilateral facet effusions and a 10 x 10 x 13 mm intraspinal synovial cyst along the posteriorcentral aspect deforming the thecal sac with resultant severe spinal stenosis. Mild left foraminal narrowing. No right foraminal narrowing.   L5-S1: Mild broad-based disc bulge. Severe bilateral facet arthropathy with bilateral facet effusions. Bilateral subarticular recess stenosis. Mild bilateral foraminal stenosis.   IMPRESSION: 1. At L4-5 there is a broad-based disc bulge flattening ventral thecal sac. Severe bilateral facet arthropathy with bilateral facet effusions and a 10 x 10 x 13 mm intraspinal synovial cyst along the posteriorcentral aspect deforming the thecal sac with resultant severe spinal stenosis. Mild left foraminal narrowing. No right foraminal narrowing. 2. At L5-S1 there is a mild broad-based disc bulge. Severe bilateral facet arthropathy with bilateral facet effusions. Bilateral subarticular recess stenosis. Mild bilateral foraminal stenosis.     Electronically Signed   By: M.D.   On: 06/28/2021 16:02  Please note that there is extensive facet arthrosis at both L4-5 and L5-S1.  There is minimal anterolisthesis of L4-5 and L5-S1 on the supine MRI images.  I have personally reviewed the images  and agree with the above interpretation.  Assessment and Plan: Ms. Scrivens is a pleasant 66 y.o. female with anterolisthesis at L4-5 and L5-S1 due to facet arthrosis at L4-5 and L5-S1.  She has 2 to 3 mm of anterolisthesis at each level on supine films that extends to 12 to 13 mm on extension weightbearing films.  This implies lumbar spinal instability.  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  She has back pain with right-sided radicular pain.  Due to the magnitude of instability in her lumbar spine, I do not think that conservative management with physical therapy is likely to help her.  I think the most appropriate way forward is an L4-S1 transforaminal lumbar interbody fusion with decompression at the L4-5 and L5-S1 level and removal of her synovial cyst.  Her extradural synovial cyst causing substantial mass effect on the lumbar nerve roots, and warrants resection.  This will be performed at the same time as the stabilization procedure.  I discussed the planned procedure at length with the patient, including the risks, benefits, alternatives, and indications. The risks discussed include but are not limited to bleeding, infection, need for reoperation, spinal fluid leak, stroke, vision loss, anesthetic complication, coma, paralysis, and even death. I also described in detail that improvement was not guaranteed.  The patient expressed understanding of these risks, and asked that we proceed with surgery. I described the surgery in layman's terms, and gave ample opportunity for questions, which were answered to the best of my ability.    I spent a total of 45 minutes in face-to-face and non-face-to-face activities related to this patient's care today.  Thank you for involving me in the care of this patient.   This note was partially dictated using voice recognition software, so please excuse any errors that were not corrected.   Preslee Regas K. Myer Haff MD,  [facility]

## 2021-07-31 ENCOUNTER — Ambulatory Visit
Admission: RE | Admit: 2021-07-31 | Discharge: 2021-07-31 | Disposition: A | Discharge status: Home / Self Care | Payer: Medicare Other | Source: Ambulatory Visit | Attending: Neurosurgery | Admitting: Neurosurgery

## 2021-07-31 ENCOUNTER — Ambulatory Visit
Admission: RE | Admit: 2021-07-31 | Discharge: 2021-07-31 | Disposition: A | Discharge status: Home / Self Care | Payer: Medicare Other | Attending: Family Medicine | Admitting: Family Medicine

## 2021-07-31 ENCOUNTER — Encounter: Payer: Self-pay | Admitting: Neurosurgery

## 2021-07-31 ENCOUNTER — Ambulatory Visit: Payer: Medicare Other | Admitting: Neurosurgery

## 2021-07-31 VITALS — BP 162/93 | HR 97 | Ht 68.0 in | Wt 226.6 lb

## 2021-07-31 DIAGNOSIS — M7138 Other bursal cyst, other site: Secondary | ICD-10-CM

## 2021-07-31 DIAGNOSIS — M4310 Spondylolisthesis, site unspecified: Secondary | ICD-10-CM

## 2021-07-31 DIAGNOSIS — G8929 Other chronic pain: Secondary | ICD-10-CM

## 2021-07-31 DIAGNOSIS — M532X6 Spinal instabilities, lumbar region: Secondary | ICD-10-CM

## 2021-07-31 DIAGNOSIS — M4316 Spondylolisthesis, lumbar region: Secondary | ICD-10-CM

## 2021-07-31 NOTE — Patient Instructions (Signed)
Below is a summary of what we discussed in regards to your upcoming surgery:  Planned surgery: open L4-S1 transforaminal lumbar interbody fusion   Surgery date: 10/15/21 - you will find out your arrival time the business day before your surgery.   Blood thinners:   stop aspirin 7 days prior, resume aspirin 14 days after   NSAIDS: because you are having a fusion, no NSAIDS (such as ibuprofen, aleve, naproxen, meloxicam, diclofenac) for 3 months after surgery. Celebrex is an exception. Tylenol is ok because it is not an NSAID.   Surgical clearance: we will send a clearance request to Dr Yetta Barre   Pre-op appointment at [facility] Pre-admit Testing: we will call you with a date/time for this. Pre-admit testing is located on the first floor of the [facility], [address].   Pre-op labs will be done at your pre-op appointment. You are not required to fast for these labs.    Covid testing:  a pre-op Covid test is not required unless you are symptomatic or exposed.    Should you need to change your pre-op appointment, please call Pre-admit testing at [phone].    If you are having a fusion or arthroplasty procedure: for appointments after your 2 week follow-up: please arrive at the [facility] outpatient imaging center ([address], [address], [place]) or [facility] one hour prior to your appointment for x-rays. This applies to every appointment after your 2 week follow-up. Failure to do so may result in your appointment being rescheduled.   If you have FMLA/disability paperwork, please drop it off or fax it to [phone], attention [place].   If you have any questions/concerns before or after surgery, you can reach [place] at [phone], or you can send a mychart message. If you have a concern after hours that cannot wait until normal business hours, you can call [phone] or [phone] and ask the answering service to page the  neurosurgeon on call.   Appointments/FMLA & disability paperwork: [place] Nurse: Royston Cowper  Medical assistant: Irving Burton Surgeon: Dr Venetia Night Physician Assistant: Manning Charity

## 2021-08-12 ENCOUNTER — Other Ambulatory Visit: Payer: Self-pay | Admitting: Family Medicine

## 2021-08-12 DIAGNOSIS — J3501 Chronic tonsillitis: Secondary | ICD-10-CM

## 2021-08-14 ENCOUNTER — Ambulatory Visit: Payer: Medicare Other | Admitting: Family Medicine

## 2021-08-14 ENCOUNTER — Encounter: Payer: Self-pay | Admitting: Family Medicine

## 2021-08-14 ENCOUNTER — Telehealth: Payer: Self-pay

## 2021-08-14 VITALS — BP 120/80 | HR 80 | Ht 68.0 in | Wt 224.0 lb

## 2021-08-14 DIAGNOSIS — Z1231 Encounter for screening mammogram for malignant neoplasm of breast: Secondary | ICD-10-CM

## 2021-08-14 DIAGNOSIS — I10 Essential (primary) hypertension: Secondary | ICD-10-CM

## 2021-08-14 DIAGNOSIS — E782 Mixed hyperlipidemia: Secondary | ICD-10-CM

## 2021-08-14 MED ORDER — LOSARTAN POTASSIUM 50 MG PO TABS: 50.0000 mg | ORAL_TABLET | Freq: Every day | ORAL | 1 refills | Status: DC

## 2021-08-14 MED ORDER — HYDROCHLOROTHIAZIDE 25 MG PO TABS: 25.0000 mg | ORAL_TABLET | Freq: Every day | ORAL | 1 refills | Status: DC

## 2021-08-14 MED ORDER — SIMVASTATIN 40 MG PO TABS: 40.0000 mg | ORAL_TABLET | Freq: Every day | ORAL | 1 refills | Status: DC

## 2021-08-14 NOTE — Patient Instructions (Signed)
Why follow it? Research shows. Those who follow the Mediterranean diet have a reduced risk of heart disease  The diet is associated with a reduced incidence of Parkinson's and Alzheimer's diseases People following the diet may have longer life expectancies and lower rates of chronic diseases  The Dietary Guidelines for Americans recommends the Mediterranean diet as an eating plan to promote health and prevent disease  What Is the Mediterranean Diet?  Healthy eating plan based on typical foods and recipes of Mediterranean-style cooking The diet is primarily a plant based diet; these foods should make up a majority of meals   Starches - Plant based foods should make up a majority of meals - They are an important sources of vitamins, minerals, energy, antioxidants, and fiber - Choose whole grains, foods high in fiber and minimally processed items  - Typical grain sources include wheat, oats, barley, corn, brown rice, bulgar, farro, millet, polenta, couscous  - Various types of beans include chickpeas, lentils, fava beans, black beans, [place]ans beans   Fruits  Veggies - Large quantities of antioxidant rich fruits & veggies; 6 or more servings  - Vegetables can be eaten raw or lightly drizzled with oil and cooked  - Vegetables common to the traditional Mediterranean Diet include: artichokes, arugula, beets, broccoli, brussel sprouts, cabbage, carrots, celery, collard greens, cucumbers, eggplant, kale, leeks, lemons, lettuce, mushrooms, okra, onions, peas, peppers, potatoes, pumpkin, radishes, rutabaga, shallots, spinach, sweet potatoes, turnips, zucchini - Fruits common to the Mediterranean Diet include: apples, apricots, avocados, cherries, clementines, dates, figs, grapefruits, grapes, melons, nectarines, oranges, peaches, pears, pomegranates, strawberries, tangerines  Fats - Replace butter and margarine with healthy oils, such as olive oil, canola oil, and tahini  - Limit nuts to no more than a  handful a day  - Nuts include walnuts, almonds, pecans, pistachios, pine nuts  - Limit or avoid candied, honey roasted or heavily salted nuts - Olives are central to the Mediterranean diet - can be eaten whole or used in a variety of dishes   Meats Protein - Limiting red meat: no more than a few times a month - When eating red meat: choose lean cuts and keep the portion to the size of deck of cards - Eggs: approx. 0 to 4 times a week  - Fish and lean poultry: at least 2 a week  - Healthy protein sources include, chicken, turkey, lean beef, lamb - Increase intake of seafood such as tuna, salmon, trout, mackerel, shrimp, scallops - Avoid or limit high fat processed meats such as sausage and bacon  Dairy - Include moderate amounts of low fat dairy products  - Focus on healthy dairy such as fat free yogurt, skim milk, low or reduced fat cheese - Limit dairy products higher in fat such as whole or 2% milk, cheese, ice cream  Alcohol - Moderate amounts of red wine is ok  - No more than 5 oz daily for women (all ages) and men older than age 65  - No more than 10 oz of wine daily for men younger than 65  Other - Limit sweets and other desserts  - Use herbs and spices instead of salt to flavor foods  - Herbs and spices common to the traditional Mediterranean Diet include: basil, bay leaves, chives, cloves, cumin, fennel, garlic, lavender, marjoram, mint, oregano, parsley, pepper, rosemary, sage, savory, sumac, tarragon, thyme   It's not just a diet, it's a lifestyle:  The Mediterranean diet includes lifestyle factors typical of those in the   region  Foods, drinks and meals are best eaten with others and savored Daily physical activity is important for overall good health This could be strenuous exercise like running and aerobics This could also be more leisurely activities such as walking, housework, yard-work, or taking the stairs Moderation is the key; a balanced and healthy diet accommodates most  foods and drinks Consider portion sizes and frequency of consumption of certain foods   Meal Ideas & Options:  Breakfast:  Whole wheat toast or whole wheat English muffins with peanut butter & hard boiled egg Steel cut oats topped with apples & cinnamon and skim milk  Fresh fruit: banana, strawberries, melon, berries, peaches  Smoothies: strawberries, bananas, greek yogurt, peanut butter Low fat greek yogurt with blueberries and granola  Egg Glantz omelet with spinach and mushrooms Breakfast couscous: whole wheat couscous, apricots, skim milk, cranberries  Sandwiches:  Hummus and grilled vegetables (peppers, zucchini, squash) on whole wheat bread   Grilled chicken on whole wheat pita with lettuce, tomatoes, cucumbers or tzatziki  Tuna salad on whole wheat bread: tuna salad made with greek yogurt, olives, red peppers, capers, green onions Garlic rosemary lamb pita: lamb sauted with garlic, rosemary, salt & pepper; add lettuce, cucumber, greek yogurt to pita - flavor with lemon juice and black pepper  Seafood:  Mediterranean grilled salmon, seasoned with garlic, basil, parsley, lemon juice and black pepper Shrimp, lemon, and spinach whole-grain pasta salad made with low fat greek yogurt  Seared scallops with lemon orzo  Seared tuna steaks seasoned salt, pepper, coriander topped with tomato mixture of olives, tomatoes, olive oil, minced garlic, parsley, green onions and cappers  Meats:  Herbed greek chicken salad with kalamata olives, cucumber, feta  Red bell peppers stuffed with spinach, bulgur, lean ground beef (or lentils) & topped with feta   Kebabs: skewers of chicken, tomatoes, onions, zucchini, squash  Turkey burgers: made with red onions, mint, dill, lemon juice, feta cheese topped with roasted red peppers Vegetarian Cucumber salad: cucumbers, artichoke hearts, celery, red onion, feta cheese, tossed in olive oil & lemon juice  Hummus and whole grain pita points with a greek salad  (lettuce, tomato, feta, olives, cucumbers, red onion) Lentil soup with celery, carrots made with vegetable broth, garlic, salt and pepper  Tabouli salad: parsley, bulgur, mint, scallions, cucumbers, tomato, radishes, lemon juice, olive oil, salt and pepper.      

## 2021-08-14 NOTE — Telephone Encounter (Signed)
Called pt with mammo appt- scheduled for [place] @ 11:20 08/15/21

## 2021-08-14 NOTE — Progress Notes (Signed)
Date:  08/14/2021   Name:  Katrina Crawford   DOB:  1955-06-11   MRN:  [id number]   Chief Complaint: Hypertension and Hyperlipidemia  Hypertension This is a chronic problem. The current episode started more than 1 year ago. The problem has been gradually improving since onset. The problem is controlled. Pertinent negatives include no blurred vision, chest pain, headaches, orthopnea, palpitations, peripheral edema, PND or shortness of breath. Past treatments include angiotensin blockers and diuretics. The current treatment provides moderate improvement. There are no compliance problems.  There is no history of angina, kidney disease, CAD/MI, CVA, heart failure, left ventricular hypertrophy, PVD or retinopathy. There is no history of chronic renal disease, a hypertension causing med or renovascular disease.  Hyperlipidemia This is a chronic problem. The current episode started more than 1 year ago. The problem is controlled. Recent lipid tests were reviewed and are normal. Exacerbating diseases include obesity. She has no history of chronic renal disease, diabetes, hypothyroidism, liver disease or nephrotic syndrome. Pertinent negatives include no chest pain or shortness of breath.    Lab Results  Component Value Date   NA 139 02/14/2021   K 4.1 02/14/2021   CO2 26 02/14/2021   GLUCOSE 103 (H) 02/14/2021   BUN 13 02/14/2021   CREATININE 0.74 02/14/2021   CALCIUM 10.1 02/14/2021   EGFR 89 02/14/2021   GFRNONAA 85 08/09/2019   Lab Results  Component Value Date   CHOL 198 02/14/2021   HDL 68 02/14/2021   LDLCALC 113 (H) 02/14/2021   TRIG 98 02/14/2021   CHOLHDL 3.5 07/01/2017   No results found for: "TSH" No results found for: "HGBA1C" Lab Results  Component Value Date   WBC 5.9 02/08/2019   HGB 14.6 02/08/2019   HCT 44.3 02/08/2019   MCV 98 (H) 02/08/2019   PLT 249 02/08/2019   Lab Results  Component Value Date   ALT 15 02/14/2021   AST 17 02/14/2021   ALKPHOS 66 02/14/2021    BILITOT 0.8 02/14/2021   No results found for: "25OHVITD2", "25OHVITD3", "VD25OH"   Review of Systems  Constitutional:  Negative for chills, diaphoresis, fatigue, fever and unexpected weight change.  HENT:  Negative for ear pain and hearing loss.   Eyes:  Negative for blurred vision and visual disturbance.  Respiratory:  Negative for chest tightness, shortness of breath and wheezing.   Cardiovascular:  Negative for chest pain, palpitations, orthopnea, leg swelling and PND.  Gastrointestinal:  Negative for abdominal distention, blood in stool, constipation, diarrhea, nausea and vomiting.  Genitourinary:  Negative for difficulty urinating and vaginal bleeding.  Skin:  Negative for color change.  Neurological:  Negative for headaches.    Patient Active Problem List   Diagnosis Date Noted   Family history of malignant neoplasm of gastrointestinal tract    Status post total knee replacement using cement, left 12/04/2017   Primary osteoarthritis of left knee 08/19/2017   Obesity (BMI 35.0-39.9 without comorbidity) 04/07/2017   Essential hypertension 05/02/2016   Mixed hyperlipidemia 05/02/2016   HSV-1 infection 05/02/2016    Allergies  Allergen Reactions   Codeine Nausea Only   Penicillins Rash   Sulfa Antibiotics Rash    Past Surgical History:  Procedure Laterality Date   APPENDECTOMY     COLONOSCOPY  08/2013   cleared for 5 yrs- Dr Allen Norris   COLONOSCOPY WITH PROPOFOL N/A 09/04/2018   Procedure: COLONOSCOPY WITH PROPOFOL;  Surgeon: Lucilla Lame, MD;  Location: [place];  Service: Endoscopy;  Laterality:  N/A;   JOINT REPLACEMENT  11/18/2017   REPLACEMENT TOTAL KNEE Left 09/17/2017   VAGINAL HYSTERECTOMY      Social History   Tobacco Use   Smoking status: Never   Smokeless tobacco: Never  Vaping Use   Vaping Use: Never used  Substance Use Topics   Alcohol use: Yes    Alcohol/week: 18.0 standard drinks of alcohol    Types: 18 Cans of beer per week   Drug  use: No     Medication list has been reviewed and updated.  Current Meds  Medication Sig   Calcium Carbonate (CALCIUM 600 PO) Take 1 tablet by mouth daily.   gabapentin (NEURONTIN) 100 MG capsule Take 100 mg by mouth at bedtime.   hydrochlorothiazide (HYDRODIURIL) 25 MG tablet TAKE 1 TABLET BY MOUTH DAILY   L-Lysine 1000 MG TABS Take 2 tablets (2,000 mg total) by mouth daily. otc   losartan (COZAAR) 50 MG tablet TAKE 1 TABLET BY MOUTH DAILY   mupirocin ointment (BACTROBAN) 2 % Apply 1 application topically 2 (two) times daily.   simvastatin (ZOCOR) 40 MG tablet TAKE 1 TABLET BY MOUTH DAILY   valACYclovir (VALTREX) 1000 MG tablet Take 1 tablet (1,000 mg total) by mouth 2 (two) times daily. As needed for breakout   vitamin B-12 (CYANOCOBALAMIN) 1000 MCG tablet Take 2,000 mcg by mouth daily.   [DISCONTINUED] aspirin 81 MG tablet Take 81 mg by mouth daily.       08/14/2021   10:32 AM 05/22/2021    2:17 PM 03/22/2020   10:19 AM 08/09/2019    9:31 AM  GAD 7 : Generalized Anxiety Score  Nervous, Anxious, on Edge 0 0 0 1  Control/stop worrying 0 0 0 0  Worry too much - different things 0 0 0 0  Trouble relaxing 0 0 0 0  Restless 0 0 0 0  Easily annoyed or irritable 0 0 0 0  Afraid - awful might happen 0 0 0 0  Total GAD 7 Score 0 0 0 1  Anxiety Difficulty Not difficult at all Not difficult at all  Not difficult at all       08/14/2021   10:32 AM 05/22/2021    2:17 PM 10/23/2020    2:56 PM  Depression screen PHQ 2/9  Decreased Interest 0 0 0  Down, Depressed, Hopeless 0 0 0  PHQ - 2 Score 0 0 0  Altered sleeping 0 0   Tired, decreased energy 0 0   Change in appetite 0 0   Feeling bad or failure about yourself  0 0   Trouble concentrating 0 0   Moving slowly or fidgety/restless 0 0   Suicidal thoughts 0 0   PHQ-9 Score 0 0   Difficult doing work/chores Not difficult at all Not difficult at all     BP Readings from Last 3 Encounters:  08/14/21 120/80  07/31/21 (!) 162/93   05/22/21 126/72    Physical Exam Vitals and nursing note reviewed. Exam conducted with a chaperone present.  Constitutional:      General: She is not in acute distress.    Appearance: She is not diaphoretic.  HENT:     Head: Normocephalic and atraumatic.     Right Ear: External ear normal.     Left Ear: External ear normal.     Nose: Nose normal. No congestion or rhinorrhea.     Mouth/Throat:     Mouth: Mucous membranes are moist.  Eyes:  General:        Right eye: No discharge.        Left eye: No discharge.     Conjunctiva/sclera: Conjunctivae normal.     Pupils: Pupils are equal, round, and reactive to light.  Neck:     Thyroid: No thyromegaly.     Vascular: No JVD.  Cardiovascular:     Rate and Rhythm: Normal rate and regular rhythm.     Heart sounds: Normal heart sounds. No murmur heard.    No friction rub. No gallop.  Pulmonary:     Effort: Pulmonary effort is normal.     Breath sounds: Normal breath sounds. No wheezing or rhonchi.  Chest:  Breasts:    Right: Normal. No swelling, bleeding, inverted nipple, mass, nipple discharge, skin change or tenderness.     Left: Normal. No swelling, bleeding, inverted nipple, mass, nipple discharge, skin change or tenderness.  Abdominal:     General: Bowel sounds are normal.     Palpations: Abdomen is soft. There is no mass.     Tenderness: There is no abdominal tenderness. There is no right CVA tenderness, left CVA tenderness, guarding or rebound.  Musculoskeletal:        General: Normal range of motion.     Cervical back: Normal range of motion and neck supple.  Lymphadenopathy:     Cervical: No cervical adenopathy.     Upper Body:     Right upper body: No supraclavicular or axillary adenopathy.     Left upper body: No supraclavicular or axillary adenopathy.  Skin:    General: Skin is warm and dry.  Neurological:     Mental Status: She is alert.     Deep Tendon Reflexes: Reflexes are normal and symmetric.     Wt  Readings from Last 3 Encounters:  08/14/21 224 lb (101.6 kg)  07/31/21 226 lb 9.6 oz (102.8 kg)  05/22/21 228 lb (103.4 kg)    BP 120/80   Pulse 80   Ht $R'5\' 8"'eQ$  (1.727 m)   Wt 224 lb (101.6 kg)   BMI 34.06 kg/m   Assessment and Plan:  1. Essential hypertension Chronic.  Controlled.  Stable.  Blood pressure 120/80.  Asymptomatic.  Tolerating medications well.  Continue hydrochlorothiazide 25 mg once a day and losartan 50 mg once a day.  Review of previous labs are noted. - hydrochlorothiazide (HYDRODIURIL) 25 MG tablet; Take 1 tablet (25 mg total) by mouth daily.  Dispense: 90 tablet; Refill: 1 - losartan (COZAAR) 50 MG tablet; Take 1 tablet (50 mg total) by mouth daily.  Dispense: 90 tablet; Refill: 1  2. Mixed hyperlipidemia Chronic.  Controlled.  Stable. Patient tolerating simvastatin 40 mg once a day.  And we will continue this.  We will obtain lipid panel for current surveillance of LDL. - simvastatin (ZOCOR) 40 MG tablet; Take 1 tablet (40 mg total) by mouth daily.  Dispense: 90 tablet; Refill: 1 - Lipid Panel With LDL/HDL Ratio  3. Breast cancer screening by mammogram Breast exam is normal with no palpable mass or any abnormalities of either breast.  Patient is scheduled for 3D screening mammogram bilateral. - MM 3D SCREEN BREAST BILATERAL

## 2021-08-15 ENCOUNTER — Ambulatory Visit
Admission: RE | Admit: 2021-08-15 | Discharge: 2021-08-15 | Disposition: A | Discharge status: Home / Self Care | Payer: Medicare Other | Source: Ambulatory Visit | Attending: Family Medicine | Admitting: Family Medicine

## 2021-08-15 DIAGNOSIS — Z1231 Encounter for screening mammogram for malignant neoplasm of breast: Secondary | ICD-10-CM | POA: Diagnosis present

## 2021-08-15 LAB — LIPID PANEL WITH LDL/HDL RATIO
Cholesterol, Total: 193 mg/dL (ref 100–199)
HDL: 64 mg/dL (ref >39)
LDL Chol Calc (NIH): 109 mg/dL — ABNORMAL HIGH (ref 0–99)
LDL/HDL Ratio: 1.7 ratio (ref 0.0–3.2)
Triglycerides: 115 mg/dL (ref 0–149)
VLDL Cholesterol Cal: 20 mg/dL (ref 5–40)

## 2021-08-21 ENCOUNTER — Ambulatory Visit: Payer: Medicare Other | Admitting: Family Medicine

## 2021-08-23 ENCOUNTER — Other Ambulatory Visit: Payer: Self-pay

## 2021-08-23 DIAGNOSIS — Z01818 Encounter for other preprocedural examination: Secondary | ICD-10-CM

## 2021-09-21 ENCOUNTER — Encounter: Payer: Self-pay | Admitting: Family Medicine

## 2021-09-21 ENCOUNTER — Ambulatory Visit: Payer: Medicare Other | Admitting: Family Medicine

## 2021-09-21 VITALS — BP 130/76 | HR 100 | Ht 68.0 in | Wt 224.0 lb

## 2021-09-21 DIAGNOSIS — N3090 Cystitis, unspecified without hematuria: Secondary | ICD-10-CM

## 2021-09-21 LAB — POCT URINALYSIS DIPSTICK
Bilirubin, UA: NEGATIVE
Blood, UA: NEGATIVE
Glucose, UA: NEGATIVE
Ketones, UA: NEGATIVE
Leukocytes, UA: NEGATIVE
Nitrite, UA: NEGATIVE
Protein, UA: NEGATIVE
Spec Grav, UA: 1.01 (ref 1.010–1.025)
Urobilinogen, UA: 0.2 E.U./dL
pH, UA: 6 (ref 5.0–8.0)

## 2021-09-21 MED ORDER — FLUCONAZOLE 150 MG PO TABS: 150.0000 mg | ORAL_TABLET | Freq: Once | ORAL | 0 refills | Status: AC

## 2021-09-21 MED ORDER — NITROFURANTOIN MONOHYD MACRO 100 MG PO CAPS: 100.0000 mg | ORAL_CAPSULE | Freq: Two times a day (BID) | ORAL | 0 refills | Status: DC

## 2021-09-21 NOTE — Progress Notes (Signed)
Date:  09/21/2021   Name:  Katrina Crawford   DOB:  09-06-1955   MRN:  [id number]   Chief Complaint: Urinary Tract Infection (Pain and pressure started Monday morning. Some burning- worse when gets up in the morning)  Urinary Tract Infection  This is a recurrent problem. The current episode started in the past 7 days. The quality of the pain is described as burning and stabbing. The pain is moderate. Pertinent negatives include no chills, discharge, flank pain, frequency or urgency. The treatment provided mild relief.    Lab Results  Component Value Date   NA 139 02/14/2021   K 4.1 02/14/2021   CO2 26 02/14/2021   GLUCOSE 103 (H) 02/14/2021   BUN 13 02/14/2021   CREATININE 0.74 02/14/2021   CALCIUM 10.1 02/14/2021   EGFR 89 02/14/2021   GFRNONAA 85 08/09/2019   Lab Results  Component Value Date   CHOL 193 08/14/2021   HDL 64 08/14/2021   LDLCALC 109 (H) 08/14/2021   TRIG 115 08/14/2021   CHOLHDL 3.5 07/01/2017   No results found for: "TSH" No results found for: "HGBA1C" Lab Results  Component Value Date   WBC 5.9 02/08/2019   HGB 14.6 02/08/2019   HCT 44.3 02/08/2019   MCV 98 (H) 02/08/2019   PLT 249 02/08/2019   Lab Results  Component Value Date   ALT 15 02/14/2021   AST 17 02/14/2021   ALKPHOS 66 02/14/2021   BILITOT 0.8 02/14/2021   No results found for: "25OHVITD2", "25OHVITD3", "VD25OH"   Review of Systems  Constitutional:  Negative for chills, fatigue and fever.  HENT:  Negative for postnasal drip.   Genitourinary:  Positive for dysuria. Negative for flank pain, frequency, pelvic pain and urgency.    Patient Active Problem List   Diagnosis Date Noted   Family history of malignant neoplasm of gastrointestinal tract    Status post total knee replacement using cement, left 12/04/2017   Primary osteoarthritis of left knee 08/19/2017   Obesity (BMI 35.0-39.9 without comorbidity) 04/07/2017   Essential hypertension 05/02/2016   Mixed hyperlipidemia  05/02/2016   HSV-1 infection 05/02/2016    Allergies  Allergen Reactions   Codeine Nausea Only   Penicillins Rash   Sulfa Antibiotics Rash    Past Surgical History:  Procedure Laterality Date   APPENDECTOMY     COLONOSCOPY  08/2013   cleared for 5 yrs- Dr Servando Snare   COLONOSCOPY WITH PROPOFOL N/A 09/04/2018   Procedure: COLONOSCOPY WITH PROPOFOL;  Surgeon: Midge Minium, MD;  Location: [facility];  Service: Endoscopy;  Laterality: N/A;   JOINT REPLACEMENT  11/18/2017   REPLACEMENT TOTAL KNEE Left 09/17/2017   VAGINAL HYSTERECTOMY      Social History   Tobacco Use   Smoking status: Never   Smokeless tobacco: Never  Vaping Use   Vaping Use: Never used  Substance Use Topics   Alcohol use: Yes    Alcohol/week: 18.0 standard drinks of alcohol    Types: 18 Cans of beer per week   Drug use: No     Medication list has been reviewed and updated.  Current Meds  Medication Sig   Calcium Carbonate (CALCIUM 600 PO) Take 1 tablet by mouth daily.   gabapentin (NEURONTIN) 100 MG capsule Take 100 mg by mouth at bedtime.   hydrochlorothiazide (HYDRODIURIL) 25 MG tablet Take 1 tablet (25 mg total) by mouth daily.   L-Lysine 1000 MG TABS Take 2 tablets (2,000 mg total) by mouth daily.  otc   losartan (COZAAR) 50 MG tablet Take 1 tablet (50 mg total) by mouth daily.   mupirocin ointment (BACTROBAN) 2 % Apply 1 application topically 2 (two) times daily.   simvastatin (ZOCOR) 40 MG tablet Take 1 tablet (40 mg total) by mouth daily.   valACYclovir (VALTREX) 1000 MG tablet Take 1 tablet (1,000 mg total) by mouth 2 (two) times daily. As needed for breakout   vitamin B-12 (CYANOCOBALAMIN) 1000 MCG tablet Take 2,000 mcg by mouth daily.       09/21/2021    2:34 PM 08/14/2021   10:32 AM 05/22/2021    2:17 PM 03/22/2020   10:19 AM  GAD 7 : Generalized Anxiety Score  Nervous, Anxious, on Edge 0 0 0 0  Control/stop worrying 0 0 0 0  Worry too much - different things 0 0 0 0  Trouble relaxing  0 0 0 0  Restless 0 0 0 0  Easily annoyed or irritable 0 0 0 0  Afraid - awful might happen 0 0 0 0  Total GAD 7 Score 0 0 0 0  Anxiety Difficulty Not difficult at all Not difficult at all Not difficult at all        09/21/2021    2:34 PM 08/14/2021   10:32 AM 05/22/2021    2:17 PM  Depression screen PHQ 2/9  Decreased Interest 0 0 0  Down, Depressed, Hopeless 0 0 0  PHQ - 2 Score 0 0 0  Altered sleeping 0 0 0  Tired, decreased energy 0 0 0  Change in appetite 0 0 0  Feeling bad or failure about yourself  0 0 0  Trouble concentrating 0 0 0  Moving slowly or fidgety/restless 0 0 0  Suicidal thoughts 0 0 0  PHQ-9 Score 0 0 0  Difficult doing work/chores Not difficult at all Not difficult at all Not difficult at all    BP Readings from Last 3 Encounters:  09/21/21 130/76  08/14/21 120/80  07/31/21 (!) 162/93    Physical Exam Vitals and nursing note reviewed.  Constitutional:      Appearance: She is well-developed.  HENT:     Head: Normocephalic.     Right Ear: Tympanic membrane and external ear normal.     Left Ear: Tympanic membrane and external ear normal.  Eyes:     General: Lids are everted, no foreign bodies appreciated. No scleral icterus.       Left eye: No foreign body or hordeolum.     Conjunctiva/sclera: Conjunctivae normal.     Right eye: Right conjunctiva is not injected.     Left eye: Left conjunctiva is not injected.     Pupils: Pupils are equal, round, and reactive to light.  Neck:     Thyroid: No thyromegaly.     Vascular: No JVD.     Trachea: No tracheal deviation.  Cardiovascular:     Rate and Rhythm: Normal rate and regular rhythm.     Heart sounds: Normal heart sounds. No murmur heard.    No friction rub. No gallop.  Pulmonary:     Effort: Pulmonary effort is normal. No respiratory distress.     Breath sounds: Normal breath sounds. No wheezing or rales.  Abdominal:     General: Bowel sounds are normal.     Palpations: Abdomen is soft. There is  no mass.     Tenderness: There is no abdominal tenderness. There is no guarding or rebound.  Musculoskeletal:  General: No tenderness. Normal range of motion.     Cervical back: Normal range of motion and neck supple.  Lymphadenopathy:     Cervical: No cervical adenopathy.  Skin:    General: Skin is warm.     Findings: No rash.  Neurological:     Mental Status: She is alert and oriented to person, place, and time.     Cranial Nerves: No cranial nerve deficit.     Deep Tendon Reflexes: Reflexes normal.  Psychiatric:        Mood and Affect: Mood is not anxious or depressed.     Wt Readings from Last 3 Encounters:  09/21/21 224 lb (101.6 kg)  08/14/21 224 lb (101.6 kg)  07/31/21 226 lb 9.6 oz (102.8 kg)    BP 130/76   Pulse 100   Ht $R'5\' 8"'GI$  (1.727 m)   Wt 224 lb (101.6 kg)   BMI 34.06 kg/m   Assessment and Plan:  1. Cystitis New onset.  Persistent.  Relatively stable with frequency urgency but this is similar to previous episodes.  This is probably more of a urethritis and we will treat with Macrobid 1 twice a day for 5 days.  Patient also been given a Diflucan as well. - POCT urinalysis dipstick - nitrofurantoin, macrocrystal-monohydrate, (MACROBID) 100 MG capsule; Take 1 capsule (100 mg total) by mouth 2 (two) times daily for 5 days.  Dispense: 10 capsule; Refill: 0 - fluconazole (DIFLUCAN) 150 MG tablet; Take 1 tablet (150 mg total) by mouth once for 1 dose.  Dispense: 1 tablet; Refill: 0    Otilio Miu, MD

## 2021-10-03 ENCOUNTER — Other Ambulatory Visit: Payer: Medicare Other

## 2021-10-09 ENCOUNTER — Encounter
Admission: RE | Admit: 2021-10-09 | Discharge: 2021-10-09 | Disposition: A | Discharge status: Home / Self Care | Payer: Medicare Other | Source: Ambulatory Visit | Attending: Neurosurgery | Admitting: Neurosurgery

## 2021-10-09 VITALS — BP 133/70 | HR 99 | Resp 16 | Ht 68.0 in | Wt 225.0 lb

## 2021-10-09 DIAGNOSIS — Z01818 Encounter for other preprocedural examination: Secondary | ICD-10-CM | POA: Diagnosis not present

## 2021-10-09 DIAGNOSIS — E782 Mixed hyperlipidemia: Secondary | ICD-10-CM | POA: Diagnosis not present

## 2021-10-09 DIAGNOSIS — Z6835 Body mass index (BMI) 35.0-35.9, adult: Secondary | ICD-10-CM | POA: Diagnosis not present

## 2021-10-09 DIAGNOSIS — I10 Essential (primary) hypertension: Secondary | ICD-10-CM | POA: Diagnosis not present

## 2021-10-09 DIAGNOSIS — E669 Obesity, unspecified: Secondary | ICD-10-CM | POA: Insufficient documentation

## 2021-10-09 LAB — CBC
HCT: 39.3 % (ref 36.0–46.0)
Hemoglobin: 13.6 g/dL (ref 12.0–15.0)
MCH: 32.3 pg (ref 26.0–34.0)
MCHC: 34.6 g/dL (ref 30.0–36.0)
MCV: 93.3 fL (ref 80.0–100.0)
Platelets: 243 10*3/uL (ref 150–400)
RBC: 4.21 MIL/uL (ref 3.87–5.11)
RDW: 11.8 % (ref 11.5–15.5)
WBC: 5.7 10*3/uL (ref 4.0–10.5)
nRBC: 0 % (ref 0.0–0.2)

## 2021-10-09 LAB — TYPE AND SCREEN
ABO/RH(D): A POS
Antibody Screen: NEGATIVE

## 2021-10-09 LAB — URINALYSIS, ROUTINE W REFLEX MICROSCOPIC
Bilirubin Urine: NEGATIVE
Glucose, UA: NEGATIVE mg/dL
Hgb urine dipstick: NEGATIVE
Ketones, ur: NEGATIVE mg/dL
Leukocytes,Ua: NEGATIVE
Nitrite: NEGATIVE
Protein, ur: NEGATIVE mg/dL
Specific Gravity, Urine: 1.005 (ref 1.005–1.030)
pH: 6 (ref 5.0–8.0)

## 2021-10-09 LAB — SURGICAL PCR SCREEN
MRSA, PCR: NEGATIVE
Staphylococcus aureus: NEGATIVE

## 2021-10-09 LAB — BASIC METABOLIC PANEL
Anion gap: 9 (ref 5–15)
BUN: 17 mg/dL (ref 8–23)
CO2: 28 mmol/L (ref 22–32)
Calcium: 10 mg/dL (ref 8.9–10.3)
Chloride: 99 mmol/L (ref 98–111)
Creatinine, Ser: 0.59 mg/dL (ref 0.44–1.00)
GFR, Estimated: >60 mL/min (ref >60)
Glucose, Bld: 95 mg/dL (ref 70–99)
Potassium: 3.5 mmol/L (ref 3.5–5.1)
Sodium: 136 mmol/L (ref 135–145)

## 2021-10-09 NOTE — Patient Instructions (Signed)
Your procedure is scheduled on: 10/15/21 Report to [place]. To find out your arrival time please call [phone] between 1PM - 3PM on 10/12/21.  Remember: Instructions that are not followed completely may result in serious medical risk, up to and including death, or upon the discretion of your surgeon and anesthesiologist your surgery may need to be rescheduled.     _X__ 1. Do not eat food after midnight the night before your procedure.                 No gum chewing or hard candies. You may drink clear liquids up to 2 hours                 before you are scheduled to arrive for your surgery- DO not drink clear                 liquids within 2 hours of the start of your surgery.                 Clear Liquids include:  water, apple juice without pulp, clear carbohydrate                 drink such as Clearfast or Gatorade, Black Coffee or Tea (Do not add                 anything to coffee or tea). Diabetics water only  __X__2.  On the morning of surgery brush your teeth with toothpaste and water, you                 may rinse your mouth with mouthwash if you wish.  Do not swallow any              toothpaste of mouthwash.     _X__ 3.  No Alcohol for 24 hours before or after surgery.   _X__ 4.  Do Not Smoke or use e-cigarettes For 24 Hours Prior to Your Surgery.                 Do not use any chewable tobacco products for at least 6 hours prior to                 surgery.  ____  5.  Bring all medications with you on the day of surgery if instructed.   __X__  6.  Notify your doctor if there is any change in your medical condition      (cold, fever, infections).     Do not wear jewelry, make-up, hairpins, clips or nail polish. Do not wear lotions, powders, or perfumes.  Do not shave body hair 48 hours prior to surgery. Men may shave face and neck. Do not bring valuables to the hospital.    [facility] is not responsible for any  belongings or valuables.  Contacts, dentures/partials or body piercings may not be worn into surgery. Bring a case for your contacts, glasses or hearing aids, a denture cup will be supplied. Leave your suitcase in the car. After surgery it may be brought to your room. For patients admitted to the hospital, discharge time is determined by your treatment team.   Patients discharged the day of surgery will not be allowed to drive home.   Please read over the following fact sheets that you were given:   MRSA Information, CHG soap  __X__ Take these medicines the morning of surgery with A SIP  OF WATER:    1. May take valtrex if needed  2.   3.   4.  5.  6.  ____ Fleet Enema (as directed)   __X__ Use CHG Soap/SAGE wipes as directed  ____ Use inhalers on the day of surgery  ____ Stop metformin/Janumet/Farxiga 2 days prior to surgery    ____ Take 1/2 of usual insulin dose the night before surgery. No insulin the morning          of surgery.   ____ Stop Blood Thinners Coumadin/Plavix/Xarelto/Pleta/Pradaxa/Eliquis/Effient/Aspirin  on   Or contact your Surgeon, Cardiologist or Medical Doctor regarding  ability to stop your blood thinners  __X__ Stop Anti-inflammatories 7 days before surgery such as Advil, Ibuprofen, Motrin,  BC or Goodies Powder, Naprosyn, Naproxen, Aleve, Aspirin   You may take Tylenol (stay below 3000 mg per day)  __X__ Stop all herbals and supplements, fish oil or vitamins  until after surgery.    ____ Bring C-Pap to the hospital.

## 2021-10-12 ENCOUNTER — Encounter: Payer: Self-pay | Admitting: Family Medicine

## 2021-10-15 ENCOUNTER — Inpatient Hospital Stay: Payer: Medicare Other

## 2021-10-15 ENCOUNTER — Inpatient Hospital Stay: Payer: Medicare Other | Admitting: Urgent Care

## 2021-10-15 ENCOUNTER — Other Ambulatory Visit: Payer: Self-pay

## 2021-10-15 ENCOUNTER — Encounter: Payer: Self-pay | Admitting: Neurosurgery

## 2021-10-15 ENCOUNTER — Encounter
Admission: RE | Disposition: A | Discharge status: Home / Self Care | Payer: Self-pay | Source: Ambulatory Visit | Attending: Neurosurgery

## 2021-10-15 ENCOUNTER — Inpatient Hospital Stay
Admission: RE | Admit: 2021-10-15 | Discharge: 2021-10-17 | DRG: 455 | Disposition: A | Discharge status: Home / Self Care | Payer: Medicare Other | Source: Ambulatory Visit | Attending: Neurosurgery | Admitting: Neurosurgery

## 2021-10-15 DIAGNOSIS — Z96652 Presence of left artificial knee joint: Secondary | ICD-10-CM | POA: Diagnosis present

## 2021-10-15 DIAGNOSIS — M7138 Other bursal cyst, other site: Secondary | ICD-10-CM | POA: Diagnosis present

## 2021-10-15 DIAGNOSIS — M48061 Spinal stenosis, lumbar region without neurogenic claudication: Secondary | ICD-10-CM | POA: Diagnosis present

## 2021-10-15 DIAGNOSIS — I10 Essential (primary) hypertension: Secondary | ICD-10-CM | POA: Diagnosis present

## 2021-10-15 DIAGNOSIS — E785 Hyperlipidemia, unspecified: Secondary | ICD-10-CM | POA: Diagnosis present

## 2021-10-15 DIAGNOSIS — Z882 Allergy status to sulfonamides status: Secondary | ICD-10-CM | POA: Diagnosis not present

## 2021-10-15 DIAGNOSIS — M5441 Lumbago with sciatica, right side: Secondary | ICD-10-CM | POA: Diagnosis present

## 2021-10-15 DIAGNOSIS — M4310 Spondylolisthesis, site unspecified: Secondary | ICD-10-CM | POA: Diagnosis not present

## 2021-10-15 DIAGNOSIS — G8929 Other chronic pain: Secondary | ICD-10-CM | POA: Diagnosis present

## 2021-10-15 DIAGNOSIS — M532X6 Spinal instabilities, lumbar region: Secondary | ICD-10-CM | POA: Diagnosis present

## 2021-10-15 DIAGNOSIS — M4316 Spondylolisthesis, lumbar region: Principal | ICD-10-CM

## 2021-10-15 DIAGNOSIS — E669 Obesity, unspecified: Secondary | ICD-10-CM

## 2021-10-15 DIAGNOSIS — Z885 Allergy status to narcotic agent status: Secondary | ICD-10-CM | POA: Diagnosis not present

## 2021-10-15 DIAGNOSIS — Z880 Allergy status to penicillin: Secondary | ICD-10-CM | POA: Diagnosis not present

## 2021-10-15 DIAGNOSIS — Z01818 Encounter for other preprocedural examination: Secondary | ICD-10-CM

## 2021-10-15 DIAGNOSIS — Z79899 Other long term (current) drug therapy: Secondary | ICD-10-CM

## 2021-10-15 DIAGNOSIS — Z8249 Family history of ischemic heart disease and other diseases of the circulatory system: Secondary | ICD-10-CM | POA: Diagnosis not present

## 2021-10-15 HISTORY — DX: Other chronic pain: G89.29

## 2021-10-15 HISTORY — PX: APPLICATION OF INTRAOPERATIVE CT SCAN: SHX6668

## 2021-10-15 HISTORY — PX: TRANSFORAMINAL LUMBAR INTERBODY FUSION (TLIF) WITH PEDICLE SCREW FIXATION 2 LEVEL: SHX6142

## 2021-10-15 LAB — ABO/RH: ABO/RH(D): A POS

## 2021-10-15 SURGERY — TRANSFORAMINAL LUMBAR INTERBODY FUSION (TLIF) WITH PEDICLE SCREW FIXATION 2 LEVEL
Anesthesia: General | Site: Spine Lumbar

## 2021-10-15 MED ORDER — BUPIVACAINE HCL (PF) 0.5 % IJ SOLN
INTRAMUSCULAR | Status: AC
  Filled 2021-10-15: qty 30

## 2021-10-15 MED ORDER — PHENYLEPHRINE HCL-NACL 20-0.9 MG/250ML-% IV SOLN
INTRAVENOUS | Status: AC
  Filled 2021-10-15: qty 250

## 2021-10-15 MED ORDER — VANCOMYCIN HCL IN DEXTROSE 1-5 GM/200ML-% IV SOLN: 1000.0000 mg | Freq: Once | INTRAVENOUS | Status: AC

## 2021-10-15 MED ORDER — MUPIROCIN 2 % EX OINT
1.0000 | TOPICAL_OINTMENT | Freq: Two times a day (BID) | CUTANEOUS | Status: DC
  Administered 2021-10-17: 1 via TOPICAL
  Filled 2021-10-15: qty 22

## 2021-10-15 MED ORDER — ONDANSETRON HCL 4 MG PO TABS: 4.0000 mg | ORAL_TABLET | Freq: Four times a day (QID) | ORAL | Status: DC | PRN

## 2021-10-15 MED ORDER — ONDANSETRON HCL 4 MG/2ML IJ SOLN
INTRAMUSCULAR | Status: DC | PRN
  Administered 2021-10-15: 4 mg via INTRAVENOUS

## 2021-10-15 MED ORDER — BUPIVACAINE LIPOSOME 1.3 % IJ SUSP
INTRAMUSCULAR | Status: AC
  Filled 2021-10-15: qty 20

## 2021-10-15 MED ORDER — LACTATED RINGERS IV SOLN: INTRAVENOUS | Status: DC

## 2021-10-15 MED ORDER — CHLORHEXIDINE GLUCONATE 0.12 % MT SOLN
OROMUCOSAL | Status: AC
  Administered 2021-10-15: 15 mL via OROMUCOSAL
  Filled 2021-10-15: qty 15

## 2021-10-15 MED ORDER — METHOCARBAMOL 1000 MG/10ML IJ SOLN
500.0000 mg | Freq: Four times a day (QID) | INTRAVENOUS | Status: DC | PRN
  Administered 2021-10-15: 500 mg via INTRAVENOUS
  Filled 2021-10-15: qty 500

## 2021-10-15 MED ORDER — SODIUM CHLORIDE FLUSH 0.9 % IV SOLN
INTRAVENOUS | Status: AC
  Filled 2021-10-15: qty 20

## 2021-10-15 MED ORDER — SIMVASTATIN 20 MG PO TABS
40.0000 mg | ORAL_TABLET | Freq: Every day | ORAL | Status: DC
  Administered 2021-10-15 – 2021-10-16 (×2): 40 mg via ORAL
  Filled 2021-10-15 (×2): qty 2

## 2021-10-15 MED ORDER — KETAMINE HCL 50 MG/5ML IJ SOSY
PREFILLED_SYRINGE | INTRAMUSCULAR | Status: AC
  Filled 2021-10-15: qty 5

## 2021-10-15 MED ORDER — CEFAZOLIN SODIUM-DEXTROSE 2-4 GM/100ML-% IV SOLN
INTRAVENOUS | Status: AC
  Filled 2021-10-15: qty 100

## 2021-10-15 MED ORDER — ACETAMINOPHEN 10 MG/ML IV SOLN
INTRAVENOUS | Status: AC
  Filled 2021-10-15: qty 100

## 2021-10-15 MED ORDER — PROPOFOL 10 MG/ML IV BOLUS
INTRAVENOUS | Status: AC
  Filled 2021-10-15: qty 20

## 2021-10-15 MED ORDER — FENTANYL CITRATE (PF) 100 MCG/2ML IJ SOLN
25.0000 ug | INTRAMUSCULAR | Status: DC | PRN
  Administered 2021-10-15: 50 ug via INTRAVENOUS
  Administered 2021-10-15 (×2): 25 ug via INTRAVENOUS

## 2021-10-15 MED ORDER — METHOCARBAMOL 500 MG PO TABS
500.0000 mg | ORAL_TABLET | Freq: Four times a day (QID) | ORAL | Status: DC | PRN
  Administered 2021-10-16 – 2021-10-17 (×3): 500 mg via ORAL
  Filled 2021-10-15 (×4): qty 1

## 2021-10-15 MED ORDER — FENTANYL CITRATE (PF) 250 MCG/5ML IJ SOLN
INTRAMUSCULAR | Status: AC
  Filled 2021-10-15: qty 5

## 2021-10-15 MED ORDER — KETOROLAC TROMETHAMINE 15 MG/ML IJ SOLN
15.0000 mg | Freq: Four times a day (QID) | INTRAMUSCULAR | Status: DC
  Administered 2021-10-15 – 2021-10-16 (×3): 15 mg via INTRAVENOUS
  Filled 2021-10-15 (×2): qty 1

## 2021-10-15 MED ORDER — VANCOMYCIN HCL 1000 MG IV SOLR
INTRAVENOUS | Status: AC
  Filled 2021-10-15: qty 20

## 2021-10-15 MED ORDER — OXYCODONE HCL 5 MG PO TABS
10.0000 mg | ORAL_TABLET | ORAL | Status: DC | PRN
  Administered 2021-10-16 – 2021-10-17 (×2): 10 mg via ORAL
  Filled 2021-10-15 (×2): qty 2

## 2021-10-15 MED ORDER — LIDOCAINE HCL (CARDIAC) PF 100 MG/5ML IV SOSY
PREFILLED_SYRINGE | INTRAVENOUS | Status: DC | PRN
  Administered 2021-10-15: 60 mg via INTRAVENOUS

## 2021-10-15 MED ORDER — CHLORHEXIDINE GLUCONATE 0.12 % MT SOLN: 15.0000 mL | Freq: Once | OROMUCOSAL | Status: AC

## 2021-10-15 MED ORDER — SODIUM CHLORIDE 0.9 % IV SOLN: 250.0000 mL | INTRAVENOUS | Status: DC

## 2021-10-15 MED ORDER — ACETAMINOPHEN 10 MG/ML IV SOLN
INTRAVENOUS | Status: DC | PRN
  Administered 2021-10-15: 1000 mg via INTRAVENOUS

## 2021-10-15 MED ORDER — DEXMEDETOMIDINE HCL IN NACL 200 MCG/50ML IV SOLN
INTRAVENOUS | Status: DC | PRN
  Administered 2021-10-15: 8 ug via INTRAVENOUS
  Administered 2021-10-15: 12 ug via INTRAVENOUS

## 2021-10-15 MED ORDER — DEXAMETHASONE SODIUM PHOSPHATE 10 MG/ML IJ SOLN
INTRAMUSCULAR | Status: DC | PRN
  Administered 2021-10-15: 10 mg via INTRAVENOUS

## 2021-10-15 MED ORDER — ONDANSETRON HCL 4 MG/2ML IJ SOLN: 4.0000 mg | Freq: Once | INTRAMUSCULAR | Status: DC | PRN

## 2021-10-15 MED ORDER — CEFAZOLIN SODIUM-DEXTROSE 2-4 GM/100ML-% IV SOLN
2.0000 g | Freq: Once | INTRAVENOUS | Status: AC
  Administered 2021-10-15: 2 g via INTRAVENOUS

## 2021-10-15 MED ORDER — FENTANYL CITRATE (PF) 100 MCG/2ML IJ SOLN
INTRAMUSCULAR | Status: AC
  Filled 2021-10-15: qty 2

## 2021-10-15 MED ORDER — MENTHOL 3 MG MT LOZG: 1.0000 | LOZENGE | OROMUCOSAL | Status: DC | PRN

## 2021-10-15 MED ORDER — SENNA 8.6 MG PO TABS
1.0000 | ORAL_TABLET | Freq: Two times a day (BID) | ORAL | Status: DC
  Administered 2021-10-15 – 2021-10-16 (×3): 8.6 mg via ORAL
  Filled 2021-10-15 (×4): qty 1

## 2021-10-15 MED ORDER — LOSARTAN POTASSIUM 50 MG PO TABS
50.0000 mg | ORAL_TABLET | Freq: Every day | ORAL | Status: DC
  Administered 2021-10-16 – 2021-10-17 (×2): 50 mg via ORAL
  Filled 2021-10-15 (×2): qty 1

## 2021-10-15 MED ORDER — HYDROCHLOROTHIAZIDE 25 MG PO TABS
25.0000 mg | ORAL_TABLET | Freq: Every day | ORAL | Status: DC
  Administered 2021-10-16 – 2021-10-17 (×2): 25 mg via ORAL
  Filled 2021-10-15 (×2): qty 1

## 2021-10-15 MED ORDER — PROPOFOL 10 MG/ML IV BOLUS
INTRAVENOUS | Status: DC | PRN
  Administered 2021-10-15: 150 mg via INTRAVENOUS
  Administered 2021-10-15 (×2): 50 mg via INTRAVENOUS

## 2021-10-15 MED ORDER — OXYCODONE HCL 5 MG PO TABS
5.0000 mg | ORAL_TABLET | ORAL | Status: DC | PRN
  Administered 2021-10-15 – 2021-10-16 (×2): 5 mg via ORAL
  Filled 2021-10-15 (×3): qty 1

## 2021-10-15 MED ORDER — MIDAZOLAM HCL 2 MG/2ML IJ SOLN
INTRAMUSCULAR | Status: DC | PRN
  Administered 2021-10-15: 2 mg via INTRAVENOUS

## 2021-10-15 MED ORDER — ACETAMINOPHEN 325 MG PO TABS: 650.0000 mg | ORAL_TABLET | ORAL | Status: DC | PRN

## 2021-10-15 MED ORDER — KETAMINE HCL 10 MG/ML IJ SOLN
INTRAMUSCULAR | Status: DC | PRN
  Administered 2021-10-15: 30 mg via INTRAVENOUS
  Administered 2021-10-15: 20 mg via INTRAVENOUS

## 2021-10-15 MED ORDER — BUPIVACAINE-EPINEPHRINE (PF) 0.5% -1:200000 IJ SOLN
INTRAMUSCULAR | Status: AC
  Filled 2021-10-15: qty 30

## 2021-10-15 MED ORDER — ONDANSETRON HCL 4 MG/2ML IJ SOLN
INTRAMUSCULAR | Status: AC
  Filled 2021-10-15: qty 2

## 2021-10-15 MED ORDER — GABAPENTIN 100 MG PO CAPS
100.0000 mg | ORAL_CAPSULE | Freq: Every day | ORAL | Status: DC
  Administered 2021-10-15 – 2021-10-16 (×2): 100 mg via ORAL
  Filled 2021-10-15 (×2): qty 1

## 2021-10-15 MED ORDER — ONDANSETRON HCL 4 MG/2ML IJ SOLN: 4.0000 mg | Freq: Four times a day (QID) | INTRAMUSCULAR | Status: DC | PRN

## 2021-10-15 MED ORDER — FAMOTIDINE 20 MG PO TABS
ORAL_TABLET | ORAL | Status: AC
  Administered 2021-10-15: 20 mg via ORAL
  Filled 2021-10-15: qty 1

## 2021-10-15 MED ORDER — SURGIFLO WITH THROMBIN (HEMOSTATIC MATRIX KIT) OPTIME
TOPICAL | Status: DC | PRN
  Administered 2021-10-15: 1 via TOPICAL

## 2021-10-15 MED ORDER — ACETAMINOPHEN 650 MG RE SUPP: 650.0000 mg | RECTAL | Status: DC | PRN

## 2021-10-15 MED ORDER — 0.9 % SODIUM CHLORIDE (POUR BTL) OPTIME
TOPICAL | Status: DC | PRN
  Administered 2021-10-15: 500 mL

## 2021-10-15 MED ORDER — MIDAZOLAM HCL 2 MG/2ML IJ SOLN
INTRAMUSCULAR | Status: AC
  Filled 2021-10-15: qty 2

## 2021-10-15 MED ORDER — VANCOMYCIN HCL IN DEXTROSE 1-5 GM/200ML-% IV SOLN
INTRAVENOUS | Status: AC
  Administered 2021-10-15: 1000 mg via INTRAVENOUS
  Filled 2021-10-15: qty 200

## 2021-10-15 MED ORDER — FENTANYL CITRATE (PF) 100 MCG/2ML IJ SOLN
INTRAMUSCULAR | Status: DC | PRN
  Administered 2021-10-15: 100 ug via INTRAVENOUS
  Administered 2021-10-15 (×3): 50 ug via INTRAVENOUS

## 2021-10-15 MED ORDER — KETOROLAC TROMETHAMINE 15 MG/ML IJ SOLN
INTRAMUSCULAR | Status: AC
  Filled 2021-10-15: qty 1

## 2021-10-15 MED ORDER — BUPIVACAINE-EPINEPHRINE (PF) 0.5% -1:200000 IJ SOLN
INTRAMUSCULAR | Status: DC | PRN
  Administered 2021-10-15: 10 mL

## 2021-10-15 MED ORDER — OXYCODONE HCL 5 MG PO TABS: 5.0000 mg | ORAL_TABLET | Freq: Once | ORAL | Status: DC | PRN

## 2021-10-15 MED ORDER — HYDROMORPHONE HCL 1 MG/ML IJ SOLN: 0.5000 mg | INTRAMUSCULAR | Status: DC | PRN

## 2021-10-15 MED ORDER — FAMOTIDINE 20 MG PO TABS: 20.0000 mg | ORAL_TABLET | Freq: Once | ORAL | Status: AC

## 2021-10-15 MED ORDER — ENOXAPARIN SODIUM 40 MG/0.4ML IJ SOSY
40.0000 mg | PREFILLED_SYRINGE | INTRAMUSCULAR | Status: DC
  Administered 2021-10-16 – 2021-10-17 (×2): 40 mg via SUBCUTANEOUS
  Filled 2021-10-15 (×2): qty 0.4

## 2021-10-15 MED ORDER — SURGIRINSE WOUND IRRIGATION SYSTEM - OPTIME
TOPICAL | Status: DC | PRN
  Administered 2021-10-15: 450 mL

## 2021-10-15 MED ORDER — ACETAMINOPHEN 10 MG/ML IV SOLN: 1000.0000 mg | Freq: Once | INTRAVENOUS | Status: DC | PRN

## 2021-10-15 MED ORDER — ORAL CARE MOUTH RINSE: 15.0000 mL | Freq: Once | OROMUCOSAL | Status: AC

## 2021-10-15 MED ORDER — SODIUM CHLORIDE 0.9 % IV SOLN: INTRAVENOUS | Status: DC

## 2021-10-15 MED ORDER — VANCOMYCIN HCL 1000 MG IV SOLR
INTRAVENOUS | Status: DC | PRN
  Administered 2021-10-15: 1000 mg via TOPICAL

## 2021-10-15 MED ORDER — PHENOL 1.4 % MT LIQD: 1.0000 | OROMUCOSAL | Status: DC | PRN

## 2021-10-15 MED ORDER — SODIUM CHLORIDE (PF) 0.9 % IJ SOLN
INTRAMUSCULAR | Status: DC | PRN
  Administered 2021-10-15: 60 mL via INTRAMUSCULAR

## 2021-10-15 MED ORDER — SODIUM CHLORIDE 0.9% FLUSH: 3.0000 mL | INTRAVENOUS | Status: DC | PRN

## 2021-10-15 MED ORDER — SUCCINYLCHOLINE CHLORIDE 200 MG/10ML IV SOSY
PREFILLED_SYRINGE | INTRAVENOUS | Status: DC | PRN
  Administered 2021-10-15: 120 mg via INTRAVENOUS

## 2021-10-15 MED ORDER — PHENYLEPHRINE HCL-NACL 20-0.9 MG/250ML-% IV SOLN
INTRAVENOUS | Status: DC | PRN
  Administered 2021-10-15: 40 ug/min via INTRAVENOUS

## 2021-10-15 MED ORDER — OXYCODONE HCL 5 MG/5ML PO SOLN: 5.0000 mg | Freq: Once | ORAL | Status: DC | PRN

## 2021-10-15 MED ORDER — SODIUM CHLORIDE 0.9% FLUSH
3.0000 mL | Freq: Two times a day (BID) | INTRAVENOUS | Status: DC
  Administered 2021-10-15: 3 mL via INTRAVENOUS

## 2021-10-15 MED ORDER — PHENYLEPHRINE 80 MCG/ML (10ML) SYRINGE FOR IV PUSH (FOR BLOOD PRESSURE SUPPORT)
PREFILLED_SYRINGE | INTRAVENOUS | Status: DC | PRN
  Administered 2021-10-15 (×4): 80 ug via INTRAVENOUS

## 2021-10-15 SURGICAL SUPPLY — 83 items
ALLOGRAFT BONE FIBER KORE 10CC (Bone Implant) IMPLANT
BASIN KIT SINGLE STR (MISCELLANEOUS) ×1 IMPLANT
BLADE BOVIE TIP EXT 4 (BLADE) IMPLANT
BUR NEURO DRILL SOFT 3.0X3.8M (BURR) ×1 IMPLANT
CAP LOCKING THREADED (Cap) IMPLANT
CHLORAPREP W/TINT 26 (MISCELLANEOUS) ×3 IMPLANT
CNTNR SPEC 2.5X3XGRAD LEK (MISCELLANEOUS) ×1
CONT SPEC 4OZ STER OR WHT (MISCELLANEOUS) ×1
CONTAINER SPEC 2.5X3XGRAD LEK (MISCELLANEOUS) ×1 IMPLANT
COUNTER NEEDLE 20/40 LG (NEEDLE) ×2 IMPLANT
CUP MEDICINE 2OZ PLAST GRAD ST (MISCELLANEOUS) ×3 IMPLANT
DERMABOND ADVANCED .7 DNX12 (GAUZE/BANDAGES/DRESSINGS) ×1 IMPLANT
DRAPE 3D C-ARM OEC (DRAPES) IMPLANT
DRAPE C ARM PK CFD 31 SPINE (DRAPES) IMPLANT
DRAPE C-ARMOR (DRAPES) IMPLANT
DRAPE INCISE IOBAN 66X45 STRL (DRAPES) ×1 IMPLANT
DRAPE LAPAROTOMY 100X77 ABD (DRAPES) ×1 IMPLANT
DRAPE MICROSCOPE SPINE 48X150 (DRAPES) IMPLANT
DRAPE SCAN PATIENT (DRAPES) ×1 IMPLANT
DRAPE SURG 17X11 SM STRL (DRAPES) ×2 IMPLANT
DRSG OPSITE POSTOP 4X8 (GAUZE/BANDAGES/DRESSINGS) IMPLANT
DRSG TEGADERM 4X4.75 (GAUZE/BANDAGES/DRESSINGS) IMPLANT
ELECT CAUTERY BLADE TIP 2.5 (TIP) ×2
ELECT EZSTD 165MM 6.5IN (MISCELLANEOUS)
ELECT REM PT RETURN 9FT ADLT (ELECTROSURGICAL) ×1
ELECTRODE CAUTERY BLDE TIP 2.5 (TIP) ×2 IMPLANT
ELECTRODE EZSTD 165MM 6.5IN (MISCELLANEOUS) IMPLANT
ELECTRODE REM PT RTRN 9FT ADLT (ELECTROSURGICAL) ×1 IMPLANT
EX-PIN ORTHOLOCK NAV 4X150 (PIN) IMPLANT
FEE INTRAOP CADWELL SUPPLY NCS (MISCELLANEOUS) IMPLANT
FEE INTRAOP MONITOR IMPULS NCS (MISCELLANEOUS) IMPLANT
GAUZE 4X4 16PLY ~~LOC~~+RFID DBL (SPONGE) ×1 IMPLANT
GLOVE BIOGEL PI IND STRL 6.5 (GLOVE) ×2 IMPLANT
GLOVE SURG SYN 6.5 ES PF (GLOVE) ×2 IMPLANT
GLOVE SURG SYN 6.5 PF PI (GLOVE) ×2 IMPLANT
GLOVE SURG SYN 8.5  E (GLOVE) ×4
GLOVE SURG SYN 8.5 E (GLOVE) ×4 IMPLANT
GLOVE SURG SYN 8.5 PF PI (GLOVE) ×4 IMPLANT
GOWN SRG LRG LVL 4 IMPRV REINF (GOWNS) ×3 IMPLANT
GOWN SRG XL LVL 3 NONREINFORCE (GOWNS) ×1 IMPLANT
GOWN STRL NON-REIN TWL XL LVL3 (GOWNS) ×1
GOWN STRL REIN LRG LVL4 (GOWNS) ×3
GRADUATE 1200CC STRL 31836 (MISCELLANEOUS) ×2 IMPLANT
HEMOVAC 400CC 10FR (MISCELLANEOUS) IMPLANT
HOLDER FOLEY CATH W/STRAP (MISCELLANEOUS) ×1 IMPLANT
INTERBODY SABLE 10X26 7-14 15D (Miscellaneous) IMPLANT
INTRAOP CADWELL SUPPLY FEE NCS (MISCELLANEOUS)
INTRAOP DISP SUPPLY FEE NCS (MISCELLANEOUS)
INTRAOP MONITOR FEE IMPULS NCS (MISCELLANEOUS)
INTRAOP MONITOR FEE IMPULSE (MISCELLANEOUS)
KIT PREVENA INCISION MGT 13 (CANNISTER) IMPLANT
KIT SPINAL PRONEVIEW (KITS) ×1 IMPLANT
KNIFE BAYONET SHORT DISCETOMY (MISCELLANEOUS) IMPLANT
MANIFOLD NEPTUNE II (INSTRUMENTS) ×1 IMPLANT
MARKER SKIN DUAL TIP RULER LAB (MISCELLANEOUS) ×3 IMPLANT
MARKER SPHERE PSV REFLC 13MM (MARKER) ×7 IMPLANT
NDL SAFETY ECLIP 18X1.5 (MISCELLANEOUS) ×1 IMPLANT
NS IRRIG 1000ML POUR BTL (IV SOLUTION) ×1 IMPLANT
PACK LAMINECTOMY NEURO (CUSTOM PROCEDURE TRAY) ×1 IMPLANT
PENCIL ELECTRO HAND CTR (MISCELLANEOUS) IMPLANT
ROD 70MM SPINAL (Rod) IMPLANT
SCREW CREO SPINAL 6.5X45 (Screw) IMPLANT
SCREW CREO SPINAL 7.5X45 (Screw) IMPLANT
SOLUTION IRRIG SURGIPHOR (IV SOLUTION) ×2 IMPLANT
SPONGE GAUZE 2X2 8PLY STRL LF (GAUZE/BANDAGES/DRESSINGS) ×1 IMPLANT
STAPLER SKIN PROX 35W (STAPLE) IMPLANT
SURGIFLO W/THROMBIN 8M KIT (HEMOSTASIS) ×1 IMPLANT
SUT DVC VLOC 3-0 CL 6 P-12 (SUTURE) ×1 IMPLANT
SUT ETHILON 3-0 FS-10 30 BLK (SUTURE) ×1
SUT VIC AB 0 CT1 27 (SUTURE) ×2
SUT VIC AB 0 CT1 27XCR 8 STRN (SUTURE) ×1 IMPLANT
SUT VIC AB 2-0 CT1 18 (SUTURE) ×1 IMPLANT
SUTURE EHLN 3-0 FS-10 30 BLK (SUTURE) IMPLANT
SYR 10ML LL (SYRINGE) ×1 IMPLANT
SYR 30ML LL (SYRINGE) ×2 IMPLANT
TOWEL OR 17X26 4PK STRL BLUE (TOWEL DISPOSABLE) ×4 IMPLANT
TRAP FLUID SMOKE EVACUATOR (MISCELLANEOUS) ×1 IMPLANT
TRAY FOLEY SLVR 16FR LF STAT (SET/KITS/TRAYS/PACK) IMPLANT
TROCAR INSERT W/PEDICLE NDL (TROCAR) IMPLANT
TROCAR INSERT W/PEDICLE NDLE (TROCAR)
TUBING CONNECTING 10 (TUBING) ×2 IMPLANT
WATER STERILE IRR 1000ML POUR (IV SOLUTION) ×2 IMPLANT
WATER STERILE IRR 500ML POUR (IV SOLUTION) IMPLANT

## 2021-10-15 NOTE — H&P (Signed)
History of Present Illness: 10/15/2021 Katrina Crawford continues to have back and R leg pain.  07/31/2021 Katrina Crawford is here today with a chief complaint of pain in her low back, tailbone, right buttock, and right posterior leg to her heel.  She began having pain in October 2022.  She reports nagging pain that is made worse by standing in 1 position.  Nothing really helps.  She does bend forward when she has to walk for any length of time.  That does help her pain.  She is unable to do everything she would like to do currently.   Bowel/Bladder Dysfunction: none   Conservative measures: chiropractor and acupuncture  Physical therapy: has not participated Multimodal medical therapy including regular antiinflammatories:  tylenol, meloxicam, prednisone, gabapentin Injections:  has not had any epidural steroid injections   Past Surgery:  denies   Katrina Crawford has no symptoms of cervical myelopathy.   The symptoms are causing a significant impact on the patient's life.    Review of Systems:  A 10 point review of systems is negative, except for the pertinent positives and negatives detailed in the HPI.   Past Medical History:     Past Medical History:  Diagnosis Date   Allergy 01/22/1998   Arthritis      knees, lower back   Hyperlipidemia     Hypertension     Orthodontics      invisalign      Past Surgical History:      Past Surgical History:  Procedure Laterality Date   APPENDECTOMY       COLONOSCOPY   08/2013    cleared for 5 yrs- Dr Allen Norris   COLONOSCOPY WITH PROPOFOL N/A 09/04/2018    Procedure: COLONOSCOPY WITH PROPOFOL;  Surgeon: Lucilla Lame, MD;  Location: [facility];  Service: Endoscopy;  Laterality: N/A;   JOINT REPLACEMENT   11/18/2017   REPLACEMENT TOTAL KNEE Left 09/17/2017   VAGINAL HYSTERECTOMY          Allergies: Allergies  Allergen Reactions   Codeine Nausea Only   Penicillins Rash   Sulfa Antibiotics Rash        Medications: Current Meds   Medication Sig   Calcium Carb-Cholecalciferol (CALCIUM 600+D) 600-20 MG-MCG TABS Take 2 tablets by mouth daily.   cyanocobalamin 2000 MCG tablet Take 2,000 mcg by mouth daily.   diclofenac Sodium (VOLTAREN) 1 % GEL Apply 2 g topically daily.   gabapentin (NEURONTIN) 100 MG capsule Take 100 mg by mouth at bedtime.   hydrochlorothiazide (HYDRODIURIL) 25 MG tablet Take 1 tablet (25 mg total) by mouth daily.   losartan (COZAAR) 50 MG tablet Take 1 tablet (50 mg total) by mouth daily.   simvastatin (ZOCOR) 40 MG tablet Take 1 tablet (40 mg total) by mouth daily. (Patient taking differently: Take 40 mg by mouth at bedtime.)     Social History: Social History         Tobacco Use   Smoking status: Never   Smokeless tobacco: Never  Vaping Use   Vaping Use: Never used  Substance Use Topics   Alcohol use: Yes      Alcohol/week: 18.0 standard drinks of alcohol      Types: 18 Cans of beer per week   Drug use: No      Family Medical History:      Family History  Problem Relation Age of Onset   Cancer Mother     Hypertension Father     Breast  cancer Sister 6   ADD / ADHD Sister     Cancer Sister     Cancer Sister     Cancer Brother     Cancer Brother        Physical Examination: Vitals:   10/15/21 1208  BP: (!) 141/86  Pulse: 91  Resp: 15  Temp: 98.5 F (36.9 C)  SpO2: 98%   Heart sounds normal no MRG. Chest Clear to Auscultation Bilaterally.     General:          Patient is well developed, well nourished, calm, collected, and in no apparent distress. Attention to examination is appropriate.   Psychiatric:      Patient is non-anxious.   Head:               Pupils equal, round, and reactive to light.   ENT:                Oral mucosa appears well hydrated.   Neck:               Supple.  Full range of motion.   Respiratory:     Patient is breathing without any difficulty.   Extremities:     No edema.   Vascular:         Palpable dorsal pedal pulses.   Skin:                 On exposed skin, there are no abnormal skin lesions.   NEUROLOGICAL:     Awake, alert, oriented to person, place, and time.  Speech is clear and fluent. Fund of knowledge is appropriate.    Cranial Nerves: Pupils equal round and reactive to light.  Facial tone is symmetric.  Facial sensation is symmetric. Shoulder shrug is symmetric. Tongue protrusion is midline.  There is no pronator drift.     Strength: Side Biceps Triceps Deltoid Interossei Grip Wrist Ext. Wrist Flex.  R 5 5 5 5 5 5 5   L 5 5 5 5 5 5 5     Side Iliopsoas Quads Hamstring PF DF EHL  R 5 5 5 5 5 5   L 5 5 5 5 5 5     Reflexes are 1+ and symmetric at the biceps, triceps, brachioradialis, patella and achilles.   Hoffman's is absent.  Clonus is not present.  Toes are down-going.  Bilateral upper and lower extremity sensation is intact to light touch.    No evidence of dysmetria noted.   Gait is abnormal - stooped forward.   No difficulty with tandem gait.     Medical Decision Making   Imaging: Flexion and Extension Lumbar spine 07/30/21 On my review, there is approximately 8 mm of anterolisthesis at both L4-5 and L5-S1 on extension that increases to 12 mm at L4-5 and 13 mm to L5-S1 on flexion.   MRI L spine 06/27/21 L4-L5: Broad-based disc bulge flattening ventral thecal sac. Severe bilateral facet arthropathy with bilateral facet effusions and a 10 x 10 x 13 mm intraspinal synovial cyst along the posteriorcentral aspect deforming the thecal sac with resultant severe spinal stenosis. Mild left foraminal narrowing. No right foraminal narrowing.   L5-S1: Mild broad-based disc bulge. Severe bilateral facet arthropathy with bilateral facet effusions. Bilateral subarticular recess stenosis. Mild bilateral foraminal stenosis.   IMPRESSION: 1. At L4-5 there is a broad-based disc bulge flattening ventral thecal sac. Severe bilateral facet arthropathy with bilateral facet effusions and a 10 x 10 x 13 mm  intraspinal  synovial cyst along the posteriorcentral aspect deforming the thecal sac with resultant severe spinal stenosis. Mild left foraminal narrowing. No right foraminal narrowing. 2. At L5-S1 there is a mild broad-based disc bulge. Severe bilateral facet arthropathy with bilateral facet effusions. Bilateral subarticular recess stenosis. Mild bilateral foraminal stenosis.     Electronically Signed   By: Elige Ko M.D.   On: 06/28/2021 16:02   Please note that there is extensive facet arthrosis at both L4-5 and L5-S1.  There is minimal anterolisthesis of L4-5 and L5-S1 on the supine MRI images.   I have personally reviewed the images and agree with the above interpretation.   Assessment and Plan: Ms. Cislo is a pleasant 66 y.o. female with anterolisthesis at L4-5 and L5-S1 due to facet arthrosis at L4-5 and L5-S1.  She has 2 to 3 mm of anterolisthesis at each level on supine films that extends to 12 to 13 mm on extension weightbearing films.  This implies lumbar spinal instability.  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  She has back pain with right-sided radicular pain.   Due to the magnitude of instability in her lumbar spine, I think the most appropriate way forward is an L4-S1 transforaminal lumbar interbody fusion with decompression at the L4-5 and L5-S1 level and removal of her synovial cyst.  Her extradural synovial cyst causing substantial mass effect on the lumbar nerve roots, and warrants resection.  This will be performed at the same time as the stabilization procedure.    Dario Yono K. Myer Haff MD, [facility] Neurosurgery

## 2021-10-15 NOTE — Anesthesia Postprocedure Evaluation (Signed)
Anesthesia Post Note  Patient: Katrina Crawford  Procedure(s) Performed: OPEN L4-S1 TRANSFORAMINAL LUMBAR INTERBODY FUSION (TLIF) (Spine Lumbar) APPLICATION OF INTRAOPERATIVE CT SCAN (Spine Lumbar)  Patient location during evaluation: PACU Anesthesia Type: General Level of consciousness: awake and alert Pain management: pain level controlled Vital Signs Assessment: post-procedure vital signs reviewed and stable Respiratory status: spontaneous breathing, nonlabored ventilation and respiratory function stable Cardiovascular status: blood pressure returned to baseline and stable Postop Assessment: no apparent nausea or vomiting Anesthetic complications: no   No notable events documented.   Last Vitals:  Vitals:   10/15/21 1900 10/15/21 1915  BP: 110/70 116/76  Pulse: 79 74  Resp: 13 13  Temp:    SpO2: 97% 99%    Last Pain:  Vitals:   10/15/21 1915  TempSrc:   PainSc: Asleep                 Iran Ouch

## 2021-10-15 NOTE — Op Note (Signed)
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   Preoperative Note:   Risks of surgery discussed include: infection, bleeding, stroke, coma, death, paralysis, CSF leak, nerve/spinal cord injury, numbness, tingling, weakness, complex regional pain syndrome, recurrent stenosis and/or disc herniation, vascular injury, development of instability, neck/back pain, need for further surgery, persistent symptoms, development of deformity, and the risks of anesthesia. The patient understood these risks and agreed to proceed.  Operative Note:  1. Transforaminal Lumbar Interbody Fusion L4/5 and L5/S1 2. Posterolateral arthrodesis L4 to S1 3. Posterior segmental instrumentation L4 to S1 4. Lumbar decompression including central decompression, bilateral medial facetectomies, and bilateral foraminotomies at L4/5 5. Harvesting of autograft via the same incision 6. Placement of a biomechanical device (Globus Sable) at L4/5 and L5/S1 for anterior arthrodesis 7. Use of stereotaxis    The patient was brought to the Operating Room, intubated and turned into the prone position. All pressure points were checked and double checked. Flouroscopy was used to mark the incision. The patient was  prepped and draped in the standard fashion. A full timeout was performed. Preoperative antibiotics were given. The incision was injected with local anesthetic.  The incision was opened with a scalpel, then the soft tissues divided with the Bovie. Self-retaining retractors were placed. The paraspinus muscles were reflected laterally in subperiosteal fashion until the transverse processes were visible.   The stereotactic array was placed at S1.  The GE C arm was used to acquire stereotactic images which were registered to the [place] system.  We then utilized stereotactic drill guide to cannulate the pedicles from L4-S1 bilaterally.  Each tract was palpated with the ball-tipped probe.  6.5 x 45 mm screws were placed at L4 and L5.  7.5 x 45 mm screws were placed at S1.  After placement of pedicle screws, a screw-to-screw distractor was placed to distract the L4/5 disc space. We then turned attention to performing the transforaminal decompression and interbody fusion. The right L4/5 facet was removed with osteotomes and the drill, and handed off for preparation as autograft. The traversing and exiting nerve roots on the right were identified and protected. The disc was opened using a scalpel. After incising the disc space, we took a combination of pituitary rongeurs, Kerrison rongeurs, disc scrapers, and curettes to remove a majority of the disc material.  We prepared the end plates for accepting the interbody fusion.  We removed the cartilaginous plate, preserved the cortical endplate if possible during this procedure.  We serially dilated up in order to increase the size of the disc space, while protecting the traversing and exiting nerve roots, until we had sized up to a trial.    The trial was removed, and the disc space packed with autograft and allograft. The Globus Sable TLIF biomechanical device was inserted, then backfilled with a mixture of allograft and autograft, with care taken to protect the nerve  roots and thecal  sac. After placement of the device, the screw-screw distractor was removed.  The screw-to-screw distractor was then placed to distract the L5-S1 disc space. We then turned attention to performing the transforaminal decompression and interbody fusion. The right  L 5/S1 facet was removed with osteotomes and the drill, and handed off for preparation as autograft. The traversing and exiting nerve roots on the right were identified and protected. The disc was opened using a scalpel. After incising the disc space, we took a combination of pituitary rongeurs, Kerrison rongeurs, disc scrapers, and curettes to remove a majority of the disc material.  We prepared the end plates for accepting the interbody fusion.  We removed the cartilaginous plate, preserved the cortical endplate if possible during this procedure.  We serially dilated up in order to increase the size of the disc space, while protecting the traversing and exiting nerve roots, until we had sized up to a trial.    The trial was removed, and the disc space packed with autograft and allograft. The globus Sable TLIF biomechanical device was inserted, then backfilled with a mixture of allograft and autograft, with care taken to protect the nerve roots and thecal sac. After placement of the device, the screw-screw distractor was removed.  After placement of the biomechanical device, additional compression of the neural elements was noted. To decompress the neural elements at L4/5, the drill was used to extend the laminoforaminotomy laterally until the traversing and exiting nerve roots were fully decompressed.  Additionally, the drill was used to removed the base of the spinous process and the contralateral lamina until the contralateral medial facet was identified and palpated. Using a [facility] and curettes, the ligamentum flavum at L4/5 and extending below the L5 pedicles was dissected completely free of the dura. Using punches and  curettes, the ligamentum flavum was removed in its entirety at L4/5 for a complete decompression from facet to facet.  The posterior cyst noted on MRI was removed in entirety until the thecal sac was no longer compressed.   Rods were measured to length, cut, and shaped. The rods were secured using locking caps to manufacturer's specifications.  The C-arm was used to take a confirmatory CT scan to ensure adequate placement of implants.  The wound was copiously irrigated, then the external surfaces of the remaining lamina, facet, and transverse processes from L4 to S1 were decorticated. A mixture of allograft and autograft was placed over the decorticated surfaces for arthrodesis.  A drain was placed subfascially.   After hemostasis, the wound was closed in layers with 0 and 2-0 vicryl. 3-0 monocryl and a wound vac  were applied to the incision.  The patient was then flipped supine and extubated with incident. All counts were correct times 2 at the end of the case. No immediate complications were noted.  Drake Leach PA assisted in the entire procedure. An assistant was required for this procedure due to the complexity.  The assistant provided assistance in tissue manipulation and suction, and was required for the successful and safe performance of the procedure. I performed the critical portions of the procedure.   Venetia Night MD

## 2021-10-15 NOTE — Anesthesia Preprocedure Evaluation (Addendum)
Anesthesia Evaluation  Patient identified by MRN, date of birth, ID band Patient awake    Reviewed: Allergy & Precautions, NPO status , Patient's Chart, lab work & pertinent test results  History of Anesthesia Complications Negative for: history of anesthetic complications  Airway Mallampati: I   Neck ROM: Full    Dental  (+) Implants   Pulmonary neg pulmonary ROS,    Pulmonary exam normal breath sounds clear to auscultation       Cardiovascular hypertension, Normal cardiovascular exam Rhythm:Regular Rate:Normal  ECG 10/09/21:  Normal sinus rhythm Left axis deviation Septal infarct (cited on or before 22-Oct-2010) Inferior infarct , age undetermined   Neuro/Psych negative neurological ROS     GI/Hepatic negative GI ROS,   Endo/Other  negative endocrine ROSObesity   Renal/GU negative Renal ROS     Musculoskeletal  (+) Arthritis ,   Abdominal   Peds  Hematology negative hematology ROS (+)   Anesthesia Other Findings   Reproductive/Obstetrics                            Anesthesia Physical Anesthesia Plan  ASA: 2  Anesthesia Plan: General   Post-op Pain Management:    Induction: Intravenous  PONV Risk Score and Plan: 3 and Ondansetron, Dexamethasone and Treatment may vary due to age or medical condition  Airway Management Planned: Oral ETT  Additional Equipment:   Intra-op Plan:   Post-operative Plan: Extubation in OR  Informed Consent: I have reviewed the patients History and Physical, chart, labs and discussed the procedure including the risks, benefits and alternatives for the proposed anesthesia with the patient or authorized representative who has indicated his/her understanding and acceptance.     Dental advisory given  Plan Discussed with: CRNA  Anesthesia Plan Comments: (Patient consented for risks of anesthesia including but not limited to:  - adverse reactions  to medications - damage to eyes, teeth, lips or other oral mucosa - nerve damage due to positioning  - sore throat or hoarseness - damage to heart, brain, nerves, lungs, other parts of body or loss of life  Informed patient about role of CRNA in peri- and intra-operative care.  Patient voiced understanding.)        Anesthesia Quick Evaluation

## 2021-10-15 NOTE — Transfer of Care (Signed)
Immediate Anesthesia Transfer of Care Note  Patient: Katrina Crawford  Procedure(s) Performed: OPEN L4-S1 TRANSFORAMINAL LUMBAR INTERBODY FUSION (TLIF) (Spine Lumbar) APPLICATION OF INTRAOPERATIVE CT SCAN (Spine Lumbar)  Patient Location: PACU  Anesthesia Type:General  Level of Consciousness: drowsy  Airway & Oxygen Therapy: Patient Spontanous Breathing and Patient connected to face mask oxygen  Post-op Assessment: Report given to RN  Post vital signs: stable  Last Vitals:  Vitals Value Taken Time  BP    Temp    Pulse 79 10/15/21 1842  Resp 15 10/15/21 1842  SpO2 98 % 10/15/21 1842  Vitals shown include unvalidated device data.  Last Pain:  Vitals:   10/15/21 1208  TempSrc: Temporal  PainSc: 0-No pain         Complications: No notable events documented.

## 2021-10-15 NOTE — Progress Notes (Signed)
Patient husband notified that she is doing great and going to room 157 on the first floor

## 2021-10-15 NOTE — Anesthesia Procedure Notes (Signed)
Procedure Name: Intubation Date/Time: 10/15/2021 2:37 PM  Performed by: Esaw Grandchild, CRNAPre-anesthesia Checklist: Patient identified, Emergency Drugs available, Suction available and Patient being monitored Patient Re-evaluated:Patient Re-evaluated prior to induction Oxygen Delivery Method: Circle system utilized Preoxygenation: Pre-oxygenation with 100% oxygen Induction Type: IV induction Ventilation: Mask ventilation without difficulty Laryngoscope Size: Miller and 2 Grade View: Grade I Tube type: Oral Tube size: 7.0 mm Number of attempts: 1 Airway Equipment and Method: Stylet, Oral airway, LTA kit utilized and Bite block Placement Confirmation: ETT inserted through vocal cords under direct vision, positive ETCO2 and breath sounds checked- equal and bilateral Secured at: 21 cm Tube secured with: Tape Dental Injury: Teeth and Oropharynx as per pre-operative assessment

## 2021-10-16 ENCOUNTER — Encounter: Payer: Self-pay | Admitting: Neurosurgery

## 2021-10-16 MED ORDER — CELECOXIB 200 MG PO CAPS
200.0000 mg | ORAL_CAPSULE | Freq: Two times a day (BID) | ORAL | Status: DC
  Administered 2021-10-16 – 2021-10-17 (×2): 200 mg via ORAL
  Filled 2021-10-16 (×2): qty 1

## 2021-10-16 NOTE — Plan of Care (Signed)

## 2021-10-16 NOTE — Progress Notes (Signed)
Met with the patient at the bedside She doe snot want HH She has borrowed a RW in the past and is agreeable to ada[t providing one if her ins covers, I notified [employer] She may want to do Out patient PT if Physician wants her to but she will work that out with there physician Her husband to provide Transport

## 2021-10-16 NOTE — Progress Notes (Signed)
    Attending Progress Note  History: Katrina Crawford is here for anterolisthesis and acquired spondylolisthesis with severe compression of her lumbar spine  POD1: She has expected back pain overnight  Physical Exam: Vitals:   10/16/21 0400 10/16/21 0854  BP: 139/82 121/72  Pulse: 76 84  Resp: 18 18  Temp: 98.7 F (37.1 C) 98.5 F (36.9 C)  SpO2: 98% 99%    AA Ox3 CNI  Strength:5/5 throughout BLE Sensation intact to light touch  Dressing clean dry and intact  Drain 485 Data:  Recent Labs  Lab 10/09/21 1004  NA 136  K 3.5  CL 99  CO2 28  BUN 17  CREATININE 0.59  GLUCOSE 95  CALCIUM 10.0   No results for input(s): "AST", "ALT", "ALKPHOS" in the last 168 hours.  Invalid input(s): "TBILI"   Recent Labs  Lab 10/09/21 1004  WBC 5.7  HGB 13.6  HCT 39.3  PLT 243   No results for input(s): "APTT", "INR" in the last 168 hours.       Other tests/results:   Assessment/Plan:  Katrina Crawford is doing well after open lumbar L4-S1 transforaminal lumbar interbody fusion.  She has expected postoperative pain.  - mobilize - pain control - DVT prophylaxis - PTOT - Continue drain  Meade Maw MD, [facility] Department of Neurosurgery

## 2021-10-16 NOTE — Evaluation (Signed)
Occupational Therapy Evaluation Patient Details Name: Katrina Crawford MRN: [id number] DOB: 12/22/1955 Today's Date: 10/16/2021   History of Present Illness Pt is a 66 yo female s/p L4-s1 TLIF. PMH of HTN   Clinical Impression   Pt seen for OT evaluation this date, POD#1 from the above surgery. Prior to hospital admission, pt was independent with mobility, ADL, and IADL. No falls in past 6 months. She does endorse hx of RLE/R hip pain and decreased sensation prior to surgery. Reports no RLE pain during evaluation, 3/10 at incision site.  Pt lives with spouse in a single family 2 story home with basement with 3 step to enter and no handrails with spouse able to provide 24/7 assist/support as needed for pt. Currently pt completes bed mobility with log roll technique with supervision, supervision for ADL transfers with RW and PRN VC for sequencing to maximize adherence to precautions with excellent carryover, and ambulates to the bathroom with supervision + RW. She completed toileting including transfer to [facility] over toilet and pericare with supervision. Requires PRN MIN A for LB ADL tasks in order to maintain precautions. Pt educated in back precautions with handout provided, self care skills, bed mobility and functional transfer training, AE/DME for bathing, dressing, and toileting needs, and home/routines modifications and falls prevention strategies to maximize safety and functional independence while minimizing falls risk and maintaining precautions. Pt verbalized understanding of all education/training provided. Handout provided to support recall and carry over of learned precautions/techniques for bed mobility, functional transfers, and self care skills. Pt left with PT for additional assessment. Pt will benefit from 1 additional session prior to discharge to maximize carryover.      Recommendations for follow up therapy are one component of a multi-disciplinary discharge planning process, led by the  attending physician.  Recommendations may be updated based on patient status, additional functional criteria and insurance authorization.   Follow Up Recommendations  No OT follow up    Assistance Recommended at Discharge PRN  Patient can return home with the following A little help with bathing/dressing/bathroom;Assistance with cooking/housework;Assist for transportation;Help with stairs or ramp for entrance    Functional Status Assessment  Patient has had a recent decline in their functional status and demonstrates the ability to make significant improvements in function in a reasonable and predictable amount of time.  Equipment Recommendations  None recommended by OT    Recommendations for Other Services       Precautions / Restrictions Precautions Precautions: Back;Fall Precaution Booklet Issued: Yes (comment) Restrictions Weight Bearing Restrictions: No      Mobility Bed Mobility Overal bed mobility: Modified Independent             General bed mobility comments: pt completes log roll with PRN VC for sequencing for 1st attempt, does well    Transfers Overall transfer level: Needs assistance Equipment used: Rolling walker (2 wheels) Transfers: Sit to/from Stand Sit to Stand: Supervision           General transfer comment: VC for anterior scoot prior to lift off      Balance Overall balance assessment: Mild deficits observed, not formally tested                                         ADL either performed or assessed with clinical judgement   ADL  General ADL Comments: Pt currently requires PRN MIN A for LB ADL tasks, supv for ADL transfers with RW including to [facility] over toilet, supv for standing pericare after toileting; pt reports spouse able to assist as needed     Vision         Perception     Praxis      Pertinent Vitals/Pain Pain Assessment Pain Assessment:  0-10 Pain Score: 3  Pain Location: incision Pain Descriptors / Indicators: Aching Pain Intervention(s): Limited activity within patient's tolerance, Monitored during session, Repositioned     Hand Dominance     Extremity/Trunk Assessment Upper Extremity Assessment Upper Extremity Assessment: Overall WFL for tasks assessed   Lower Extremity Assessment Lower Extremity Assessment: RLE deficits/detail RLE Deficits / Details: hx decreased sensation in R hip and hx of pain in R hip/leg prior to surgery; pt denies these issues at evaluation post-surgery   Cervical / Trunk Assessment Cervical / Trunk Assessment: Back Surgery   Communication Communication Communication: No difficulties   Cognition Arousal/Alertness: Awake/alert Behavior During Therapy: WFL for tasks assessed/performed Overall Cognitive Status: Within Functional Limits for tasks assessed                                       General Comments       Exercises Other Exercises Other Exercises: Pt educated in back precautions and how to maintain during ADL and IADL tasks, AE/DME, falls prevention, home/routines modifications, log rolling bed mobility, and positioning for sleep; handout provided   Shoulder Instructions      Home Living Family/patient expects to be discharged to:: Private residence Living Arrangements: Spouse/significant other Available Help at Discharge: Family;Available 24 hours/day Type of Home: House Home Access: Stairs to enter [employer] of Steps: 3 Entrance Stairs-Rails: None Home Layout: Multi-level;Laundry or work area in Theatre manager on main level;Bed/bath upstairs Alternate Teacher, music of Steps: full bed/bath upstairs   Bathroom Shower/Tub: Producer, television/film/video: Handicapped height     Home Equipment: Information systems manager - built in;Hand held shower head;Adaptive equipment Adaptive Equipment: Reacher;Long-handled sponge        Prior  Functioning/Environment Prior Level of Function : Driving;Independent/Modified Independent             Mobility Comments: indep ADLs Comments: indep, active, goes to gym near daily, involved in church and choir        OT Problem List: Pain;Decreased knowledge of use of DME or AE;Decreased knowledge of precautions      OT Treatment/Interventions: Self-care/ADL training;Therapeutic activities;DME and/or AE instruction;Patient/family education    OT Goals(Current goals can be found in the care plan section) Acute Rehab OT Goals Patient Stated Goal: return to PLOF with less pain OT Goal Formulation: With patient Time For Goal Achievement: 10/30/21 Potential to Achieve Goals: Good ADL Goals Pt Will Perform Lower Body Dressing: with modified independence;sit to/from stand (maintaining back precautions) Additional ADL Goal #1: Pt will independently verbalize 100% of back precautions and how to maintain during dressing, bathing, and toileting.  OT Frequency: Min 1X/week    Co-evaluation              AM-PAC OT "6 Clicks" Daily Activity     Outcome Measure Help from another person eating meals?: None Help from another person taking care of personal grooming?: None Help from another person toileting, which includes using toliet, bedpan, or urinal?: None Help from another person  bathing (including washing, rinsing, drying)?: A Little Help from another person to put on and taking off regular upper body clothing?: None Help from another person to put on and taking off regular lower body clothing?: A Little 6 Click Score: 22   End of Session Equipment Utilized During Treatment: Gait belt;Rolling walker (2 wheels)  Activity Tolerance: Patient tolerated treatment well Patient left: Other (comment) (in bathroom after toileting with PT)  OT Visit Diagnosis: Other abnormalities of gait and mobility (R26.89);Pain Pain - Right/Left:  (back incision)                Time: 8184-0375 OT  Time Calculation (min): 30 min Charges:  OT General Charges $OT Visit: 1 Visit OT Evaluation $OT Eval Low Complexity: 1 Low OT Treatments $Self Care/Home Management : 23-37 mins  Ardeth Perfect., MPH, MS, OTR/L ascom [phone] 10/16/21, 10:09 AM

## 2021-10-16 NOTE — Evaluation (Signed)
Physical Therapy Evaluation Patient Details Name: Katrina Crawford MRN: [id number] DOB: [birth year] Today's Date: 10/16/2021  History of Present Illness  Pt is a 66 yo female s/p L4-s1 TLIF. PMH of HTN  Clinical Impression  Patient with OT at start of session, referenced 3/10 pain in back, though much improved from pre surgery. She reported at baseline she is independent.  She was able to stand from [facility] over standard commode supervision after independent pericare. Able to verbalize BLTA precautions (needed assistance to recall "arching"). She ambulated ~268ft with RW and supervision. She was also able to perform stair navigation with minA/handheld assist and SPC (no railings at home). Pt verbalized understanding of technique and well as comfort with task.  Overall the patient demonstrated deficits (see "PT Problem List") that impede the patient's functional abilities, safety, and mobility and would benefit from skilled PT intervention. Recommendation at this time is home and to follow physician's recommendations for follow up therapies.        Recommendations for follow up therapy are one component of a multi-disciplinary discharge planning process, led by the attending physician.  Recommendations may be updated based on patient status, additional functional criteria and insurance authorization.  Follow Up Recommendations Follow physician's recommendations for discharge plan and follow up therapies      Assistance Recommended at Discharge Intermittent Supervision/Assistance  Patient can return home with the following  A little help with walking and/or transfers;A little help with bathing/dressing/bathroom;Assistance with cooking/housework;Assist for transportation;Help with stairs or ramp for entrance    Equipment Recommendations Rolling walker (2 wheels)  Recommendations for Other Services       Functional Status Assessment Patient has had a recent decline in their functional status and  demonstrates the ability to make significant improvements in function in a reasonable and predictable amount of time.     Precautions / Restrictions Precautions Precautions: Back;Fall Precaution Booklet Issued: Yes (comment) Restrictions Weight Bearing Restrictions: No      Mobility  Bed Mobility               General bed mobility comments: sitting with OT on BSC over standard commode    Transfers Overall transfer level: Needs assistance Equipment used: Rolling walker (2 wheels) Transfers: Sit to/from Stand Sit to Stand: Supervision                Ambulation/Gait Ambulation/Gait assistance: Supervision Gait Distance (Feet): 220 Feet Assistive device: Rolling walker (2 wheels)            Stairs Stairs: Yes Stairs assistance: Min assist Stair Management: No rails, With cane (handheld assist) Number of Stairs: 8 General stair comments: no rails, performed twice. pt able to do safely with [facility] and handheld assist, voiced understanding  Wheelchair Mobility    Modified Rankin (Stroke Patients Only)       Balance Overall balance assessment: Mild deficits observed, not formally tested                                           Pertinent Vitals/Pain Pain Assessment Pain Assessment: 0-10 Pain Score: 3  Pain Location: incision Pain Descriptors / Indicators: Aching Pain Intervention(s): Limited activity within patient's tolerance, Monitored during session, Repositioned    Home Living Family/patient expects to be discharged to:: Private residence Living Arrangements: Spouse/significant other Available Help at Discharge: Family;Available 24 hours/day Type of Home: House Home Access: Stairs to  enter Entrance Stairs-Rails: None Entrance Stairs-Number of Steps: 3 Alternate Level Stairs-Number of Steps: full bed/bath upstairs Home Layout: Multi-level;Laundry or work area in Event organiser on main level;Bed/bath [place]: [place] held shower head;Adaptive equipment      Prior Function Prior Level of Function : Driving;Independent/Modified Independent             Mobility Comments: indep ADLs Comments: indep, active, goes to gym near daily, involved in church and choir     Hand Dominance        Extremity/Trunk Assessment   Upper Extremity Assessment Upper Extremity Assessment: Defer to OT evaluation    Lower Extremity Assessment Lower Extremity Assessment: Overall WFL for tasks assessed RLE Deficits / Details: hx decreased sensation in R hip and hx of pain in R hip/leg prior to surgery; pt denies these issues at evaluation post-surgery    Cervical / Trunk Assessment Cervical / Trunk Assessment: Back Surgery  Communication   Communication: No difficulties  Cognition Arousal/Alertness: Awake/alert Behavior During Therapy: WFL for tasks assessed/performed Overall Cognitive Status: Within Functional Limits for tasks assessed                                          General Comments      Exercises     Assessment/Plan    PT Assessment Patient needs continued PT services  PT Problem List Decreased strength;Decreased mobility;Decreased activity tolerance;Decreased balance;Pain;Decreased knowledge of use of DME;Decreased knowledge of precautions       PT Treatment Interventions DME instruction;Therapeutic exercise;Gait training;Balance training;Stair training;Neuromuscular re-education;Functional mobility training;Therapeutic activities;Patient/family education    PT Goals (Current goals can be found in the Care Plan section)  Acute Rehab PT Goals Patient Stated Goal: to get back to working out PT Goal Formulation: With patient Time For Goal Achievement: 10/30/21 Potential to Achieve Goals: Good    Frequency 7X/week     Co-evaluation               AM-PAC PT "6 Clicks" Mobility  Outcome Measure Help needed turning from your  back to your side while in a flat bed without using bedrails?: None Help needed moving from lying on your back to sitting on the side of a flat bed without using bedrails?: None Help needed moving to and from a bed to a chair (including a wheelchair)?: None Help needed standing up from a chair using your arms (e.g., wheelchair or bedside chair)?: None Help needed to walk in hospital room?: None Help needed climbing 3-5 steps with a railing? : A Little 6 Click Score: 23    End of Session Equipment Utilized During Treatment: Gait belt Activity Tolerance: Patient tolerated treatment well Patient left: in chair;with call bell/phone within reach;with family/visitor present Nurse Communication: Mobility status PT Visit Diagnosis: Other abnormalities of gait and mobility (R26.89);Difficulty in walking, not elsewhere classified (R26.2);Muscle weakness (generalized) (M62.81);Pain Pain - Right/Left:  (midline) Pain - part of body:  (low back)    Time: 6283-1517 PT Time Calculation (min) (ACUTE ONLY): 20 min   Charges:   PT Evaluation $PT Eval Low Complexity: 1 Low PT Treatments $Therapeutic Activity: 8-22 mins        Lieutenant Diego PT, DPT 11:23 AM,10/16/21

## 2021-10-17 ENCOUNTER — Encounter: Payer: Self-pay | Admitting: Neurosurgery

## 2021-10-17 MED ORDER — OXYCODONE HCL 5 MG PO TABS: 5.0000 mg | ORAL_TABLET | ORAL | 0 refills | Status: AC | PRN

## 2021-10-17 MED ORDER — SENNA 8.6 MG PO TABS: 1.0000 | ORAL_TABLET | Freq: Two times a day (BID) | ORAL | 0 refills | Status: DC

## 2021-10-17 MED ORDER — METHOCARBAMOL 500 MG PO TABS: 500.0000 mg | ORAL_TABLET | Freq: Four times a day (QID) | ORAL | 0 refills | Status: DC | PRN

## 2021-10-17 MED ORDER — CELECOXIB 200 MG PO CAPS: 200.0000 mg | ORAL_CAPSULE | Freq: Two times a day (BID) | ORAL | 0 refills | Status: DC

## 2021-10-17 NOTE — Discharge Summary (Signed)
Physician Discharge Summary  Patient ID: Katrina Crawford MRN: [id number] DOB/AGE: 07-09-1955 66 y.o.  Admit date: 10/15/2021 Discharge date: 10/17/2021  Admission Diagnoses:  Principal Problem:   Spondylolisthesis of lumbar region Active Problems:   Acquired spondylolisthesis   Spinal instability, lumbar   Synovial cyst of lumbar facet joint   Chronic right-sided low back pain with right-sided sciatica  Discharge Diagnoses:  Principal Problem:   Spondylolisthesis of lumbar region Active Problems:   Acquired spondylolisthesis   Spinal instability, lumbar   Synovial cyst of lumbar facet joint   Chronic right-sided low back pain with right-sided sciatica   Discharged Condition: good  Hospital Course: Katrina Crawford presented for surgical intervention.  She did very well from her procedure and was felt to be stable for discharge on postoperative day 2.  Consults: None  Significant Diagnostic Studies: radiology: X-Ray: Correct localization during surgery and good placement of implants  Treatments: surgery: L4-S1 transforaminal lumbar interbody fusion  Discharge Exam: Blood pressure 114/65, pulse 87, temperature 98.6 F (37 C), resp. rate 16, height 5\' 8"  (1.727 m), weight 99.8 kg, SpO2 96 %. Awake and alert.  Cranial nerves intact.  5 out of 5 throughout.  Disposition: Discharge disposition: 01-Home or Self Care       Discharge Instructions     Discharge patient   Complete by: As directed    Discharge disposition: 01-Home or Self Care   Discharge patient date: 10/17/2021   Incentive spirometry RT   Complete by: As directed       Allergies as of 10/17/2021       Reactions   Codeine Nausea Only   Penicillins Rash   Sulfa Antibiotics Rash        Medication List     TAKE these medications    Calcium 600+D 600-20 MG-MCG Tabs Generic drug: Calcium Carb-Cholecalciferol Take 2 tablets by mouth daily.   celecoxib 200 MG capsule Commonly known as: CELEBREX Take 1  capsule (200 mg total) by mouth 2 (two) times daily.   cyanocobalamin 2000 MCG tablet Take 2,000 mcg by mouth daily.   gabapentin 100 MG capsule Commonly known as: NEURONTIN Take 100 mg by mouth at bedtime.   hydrochlorothiazide 25 MG tablet Commonly known as: HYDRODIURIL Take 1 tablet (25 mg total) by mouth daily.   L-Lysine 1000 MG Tabs Take 2 tablets (2,000 mg total) by mouth daily. otc   losartan 50 MG tablet Commonly known as: COZAAR Take 1 tablet (50 mg total) by mouth daily.   methocarbamol 500 MG tablet Commonly known as: ROBAXIN Take 1 tablet (500 mg total) by mouth every 6 (six) hours as needed for muscle spasms.   mupirocin ointment 2 % Commonly known as: BACTROBAN Apply 1 application topically 2 (two) times daily.   oxyCODONE 5 MG immediate release tablet Commonly known as: Oxy IR/ROXICODONE Take 1-2 tablets (5-10 mg total) by mouth every 3 (three) hours as needed for up to 7 days for moderate pain ((score 4 to 6)).   senna 8.6 MG Tabs tablet Commonly known as: SENOKOT Take 1 tablet (8.6 mg total) by mouth 2 (two) times daily.   simvastatin 40 MG tablet Commonly known as: ZOCOR Take 1 tablet (40 mg total) by mouth daily. What changed: when to take this   valACYclovir 1000 MG tablet Commonly known as: VALTREX Take 1 tablet (1,000 mg total) by mouth 2 (two) times daily. As needed for breakout   Voltaren 1 % Gel Generic drug: diclofenac Sodium Apply 2 g  topically daily.               Durable Medical Equipment  (From admission, onward)           Start     Ordered   10/16/21 1500  For home use only DME Walker rolling  Once       Question Answer Comment  Walker: With [place] Wheels   Patient needs a walker to treat with the following condition Weakness generalized      10/16/21 1459   10/16/21 1241  For home use only DME Walker rolling  Once       Question Answer Comment  Walker: With [place] Wheels   Patient needs a walker to treat with  the following condition Impaired mobility      10/16/21 1241            Follow-up Information     Geronimo Boot, PA-C Follow up on 10/30/2021.   Specialty: Neurosurgery Why: 11 am Contact information: [address] [place] [address] [phone]                 Signed: Meade Maw 10/17/2021, 1:27 PM

## 2021-10-17 NOTE — Discharge Instructions (Signed)
Your surgeon has performed an operation on your lumbar spine (low back) to relieve pressure on one or more nerves. Many times, patients feel better immediately after surgery and can "overdo it." Even if you feel well, it is important that you follow these activity guidelines. If you do not let your back heal properly from the surgery, you can increase the chance of a disc herniation and/or return of your symptoms. The following are instructions to help in your recovery once you have been discharged from the hospital.  No NSAIDs other than celebrex.  Activity    No bending, lifting, or twisting ("BLT"). Avoid lifting objects heavier than 10 pounds (gallon milk jug).  Where possible, avoid household activities that involve lifting, bending, pushing, or pulling such as laundry, vacuuming, grocery shopping, and childcare. Try to arrange for help from friends and family for these activities while your back heals.  Increase physical activity slowly as tolerated.  Taking short walks is encouraged, but avoid strenuous exercise. Do not jog, run, bicycle, lift weights, or participate in any other exercises unless specifically allowed by your doctor. Avoid prolonged sitting, including car rides.  Talk to your doctor before resuming sexual activity.  You should not drive until cleared by your doctor.  Until released by your doctor, you should not return to work or school.  You should rest at home and let your body heal.   You may shower three days after your surgery.  After showering, lightly dab your incision dry. Do not take a tub bath or go swimming for 3 weeks, or until approved by your doctor at your follow-up appointment.  If you smoke, we strongly recommend that you quit.  Smoking has been proven to interfere with normal healing in your back and will dramatically reduce the success rate of your surgery. Please contact [facility] (800-QUIT-NOW) and use the resources at www.[facility].com for assistance  in stopping smoking.  Surgical Incision   If you have a dressing on your incision, you may remove it four days after your surgery. Keep your incision area clean and dry.  If you have staples or stitches on your incision, you should have a follow up scheduled for removal. If you do not have staples or stitches, you will have steri-strips (small pieces of surgical tape) or Dermabond glue. The steri-strips/glue should begin to peel away within about a week (it is fine if the steri-strips fall off before then). If the strips are still in place one week after your surgery, you may gently remove them.  Diet            You may return to your usual diet. Be sure to stay hydrated.  When to Contact us  Although your surgery and recovery will likely be uneventful, you may have some residual numbness, aches, and pains in your back and/or legs. This is normal and should improve in the next few weeks.  However, should you experience any of the following, contact us immediately: New numbness or weakness Pain that is progressively getting worse, and is not relieved by your pain medications or rest Bleeding, redness, swelling, pain, or drainage from surgical incision Chills or flu-like symptoms Fever greater than 101.0 F (38.3 C) Problems with bowel or bladder functions Difficulty breathing or shortness of breath Warmth, tenderness, or swelling in your calf  Contact Information During office hours (Monday-Friday 9 am to 5 pm), please call your physician at [phone] and ask for Berdine Addison After hours and weekends, please call [phone]  and speak with the neurosurgeon on call For a life-threatening emergency, call 911

## 2021-10-17 NOTE — Progress Notes (Signed)
Occupational Therapy Treatment Patient Details Name: Katrina Crawford MRN: [id number] DOB: April 29, 1955 Today's Date: 10/17/2021   History of present illness Pt is a 66 yo female s/p L4-s1 TLIF. PMH of HTN   OT comments  Upon entering session, pt resting in bed and agreeable to OT. Pt completed functional transfers, functional mobility, and toileting tasks with supervision this date. Education was provided re: LB dressing AE and back precautions. Pt able to complete LB dressing using a reacher with set up A and VC for sequencing. Pt demonstrated good carryover of education by providing teach back. Pt was able to adhere to back precautions during all functional transfers and ADL tasks. Pt was motivated to participate in therapy. Pt is making progress toward goal completion. D/C recommendation remains appropriate. OT will continue to follow acutely.     Recommendations for follow up therapy are one component of a multi-disciplinary discharge planning process, led by the attending physician.  Recommendations may be updated based on patient status, additional functional criteria and insurance authorization.    Follow Up Recommendations  No OT follow up    Assistance Recommended at Discharge PRN  Patient can return home with the following  A little help with bathing/dressing/bathroom;Assistance with cooking/housework;Assist for transportation;Help with stairs or ramp for entrance   Equipment Recommendations  None recommended by OT    Recommendations for Other Services      Precautions / Restrictions Precautions Precautions: Back;Fall Restrictions Weight Bearing Restrictions: No       Mobility Bed Mobility Overal bed mobility: Modified Independent                  Transfers Overall transfer level: Needs assistance Equipment used: Rolling walker (2 wheels), None Transfers: Sit to/from Stand Sit to Stand: Supervision (4x total)           General transfer comment: STS from  EOB and BSC with supervision + RW. Pt then deferring use of RW and completed STS from EOB with no AD. Able to adhere to back precautions during all functional transfers.     Balance Overall balance assessment: Mild deficits observed, not formally tested (Pt did not require BUE support to maintain standing balance, able to complete standing grooming tasks with supervision and no AD)                                         ADL either performed or assessed with clinical judgement   ADL Overall ADL's : Needs assistance/impaired     Grooming: Wash/dry face;Oral care;Set up;Standing Grooming Details (indicate cue type and reason): sinkside grooming             Lower Body Dressing: With adaptive equipment;Set up;Sitting/lateral leans;Sit to/from stand;Cueing for compensatory techniques;Supervision/safety Lower Body Dressing Details (indicate cue type and reason): Education provided for LB dressing AE, VC for compensatory technique 2/2 pt's first time using reacher for LB dressing. Able to thread BLEs through underwear in sitting using reacher with set up A. Then able to don/doff briefs over B hips in standing with supervision. Toilet Transfer: Supervision/safety;Rolling walker (2 wheels);BSC/3in1;Regular Toilet;Cueing for sequencing;Grab bars Toilet Transfer Details (indicate cue type and reason): BSC frame placed over toilet. VC for sequencing in order to adhere to back precautions, pt very careful to keep back straight during STS from toilet and to not twist when completing posterior hygiene. Toileting- Water quality scientist and Hygiene: Sit to/from  stand;Supervision/safety Toileting - Clothing Manipulation Details (indicate cue type and reason): Pt completed posterior hygiene in standing with supervision     Functional mobility during ADLs: Supervision/safety;Rolling walker (2 wheels) (at room level with/without AD)      Extremity/Trunk Assessment Upper Extremity  Assessment Upper Extremity Assessment: Overall WFL for tasks assessed   Lower Extremity Assessment Lower Extremity Assessment: Overall WFL for tasks assessed        Vision Baseline Vision/History: 1 Wears glasses Patient Visual Report: No change from baseline     Perception     Praxis      Cognition   Behavior During Therapy: WFL for tasks assessed/performed Overall Cognitive Status: Within Functional Limits for tasks assessed                                          Exercises Other Exercises Other Exercises: OT provided education re: back precautions (no BLT), how to maintain precautions during ADL tasks, LB dressing AE, safe transfer techniques, log rolling bed mobility    Shoulder Instructions       General Comments      Pertinent Vitals/ Pain       Pain Assessment Pain Assessment: Faces Faces Pain Scale: Hurts a little bit Pain Location: incision site Pain Descriptors / Indicators: Aching, Constant Pain Intervention(s): Limited activity within patient's tolerance, Patient requesting pain meds-RN notified, Monitored during session, Repositioned  Home Living                                          Prior Functioning/Environment              Frequency  Min 1X/week        Progress Toward Goals  OT Goals(current goals can now be found in the care plan section)  Progress towards OT goals: Progressing toward goals  Acute Rehab OT Goals Patient Stated Goal: return to PLOF with less pain OT Goal Formulation: With patient Time For Goal Achievement: 10/30/21 Potential to Achieve Goals: Good  Plan Discharge plan remains appropriate;Frequency remains appropriate    Co-evaluation                 AM-PAC OT "6 Clicks" Daily Activity     Outcome Measure   Help from another person eating meals?: None Help from another person taking care of personal grooming?: None Help from another person toileting, which  includes using toliet, bedpan, or urinal?: None Help from another person bathing (including washing, rinsing, drying)?: A Little Help from another person to put on and taking off regular upper body clothing?: None Help from another person to put on and taking off regular lower body clothing?: A Little 6 Click Score: 22    End of Session Equipment Utilized During Treatment: Gait belt;Rolling walker (2 wheels)  OT Visit Diagnosis: Other abnormalities of gait and mobility (R26.89);Pain Pain - Right/Left:  (back incision)   Activity Tolerance Patient tolerated treatment well   Patient Left in chair;with call bell/phone within reach;with chair alarm set   Nurse Communication Mobility status        Time: 7902-4097 OT Time Calculation (min): 27 min  Charges: OT General Charges $OT Visit: 1 Visit OT Treatments $Self Care/Home Management : 23-37 mins  [facility] [place], OTR/L ascom [phone]  10/17/21,  9:37 AM

## 2021-10-17 NOTE — Progress Notes (Signed)
Physical Therapy Treatment Patient Details Name: Katrina Crawford MRN: [id number] DOB: Mar 17, 1955 Today's Date: 10/17/2021   History of Present Illness Pt is a 66 yo female s/p L4-s1 TLIF. PMH of HTN    PT Comments    Patient alert agreeable to PT, reported 3/10 pain and requested pain medication, RN notified. She was able to perform transfers with supervision as well as supervision with ambulation with RW. She ambulated at least 468ft, steady safe with no LOB, but not interested in attempting ambulation with SPC at this time. Returned to bed at end of session per pt request, set up in L sidelying prior to exit. The patient would benefit from further skilled PT intervention to continue to progress towards goals. Recommendation remains appropriate.        Recommendations for follow up therapy are one component of a multi-disciplinary discharge planning process, led by the attending physician.  Recommendations may be updated based on patient status, additional functional criteria and insurance authorization.  Follow Up Recommendations  Follow physician's recommendations for discharge plan and follow up therapies     Assistance Recommended at Discharge Intermittent Supervision/Assistance  Patient can return home with the following A little help with walking and/or transfers;A little help with bathing/dressing/bathroom;Assistance with cooking/housework;Assist for transportation;Help with stairs or ramp for entrance   Equipment Recommendations  Rolling walker (2 wheels)    Recommendations for Other Services       Precautions / Restrictions Precautions Precautions: Back;Fall Precaution Booklet Issued: Yes (comment) Restrictions Weight Bearing Restrictions: No     Mobility  Bed Mobility Overal bed mobility: Modified Independent             General bed mobility comments: sitting to sidelying    Transfers Overall transfer level: Needs assistance Equipment used: Rolling walker  (2 wheels) Transfers: Sit to/from Stand Sit to Stand: Supervision                Ambulation/Gait Ambulation/Gait assistance: Supervision Gait Distance (Feet): 400 Feet Assistive device: Rolling walker (2 wheels)         General Gait Details: steady, safe, no LOB   Stairs             Wheelchair Mobility    Modified Rankin (Stroke Patients Only)       Balance Overall balance assessment: Modified Independent                                          Cognition Arousal/Alertness: Awake/alert Behavior During Therapy: WFL for tasks assessed/performed Overall Cognitive Status: Within Functional Limits for tasks assessed                                          Exercises      General Comments        Pertinent Vitals/Pain Pain Assessment Pain Assessment: 0-10 Pain Score: 3  Pain Location: incision site Pain Descriptors / Indicators: Aching, Constant Pain Intervention(s): Limited activity within patient's tolerance, Monitored during session, Repositioned, RN gave pain meds during session, Patient requesting pain meds-RN notified    Home Living                          Prior Function  PT Goals (current goals can now be found in the care plan section) Progress towards PT goals: Progressing toward goals    Frequency    7X/week      PT Plan Current plan remains appropriate    Co-evaluation              AM-PAC PT "6 Clicks" Mobility   Outcome Measure  Help needed turning from your back to your side while in a flat bed without using bedrails?: None Help needed moving from lying on your back to sitting on the side of a flat bed without using bedrails?: None Help needed moving to and from a bed to a chair (including a wheelchair)?: None Help needed standing up from a chair using your arms (e.g., wheelchair or bedside chair)?: None Help needed to walk in hospital room?: None Help  needed climbing 3-5 steps with a railing? : None 6 Click Score: 24    End of Session Equipment Utilized During Treatment: Gait belt Activity Tolerance: Patient tolerated treatment well Patient left: with call bell/phone within reach;in bed Nurse Communication: Mobility status PT Visit Diagnosis: Other abnormalities of gait and mobility (R26.89);Difficulty in walking, not elsewhere classified (R26.2);Muscle weakness (generalized) (M62.81);Pain Pain - Right/Left:  (midline) Pain - part of body:  (back)     Time: 6270-3500 PT Time Calculation (min) (ACUTE ONLY): 21 min  Charges:  $Therapeutic Exercise: 8-22 mins                     Olga Coaster PT, DPT 11:07 AM,10/17/21

## 2021-10-17 NOTE — Progress Notes (Signed)
    Attending Progress Note  History: Katrina Crawford is here for anterolisthesis and acquired spondylolisthesis with severe compression of her lumbar spine  POD2: Did well yesterday.  Pain under reasonable control. POD1: She has expected back pain overnight  Physical Exam: Vitals:   10/16/21 2341 10/17/21 0322  BP: 108/65 125/77  Pulse: 92 85  Resp: 16 16  Temp: 98.2 F (36.8 C) 98 F (36.7 C)  SpO2: 95% 95%    AA Ox3 CNI  Strength:5/5 throughout BLE Sensation intact to light touch  Dressing clean dry and intact  Drain 220 Data:  No results for input(s): "NA", "K", "CL", "CO2", "BUN", "CREATININE", "LABGLOM", "GLUCOSE", "CALCIUM" in the last 168 hours.  No results for input(s): "AST", "ALT", "ALKPHOS" in the last 168 hours.  Invalid input(s): "TBILI"   No results for input(s): "WBC", "HGB", "HCT", "PLT" in the last 168 hours.  No results for input(s): "APTT", "INR" in the last 168 hours.       Other tests/results:   Assessment/Plan:  Katrina Crawford is doing well after open lumbar L4-S1 transforaminal lumbar interbody fusion.  She has expected postoperative pain.  - mobilize - pain control - DVT prophylaxis - PTOT - Continue drain - will watch output today to determine Halliday MD, [facility] Department of Neurosurgery

## 2021-10-17 NOTE — Plan of Care (Signed)

## 2021-10-22 ENCOUNTER — Encounter: Payer: Self-pay | Admitting: Neurosurgery

## 2021-10-22 NOTE — Telephone Encounter (Signed)
I agree with OTC magnesium citrate. If no results with this or OTC suppository then can do OTC enema.

## 2021-10-24 ENCOUNTER — Ambulatory Visit (INDEPENDENT_AMBULATORY_CARE_PROVIDER_SITE_OTHER): Payer: Medicare Other

## 2021-10-24 DIAGNOSIS — Z0000 Encounter for general adult medical examination without abnormal findings: Secondary | ICD-10-CM

## 2021-10-24 NOTE — Progress Notes (Signed)
Subjective:   Katrina Crawford is a 66 y.o. female who presents for Medicare Annual (Subsequent) preventive examination.  I connected with  Katrina RegesYvonne R Mudrick on 10/24/21 by a audio enabled telemedicine application and verified that I am speaking with the correct person using two identifiers.  Patient Location: Home  Provider Location: Office/Clinic  I discussed the limitations of evaluation and management by telemedicine. The patient expressed understanding and agreed to proceed.   Review of Systems    Defer to PCP Cardiac Risk Factors include: advanced age (>[id number], 83>65 women)     Objective:    Today's Vitals   10/24/21 1450  PainSc: 6    There is no height or weight on file to calculate BMI.     10/24/2021    2:51 PM 10/15/2021    7:00 PM 10/15/2021   12:14 PM 10/09/2021    9:35 AM 10/23/2020    2:57 PM 10/20/2019    3:10 PM 10/16/2019    3:01 PM  Advanced Directives  Does Patient Have a Medical Advance Directive?   Yes Yes Yes Yes No  Type of Estate agentAdvance Directive Healthcare Power of State Street Corporationttorney Healthcare Power of [place];Living will Living will;Healthcare Power of [employer] Grou[employer]torney  Healthcare Power of [place];Living will Healthcare Power of [place];Living will   Does patient want to make changes to medical advance directive?  No - Patient declined  No - Patient declined     Copy of Healthcare Power of Attorney in Chart? No - copy requested    Yes - validated most recent copy scanned in chart (See row information) No - copy requested     Current Medications (verified) Outpatient Encounter Medications as of 10/24/2021  Medication Sig   Calcium Carb-Cholecalciferol (CALCIUM 600+D) 600-20 MG-MCG TABS Take 2 tablets by mouth daily.   celecoxib (CELEBREX) 200 MG capsule Take 1 capsule (200 mg total) by mouth 2 (two) times daily.   cyanocobalamin 2000 MCG tablet Take 2,000 mcg by mouth daily.   diclofenac Sodium (VOLTAREN) 1 % GEL Apply 2 g topically daily.   gabapentin (NEURONTIN) 100 MG  capsule Take 100 mg by mouth at bedtime.   hydrochlorothiazide (HYDRODIURIL) 25 MG tablet Take 1 tablet (25 mg total) by mouth daily.   L-Lysine 1000 MG TABS Take 2 tablets (2,000 mg total) by mouth daily. otc   losartan (COZAAR) 50 MG tablet Take 1 tablet (50 mg total) by mouth daily.   methocarbamol (ROBAXIN) 500 MG tablet Take 1 tablet (500 mg total) by mouth every 6 (six) hours as needed for muscle spasms.   mupirocin ointment (BACTROBAN) 2 % Apply 1 application topically 2 (two) times daily.   oxyCODONE (OXY IR/ROXICODONE) 5 MG immediate release tablet Take 1-2 tablets (5-10 mg total) by mouth every 3 (three) hours as needed for up to 7 days for moderate pain ((score 4 to 6)).   senna (SENOKOT) 8.6 MG TABS tablet Take 1 tablet (8.6 mg total) by mouth 2 (two) times daily.   simvastatin (ZOCOR) 40 MG tablet Take 1 tablet (40 mg total) by mouth daily. (Patient taking differently: Take 40 mg by mouth at bedtime.)   valACYclovir (VALTREX) 1000 MG tablet Take 1 tablet (1,000 mg total) by mouth 2 (two) times daily. As needed for breakout   No facility-administered encounter medications on file as of 10/24/2021.    Allergies (verified) Codeine, Penicillins, and Sulfa antibiotics   History: Past Medical History:  Diagnosis Date   Allergy 01/22/1998   Arthritis    knees,  lower back   Chronic right-sided low back pain with right-sided sciatica    Hyperlipidemia    Hypertension    Orthodontics    invisalign   Past Surgical History:  Procedure Laterality Date   APPENDECTOMY     APPLICATION OF INTRAOPERATIVE CT SCAN N/A 10/15/2021   Procedure: APPLICATION OF INTRAOPERATIVE CT SCAN;  Surgeon: Venetia Night, MD;  Location: [facility];  Service: Neurosurgery;  Laterality: N/A;   COLONOSCOPY  08/2013   cleared for 5 yrs- Dr Servando Snare   COLONOSCOPY WITH PROPOFOL N/A 09/04/2018   Procedure: COLONOSCOPY WITH PROPOFOL;  Surgeon: Midge Minium, MD;  Location[facility];  Service: Endoscopy;   Laterality: N/A;   JOINT REPLACEMENT  11/18/2017   REPLACEMENT TOTAL KNEE Left 09/17/2017   TRANSFORAMINAL LUMBAR INTERBODY FUSION (TLIF) WITH PEDICLE SCREW FIXATION 2 LEVEL N/A 10/15/2021   Procedure: OPEN L4-S1 TRANSFORAMINAL LUMBAR INTERBODY FUSION (TLIF);  Surgeon: Venetia Night, MD;  Location: [facility];  Service: Neurosurgery;  Laterality: N/A;   VAGINAL HYSTERECTOMY     Family History  Problem Relation Age of Onset   Cancer Mother    Hypertension Father    Breast cancer Sister 39   ADD / ADHD Sister    Cancer Sister    Cancer Sister    Cancer Brother    Cancer Brother    Social History   Socioeconomic History   Marital status: Married    Spouse name: Not on file   Number of children: 0   Years of education: Not on file   Highest education level: Not on file  Occupational History   Not on file  Tobacco Use   Smoking status: Never   Smokeless tobacco: Never  Vaping Use   Vaping Use: Never used  Substance and Sexual Activity   Alcohol use: Yes    Alcohol/week: 18.0 standard drinks of alcohol    Types: 18 Cans of beer per week   Drug use: No   Sexual activity: Yes    Birth control/protection: None  Other Topics Concern   Not on file  Social History Narrative   Not on file   Social Determinants of Health   Financial Resource Strain: Low Risk  (10/24/2021)   Overall Financial Resource Strain (CARDIA)    Difficulty of Paying Living Expenses: Not hard at all  Food Insecurity: No Food Insecurity (10/15/2021)   Hunger Vital Sign    Worried About Running Out of Food in the Last Year: Never true    Ran Out of Food in the Last Year: Never true  Transportation Needs: No Transportation Needs (10/15/2021)   PRAPARE - Administrator, Civil Service (Medical): No    Lack of Transportation (Non-Medical): No  Physical Activity: Sufficiently Active (10/24/2021)   Exercise Vital Sign    Days of Exercise per Week: 7 days    Minutes of Exercise per Session: 40 min   Stress: No Stress Concern Present (10/24/2021)   [employer] of Occupational Health - Occupational Stress Questionnaire    Feeling of Stress : Not at all  Social Connections: Moderately Integrated (10/24/2021)   Social Connection and Isolation Panel [NHANES]    Frequency of Communication with Friends and Family: More than three times a week    Frequency of Social Gatherings with Friends and Family: More than three times a week    Attends Religious Services: More than 4 times per year    Active Member of Clubs or Organizations: No    Attends  Club or Organization Meetings: Never    Marital Status: Married    Tobacco Counseling Counseling given: Not Answered   Clinical Intake:  Pre-visit preparation completed: Yes  Pain : 0-10 Pain Score: 6  Pain Type: Chronic pain (Had surgery Monday on back.) Pain Location: Back Pain Descriptors / Indicators: Constant Pain Onset: In the past 7 days     Diabetes: No     Diabetic? No.     Information entered by :: Mica Ramdass, CMA   Activities of Daily Living    10/24/2021    2:53 PM 10/15/2021    7:00 PM  In your present state of health, do you have any difficulty performing the following activities:  Hearing? 0 0  Vision? 0 0  Difficulty concentrating or making decisions? 0 0  Walking or climbing stairs? 0 0  Dressing or bathing? 0 0  Doing errands, shopping? 0 0  Preparing Food and eating ? N   Using the Toilet? N   In the past six months, have you accidently leaked urine? N   Do you have problems with loss of bowel control? N   Managing your Medications? N   Managing your Finances? N   Housekeeping or managing your Housekeeping? N     Patient Care Team: Duanne Limerick, MD as PCP - General (Family Medicine)  Indicate any recent Medical Services you may have received from other than [facility] providers in the past year (date may be approximate).     Assessment:   This is a routine wellness examination for  Keonda.  Hearing/Vision screen Hearing Screening - Comments:: No concerns. Vision Screening - Comments:: Prescription glasses.  Dietary issues and exercise activities discussed: Current Exercise Habits: Home exercise routine   Goals Addressed   None   Depression Screen    10/24/2021    2:53 PM 09/21/2021    2:34 PM 08/14/2021   10:32 AM 05/22/2021    2:17 PM 10/23/2020    2:56 PM 03/22/2020   10:16 AM 10/20/2019    3:09 PM  PHQ 2/9 Scores  PHQ - 2 Score 0 0 0 0 0 0 0  PHQ- 9 Score 0 0 0 0  0     Fall Risk    10/24/2021    2:52 PM 09/21/2021    2:34 PM 08/14/2021   10:32 AM 05/22/2021    2:18 PM 02/14/2021   10:35 AM  Fall Risk   Falls in the past year? 0 0 0 0 0  Number falls in past yr:  0 0 0 0  Injury with Fall? 0 0 0 0 0  Risk for fall due to : No Fall Risks No Fall Risks No Fall Risks No Fall Risks No Fall Risks  Follow up Falls evaluation completed Falls evaluation completed Falls evaluation completed Falls evaluation completed Falls evaluation completed    FALL RISK PREVENTION PERTAINING TO THE HOME:  Any stairs in or around the home? Yes  If so, are there any without handrails? Yes  Home free of loose throw rugs in walkways, pet beds, electrical cords, etc? Yes  Adequate lighting in your home to reduce risk of falls? Yes   ASSISTIVE DEVICES UTILIZED TO PREVENT FALLS:  Life alert? No  Use of a cane, walker or w/c? No  Grab bars in the bathroom? Yes  Shower chair or bench in shower? Yes  Elevated toilet seat or a handicapped toilet? Yes   Cognitive Function:  10/24/2021    2:54 PM  6CIT Screen  What Year? 0 points  What month? 0 points  What time? 0 points  Count back from 20 0 points  Months in reverse 0 points  Repeat phrase 2 points  Total Score 2 points    Immunizations Immunization History  Administered Date(s) Administered   Fluad Quad(high Dose 65+) 09/28/2018, 09/28/2019, 09/25/2021   Influenza Inj Mdck Quad Pf 09/29/2017    Influenza,inj,Quad PF,6+ Mos 10/06/2015, 09/24/2016   Influenza-Unspecified 09/28/2018, 09/29/2020, 09/25/2021   Moderna Covid-19 Vaccine Bivalent Booster 43yrs & up 05/25/2021   Moderna Sars-Covid-2 Vaccination 09/26/2020, 10/09/2021   PFIZER Comirnaty(Gray Top)Covid-19 Tri-Sucrose Vaccine 04/19/2020   PFIZER(Purple Top)SARS-COV-2 Vaccination 02/15/2019, 03/08/2019, 10/21/2019   PNEUMOCOCCAL CONJUGATE-20 01/26/2021   Pneumococcal Conjugate-13 11/20/2018   Tdap 03/24/2015   Zoster Recombinat (Shingrix) 10/25/2016, 04/22/2018    TDAP status: Up to date  Flu Vaccine status: Up to date  Pneumococcal vaccine status: Up to date  Covid-19 vaccine status: Completed vaccines  Qualifies for Shingles Vaccine? Yes   Zostavax completed No   Shingrix Completed?: Yes  Screening Tests Health Maintenance  Topic Date Due   COVID-19 Vaccine (7 - Pfizer series) 02/08/2022   MAMMOGRAM  08/16/2023   COLONOSCOPY (Pts 45-35yrs Insurance coverage will need to be confirmed)  09/04/2023   TETANUS/TDAP  03/23/2025   Pneumonia Vaccine 30+ Years old  Completed   INFLUENZA VACCINE  Completed   DEXA SCAN  Completed   Hepatitis C Screening  Completed   Zoster Vaccines- Shingrix  Completed   HPV VACCINES  Aged Out    Health Maintenance  There are no preventive care reminders to display for this patient.  Colorectal cancer screening: Type of screening: Colonoscopy. Completed 09/04/2018. Repeat every 5 years  Mammogram status: Completed 08/15/2021. Repeat every year  Bone Density status: Completed 11/23/2020. Results reflect: Bone density results: OSTEOPENIA. Repeat every 2 years.  Lung Cancer Screening: (Low Dose CT Chest recommended if Age 63-80 years, 30 pack-year currently smoking OR have quit w/in 15years.) does not qualify.   Additional Screening:  Hepatitis C Screening: does qualify; Completed 05/02/2016  Vision Screening: Recommended annual ophthalmology exams for early detection of  glaucoma and other disorders of the eye. Is the patient up to date with their annual eye exam?  Yes  Who is the provider or what is the name of the office in which the patient attends annual eye exams? [facility] [place] If pt is not established with a provider, would they like to be referred to a provider to establish care?  N/A .   Dental Screening: Recommended annual dental exams for proper oral hygiene  Community Resource Referral / Chronic Care Management: CRR required this visit?  No   CCM required this visit?  No      Plan:     I have personally reviewed and noted the following in the patient's chart:   Medical and social history Use of alcohol, tobacco or illicit drugs  Current medications and supplements including opioid prescriptions. Patient is not currently taking opioid prescriptions. Functional ability and status Nutritional status Physical activity Advanced directives List of other physicians Hospitalizations, surgeries, and ER visits in previous 12 months Vitals Screenings to include cognitive, depression, and falls Referrals and appointments  In addition, I have reviewed and discussed with patient certain preventive protocols, quality metrics, and best practice recommendations. A written personalized care plan for preventive services as well as general preventive health recommendations were provided to patient.  Clista Bernhardt, [place]   10/24/2021    Ms. Sloss , Thank you for taking time to come for your Medicare Wellness Visit. I appreciate your ongoing commitment to your health goals. Please review the following plan we discussed and let me know if I can assist you in the future.   These are the goals we discussed:  Goals   None     This is a list of the screening recommended for you and due dates:  Health Maintenance  Topic Date Due   COVID-19 Vaccine (7 - Pfizer series) 02/08/2022   Mammogram  08/16/2023   Colon Cancer Screening   09/04/2023   Tetanus Vaccine  03/23/2025   Pneumonia Vaccine  Completed   Flu Shot  Completed   DEXA scan (bone density measurement)  Completed   Hepatitis C Screening: USPSTF Recommendation to screen - Ages 25-79 yo.  Completed   Zoster (Shingles) Vaccine  Completed   HPV Vaccine  Aged Out      Nurse Notes: NONE.

## 2021-10-26 ENCOUNTER — Ambulatory Visit: Payer: Medicare Other | Admitting: Internal Medicine

## 2021-10-26 ENCOUNTER — Encounter: Payer: Self-pay | Admitting: Internal Medicine

## 2021-10-26 VITALS — BP 128/66 | HR 86 | Ht 68.0 in | Wt 227.6 lb

## 2021-10-26 DIAGNOSIS — R300 Dysuria: Secondary | ICD-10-CM

## 2021-10-26 DIAGNOSIS — K5903 Drug induced constipation: Secondary | ICD-10-CM

## 2021-10-26 DIAGNOSIS — N3090 Cystitis, unspecified without hematuria: Secondary | ICD-10-CM | POA: Diagnosis not present

## 2021-10-26 LAB — POCT URINALYSIS DIPSTICK
Bilirubin, UA: NEGATIVE
Blood, UA: NEGATIVE
Glucose, UA: NEGATIVE
Ketones, UA: NEGATIVE
Nitrite, UA: NEGATIVE
Protein, UA: NEGATIVE
Spec Grav, UA: 1.01 (ref 1.010–1.025)
Urobilinogen, UA: 0.2 E.U./dL
pH, UA: 6 (ref 5.0–8.0)

## 2021-10-26 MED ORDER — NITROFURANTOIN MONOHYD MACRO 100 MG PO CAPS: 100.0000 mg | ORAL_CAPSULE | Freq: Two times a day (BID) | ORAL | 0 refills | Status: AC

## 2021-10-26 NOTE — Progress Notes (Signed)
Date:  10/26/2021   Name:  Katrina Crawford   DOB:  1955/10/05   MRN:  [id number]   Chief Complaint: Urinary Tract Infection  Urinary Tract Infection  This is a new problem. Episode onset: 2 days ago. The problem has been waxing and waning. The quality of the pain is described as shooting. There has been no fever. Associated symptoms include frequency, hesitancy and urgency. Pertinent negatives include no chills, nausea or vomiting.  She just had back surgery and has gotten constipated from the pain medications.  She uses an enema and had severe diarrhea which may have triggered her UTI sx.  Lab Results  Component Value Date   NA 136 10/09/2021   K 3.5 10/09/2021   CO2 28 10/09/2021   GLUCOSE 95 10/09/2021   BUN 17 10/09/2021   CREATININE 0.59 10/09/2021   CALCIUM 10.0 10/09/2021   EGFR 89 02/14/2021   GFRNONAA >60 10/09/2021   Lab Results  Component Value Date   CHOL 193 08/14/2021   HDL 64 08/14/2021   LDLCALC 109 (H) 08/14/2021   TRIG 115 08/14/2021   CHOLHDL 3.5 07/01/2017   No results found for: "TSH" No results found for: "HGBA1C" Lab Results  Component Value Date   WBC 5.7 10/09/2021   HGB 13.6 10/09/2021   HCT 39.3 10/09/2021   MCV 93.3 10/09/2021   PLT 243 10/09/2021   Lab Results  Component Value Date   ALT 15 02/14/2021   AST 17 02/14/2021   ALKPHOS 66 02/14/2021   BILITOT 0.8 02/14/2021   No results found for: "25OHVITD2", "25OHVITD3", "VD25OH"   Review of Systems  Constitutional:  Negative for chills, fatigue and fever.  Respiratory:  Negative for chest tightness and shortness of breath.   Cardiovascular:  Negative for chest pain.  Gastrointestinal:  Positive for anal bleeding and constipation. Negative for nausea and vomiting.  Genitourinary:  Positive for dysuria, frequency, hesitancy and urgency.  Musculoskeletal:  Positive for back pain (from recent lumbar fusion).    Patient Active Problem List   Diagnosis Date Noted   Spondylolisthesis  of lumbar region 10/15/2021   Acquired spondylolisthesis    Spinal instability, lumbar    Synovial cyst of lumbar facet joint    Chronic right-sided low back pain with right-sided sciatica    Family history of malignant neoplasm of gastrointestinal tract    Status post total knee replacement using cement, left 12/04/2017   Primary osteoarthritis of left knee 08/19/2017   Obesity (BMI 35.0-39.9 without comorbidity) 04/07/2017   Essential hypertension 05/02/2016   Mixed hyperlipidemia 05/02/2016   HSV-1 infection 05/02/2016    Allergies  Allergen Reactions   Codeine Nausea Only   Penicillins Rash   Sulfa Antibiotics Rash    Past Surgical History:  Procedure Laterality Date   APPENDECTOMY     APPLICATION OF INTRAOPERATIVE CT SCAN N/A 10/15/2021   Procedure: APPLICATION OF INTRAOPERATIVE CT SCAN;  Surgeon: Venetia Night, MD;  Location: [facility];  Service: Neurosurgery;  Laterality: N/A;   COLONOSCOPY  08/2013   cleared for 5 yrs- Dr Servando Snare   COLONOSCOPY WITH PROPOFOL N/A 09/04/2018   Procedure: COLONOSCOPY WITH PROPOFOL;  Surgeon: Midge Minium, MD;  Location: [facility];  Service: Endoscopy;  Laterality: N/A;   JOINT REPLACEMENT  11/18/2017   REPLACEMENT TOTAL KNEE Left 09/17/2017   TRANSFORAMINAL LUMBAR INTERBODY FUSION (TLIF) WITH PEDICLE SCREW FIXATION 2 LEVEL N/A 10/15/2021   Procedure: OPEN L4-S1 TRANSFORAMINAL LUMBAR INTERBODY FUSION (TLIF);  Surgeon: Venetia Night,  MD;  Location: [facility];  Service: Neurosurgery;  Laterality: N/A;   VAGINAL HYSTERECTOMY      Social History   Tobacco Use   Smoking status: Never   Smokeless tobacco: Never  Vaping Use   Vaping Use: Never used  Substance Use Topics   Alcohol use: Yes    Alcohol/week: 18.0 standard drinks of alcohol    Types: 18 Cans of beer per week   Drug use: No     Medication list has been reviewed and updated.  Current Meds  Medication Sig   Calcium Carb-Cholecalciferol (CALCIUM 600+D) 600-20  MG-MCG TABS Take 2 tablets by mouth daily.   celecoxib (CELEBREX) 200 MG capsule Take 1 capsule (200 mg total) by mouth 2 (two) times daily.   cyanocobalamin 2000 MCG tablet Take 2,000 mcg by mouth daily.   diclofenac Sodium (VOLTAREN) 1 % GEL Apply 2 g topically daily.   gabapentin (NEURONTIN) 100 MG capsule Take 100 mg by mouth at bedtime.   hydrochlorothiazide (HYDRODIURIL) 25 MG tablet Take 1 tablet (25 mg total) by mouth daily.   L-Lysine 1000 MG TABS Take 2 tablets (2,000 mg total) by mouth daily. otc   losartan (COZAAR) 50 MG tablet Take 1 tablet (50 mg total) by mouth daily.   methocarbamol (ROBAXIN) 500 MG tablet Take 1 tablet (500 mg total) by mouth every 6 (six) hours as needed for muscle spasms.   mupirocin ointment (BACTROBAN) 2 % Apply 1 application topically 2 (two) times daily.   senna (SENOKOT) 8.6 MG TABS tablet Take 1 tablet (8.6 mg total) by mouth 2 (two) times daily.   simvastatin (ZOCOR) 40 MG tablet Take 1 tablet (40 mg total) by mouth daily. (Patient taking differently: Take 40 mg by mouth at bedtime.)   valACYclovir (VALTREX) 1000 MG tablet Take 1 tablet (1,000 mg total) by mouth 2 (two) times daily. As needed for breakout       10/26/2021    1:41 PM 09/21/2021    2:34 PM 08/14/2021   10:32 AM 05/22/2021    2:17 PM  GAD 7 : Generalized Anxiety Score  Nervous, Anxious, on Edge 0 0 0 0  Control/stop worrying 0 0 0 0  Worry too much - different things 0 0 0 0  Trouble relaxing 0 0 0 0  Restless 0 0 0 0  Easily annoyed or irritable 0 0 0 0  Afraid - awful might happen 0 0 0 0  Total GAD 7 Score 0 0 0 0  Anxiety Difficulty Not difficult at all Not difficult at all Not difficult at all Not difficult at all       10/26/2021    1:41 PM 10/24/2021    2:53 PM 09/21/2021    2:34 PM  Depression screen PHQ 2/9  Decreased Interest 0 0 0  Down, Depressed, Hopeless 0 0 0  PHQ - 2 Score 0 0 0  Altered sleeping 0 0 0  Tired, decreased energy 0 0 0  Change in appetite 0 0 0   Feeling bad or failure about yourself  0 0 0  Trouble concentrating 0 0 0  Moving slowly or fidgety/restless 0 0 0  Suicidal thoughts 0 0 0  PHQ-9 Score 0 0 0  Difficult doing work/chores Somewhat difficult Not difficult at all Not difficult at all    BP Readings from Last 3 Encounters:  10/26/21 128/66  10/17/21 114/65  10/09/21 133/70    Physical Exam Vitals and nursing note reviewed.  Constitutional:  General: She is not in acute distress.    Appearance: She is well-developed.  HENT:     Head: Normocephalic and atraumatic.  Cardiovascular:     Rate and Rhythm: Normal rate and regular rhythm.  Pulmonary:     Effort: Pulmonary effort is normal. No respiratory distress.  Abdominal:     General: Abdomen is flat.     Palpations: Abdomen is soft.     Tenderness: There is no abdominal tenderness.  Skin:    General: Skin is warm and dry.     Findings: No rash.  Neurological:     Mental Status: She is alert and oriented to person, place, and time.  Psychiatric:        Mood and Affect: Mood normal.        Behavior: Behavior normal.     Wt Readings from Last 3 Encounters:  10/26/21 227 lb 9.6 oz (103.2 kg)  10/15/21 220 lb (99.8 kg)  10/09/21 225 lb (102.1 kg)    BP 128/66   Pulse 86   Ht $R'5\' 8"'tu$  (1.727 m)   Wt 227 lb 9.6 oz (103.2 kg)   SpO2 98%   BMI 34.61 kg/m   Assessment and Plan: 1. Dysuria UA dip is positive - QNS for microscopic or culture. - POCT Urinalysis Dipstick  2. Drug-induced constipation Continue Senna Add Miralax powder daily - adjust dose to effect  3. Cystitis Continue to increase fluids. - nitrofurantoin, macrocrystal-monohydrate, (MACROBID) 100 MG capsule; Take 1 capsule (100 mg total) by mouth 2 (two) times daily for 7 days.  Dispense: 14 capsule; Refill: 0   Partially dictated using Editor, commissioning. Any errors are unintentional.  Halina Maidens, MD [facility]  10/26/2021

## 2021-10-26 NOTE — Patient Instructions (Signed)
Try Miralax powder to help with constipation.

## 2021-10-29 NOTE — Progress Notes (Deleted)
   REFERRING PHYSICIAN:  Meade Maw, [address] [place],  [place] [address]  DOS: 10/15/21 L4-S1 transforaminal lumbar interbody fusion   HISTORY OF PRESENT ILLNESS: Katrina Crawford is approximately 2 weeks status post L4-S1 transforaminal lumbar interbody fusion. Was given celebrex, robaxin, and oxycodone on discharge from the hospital.     PHYSICAL EXAMINATION:  General: Patient is well developed, well nourished, calm, collected, and in no apparent distress.   NEUROLOGICAL:  General: In no acute distress.   Awake, alert, oriented to person, place, and time.  Pupils equal round and reactive to light.  Facial tone is symmetric.  Tongue protrusion is midline.  There is no pronator drift.   Strength:            Side Iliopsoas Quads Hamstring PF DF EHL  R 5 5 5 5 5 5   L 5 5 5 5 5 5    Incision c/d/i   ROS (Neurologic):  Negative except as noted above  IMAGING: Nothing new to review.   ASSESSMENT/PLAN:  Katrina Crawford is doing well s/p above surgery. Treatment options reviewed with patient and following plan made:   - I have advised the patient to lift up to 10 pounds until 6 weeks after surgery (follow up with Dr. Izora Ribas).  - Reviewed wound care.  - No bending, twisting, or lifting.  - Continue on current medications including ***.  - Follow up as scheduled in 4 weeks and prn.   Advised to contact the office if any questions or concerns arise.  Geronimo Boot PA-C Department of neurosurgery

## 2021-10-30 ENCOUNTER — Encounter: Payer: Self-pay | Admitting: Orthopedic Surgery

## 2021-10-30 ENCOUNTER — Ambulatory Visit (INDEPENDENT_AMBULATORY_CARE_PROVIDER_SITE_OTHER): Payer: Medicare Other | Admitting: Orthopedic Surgery

## 2021-10-30 ENCOUNTER — Encounter: Payer: Medicare Other | Admitting: Orthopedic Surgery

## 2021-10-30 VITALS — BP 136/78 | Temp 97.7°F | Ht 68.0 in | Wt 227.0 lb

## 2021-10-30 DIAGNOSIS — Z981 Arthrodesis status: Secondary | ICD-10-CM

## 2021-10-30 DIAGNOSIS — M532X6 Spinal instabilities, lumbar region: Secondary | ICD-10-CM

## 2021-10-30 DIAGNOSIS — M4316 Spondylolisthesis, lumbar region: Secondary | ICD-10-CM

## 2021-10-30 DIAGNOSIS — M7138 Other bursal cyst, other site: Secondary | ICD-10-CM

## 2021-10-30 DIAGNOSIS — Z09 Encounter for follow-up examination after completed treatment for conditions other than malignant neoplasm: Secondary | ICD-10-CM

## 2021-10-30 MED ORDER — OXYCODONE HCL 5 MG PO TABS: 5.0000 mg | ORAL_TABLET | Freq: Three times a day (TID) | ORAL | 0 refills | Status: AC | PRN

## 2021-10-30 NOTE — Progress Notes (Signed)
   REFERRING PHYSICIAN:  Meade Maw, [place],  [place] [address]  DOS: 10/15/21 L4-S1 transforaminal lumbar interbody fusion   HISTORY OF PRESENT ILLNESS: Katrina Crawford is approximately 2 weeks status post L4-S1 transforaminal lumbar interbody fusion. Was given celebrex, robaxin, and oxycodone on discharge from the hospital.   She has mild low back pain and overall is doing well. She is taking prn oxycodone (mostly at night). She is taking robaxin and celebrex as well.   Her preop leg pain is gone! She has been walking daily.    PHYSICAL EXAMINATION:  General: Patient is well developed, well nourished, calm, collected, and in no apparent distress.   NEUROLOGICAL:  General: In no acute distress.   Awake, alert, oriented to person, place, and time.  Pupils equal round and reactive to light.  Facial tone is symmetric.     Strength:            Side Iliopsoas Quads Hamstring PF DF EHL  R 5 5 5 5 5 5   L 5 5 5 5 5 5    Incision c/d/i   ROS (Neurologic):  Negative except as noted above  IMAGING: Nothing new to review.   ASSESSMENT/PLAN:  Katrina Crawford is doing well s/p above surgery. Treatment options reviewed with patient and following plan made:   - I have advised the patient to lift up to 10 pounds until 6 weeks after surgery (follow up with Dr. Izora Ribas).  - Reviewed wound care.  - No bending, twisting, or lifting.  - Continue on current medications including prn oxycodone. Taking mostly at night. PMP reviewed and is appropriate. Refill done. - Okay to continue on prn robaxin. Continue celebrex.  - Okay to drive if not taking oxycodone.  - Follow up as scheduled in 4 weeks and prn.   Advised to contact the office if any questions or concerns arise.  Geronimo Boot PA-C Department of neurosurgery

## 2021-11-09 ENCOUNTER — Encounter: Payer: Self-pay | Admitting: Neurosurgery

## 2021-11-09 DIAGNOSIS — M4316 Spondylolisthesis, lumbar region: Secondary | ICD-10-CM

## 2021-11-09 DIAGNOSIS — M532X6 Spinal instabilities, lumbar region: Secondary | ICD-10-CM

## 2021-11-09 DIAGNOSIS — Z981 Arthrodesis status: Secondary | ICD-10-CM

## 2021-11-09 MED ORDER — OXYCODONE HCL 5 MG PO TABS: 5.0000 mg | ORAL_TABLET | Freq: Three times a day (TID) | ORAL | 0 refills | Status: DC | PRN

## 2021-11-09 NOTE — Telephone Encounter (Signed)
DOS: 10/15/21 L4-S1 transforaminal lumbar interbody fusion   PMP reviewed and is appropriate.   Refill of oxycodone sent to pharmacy.

## 2021-11-09 NOTE — Telephone Encounter (Signed)
She is checking on getting her medication refill before the weekend.

## 2021-11-14 ENCOUNTER — Encounter: Payer: Self-pay | Admitting: Neurosurgery

## 2021-11-15 MED ORDER — CELECOXIB 200 MG PO CAPS: 200.0000 mg | ORAL_CAPSULE | Freq: Two times a day (BID) | ORAL | 0 refills | Status: DC

## 2021-11-15 NOTE — Telephone Encounter (Signed)
Celebrex sent to pharmacy. Sulfa allergy noted. Has been tolerating celebrex with no issues.

## 2021-11-16 ENCOUNTER — Encounter: Payer: Self-pay | Admitting: Orthopedic Surgery

## 2021-11-16 NOTE — Progress Notes (Signed)
Called patient to check on her. She is having general soreness. She has been taking celebrex with no issues. Allergy to sulfa noted. Discussed cross-sensitivity- she will stop celebrex if any issues.   She can take celebrex as needed. Follow up as scheduled.

## 2021-11-26 ENCOUNTER — Other Ambulatory Visit: Payer: Self-pay

## 2021-11-26 DIAGNOSIS — Z981 Arthrodesis status: Secondary | ICD-10-CM

## 2021-11-27 ENCOUNTER — Ambulatory Visit (INDEPENDENT_AMBULATORY_CARE_PROVIDER_SITE_OTHER): Payer: Medicare Other | Admitting: Neurosurgery

## 2021-11-27 ENCOUNTER — Encounter: Payer: Self-pay | Admitting: Neurosurgery

## 2021-11-27 ENCOUNTER — Ambulatory Visit
Admission: RE | Admit: 2021-11-27 | Discharge: 2021-11-27 | Disposition: A | Discharge status: Home / Self Care | Payer: Medicare Other | Source: Ambulatory Visit | Attending: Neurosurgery | Admitting: Neurosurgery

## 2021-11-27 ENCOUNTER — Ambulatory Visit
Admission: RE | Admit: 2021-11-27 | Discharge: 2021-11-27 | Disposition: A | Discharge status: Home / Self Care | Payer: Medicare Other | Attending: Neurosurgery | Admitting: Neurosurgery

## 2021-11-27 VITALS — BP 122/78 | Temp 98.0°F | Ht 68.0 in | Wt 227.0 lb

## 2021-11-27 DIAGNOSIS — M4310 Spondylolisthesis, site unspecified: Secondary | ICD-10-CM

## 2021-11-27 DIAGNOSIS — Z981 Arthrodesis status: Secondary | ICD-10-CM

## 2021-11-27 DIAGNOSIS — Z09 Encounter for follow-up examination after completed treatment for conditions other than malignant neoplasm: Secondary | ICD-10-CM

## 2021-11-27 DIAGNOSIS — M4316 Spondylolisthesis, lumbar region: Secondary | ICD-10-CM

## 2021-11-27 DIAGNOSIS — M532X6 Spinal instabilities, lumbar region: Secondary | ICD-10-CM

## 2021-11-27 MED ORDER — OXYCODONE HCL 5 MG PO TABS: 5.0000 mg | ORAL_TABLET | Freq: Three times a day (TID) | ORAL | 0 refills | Status: DC | PRN

## 2021-11-27 MED ORDER — METHOCARBAMOL 500 MG PO TABS: 500.0000 mg | ORAL_TABLET | Freq: Four times a day (QID) | ORAL | 0 refills | Status: DC | PRN

## 2021-11-27 NOTE — Progress Notes (Signed)
   REFERRING PHYSICIAN:  Juline Patch, Md [address] [place],  [place] [address]  DOS: 10/15/21 L4-S1 transforaminal lumbar interbody fusion   HISTORY OF PRESENT ILLNESS: Katrina Crawford is status post L4-S1 transforaminal lumbar interbody fusion.  She is doing very well.    She is using her methocarbamol and having 1 oxycodone at nighttime to help with sleep.  PHYSICAL EXAMINATION:  General: Patient is well developed, well nourished, calm, collected, and in no apparent distress.   NEUROLOGICAL:  General: In no acute distress.   Awake, alert, oriented to person, place, and time.  Pupils equal round and reactive to light.  Facial tone is symmetric.     Strength:            Side Iliopsoas Quads Hamstring PF DF EHL  R 5 5 5 5 5 5   L 5 5 5 5 5 5    Incision c/d/i   ROS (Neurologic):  Negative except as noted above  IMAGING: No complications noted  ASSESSMENT/PLAN:  Katrina Crawford is doing well s/p above surgery.  I will refill her medications 1 additional time.  I will see her back in 6 weeks.  We reviewed her activity limitations.  She is doing extremely well.    Meade Maw MD Department of neurosurgery

## 2021-11-30 ENCOUNTER — Encounter: Payer: Self-pay | Admitting: Neurosurgery

## 2021-11-30 MED ORDER — CLINDAMYCIN HCL 300 MG PO CAPS: 300.0000 mg | ORAL_CAPSULE | Freq: Three times a day (TID) | ORAL | 0 refills | Status: AC

## 2021-12-27 ENCOUNTER — Other Ambulatory Visit: Payer: Self-pay | Admitting: Orthopedic Surgery

## 2021-12-27 MED ORDER — CELECOXIB 200 MG PO CAPS: 200.0000 mg | ORAL_CAPSULE | Freq: Two times a day (BID) | ORAL | 0 refills | Status: DC

## 2021-12-27 NOTE — Telephone Encounter (Signed)
Okay to refill celebrex one more time. Sulfa allergy noted, discussed cross sensitivity with her at last refill. Advised to stop if any issues.

## 2021-12-27 NOTE — Telephone Encounter (Signed)
From: Durwin Reges To: Office of Smiley Houseman, [place] Sent: 12/26/2021 5:11 PM EST Subject: Medication Renewal Request  Refills have been requested for the following medications:   celecoxib (CELEBREX) 200 MG capsule Drake Leach M]  Preferred pharmacy: [id number] - [place], [place] - [address] Delivery method: [employer]

## 2021-12-31 ENCOUNTER — Other Ambulatory Visit: Payer: Self-pay | Admitting: Neurosurgery

## 2021-12-31 DIAGNOSIS — M4316 Spondylolisthesis, lumbar region: Secondary | ICD-10-CM

## 2021-12-31 DIAGNOSIS — Z981 Arthrodesis status: Secondary | ICD-10-CM

## 2021-12-31 DIAGNOSIS — M532X6 Spinal instabilities, lumbar region: Secondary | ICD-10-CM

## 2022-01-01 ENCOUNTER — Other Ambulatory Visit: Payer: Self-pay | Admitting: Family Medicine

## 2022-01-01 DIAGNOSIS — I10 Essential (primary) hypertension: Secondary | ICD-10-CM

## 2022-01-01 NOTE — Telephone Encounter (Signed)
DOS: 10/15/21 L4-S1 transforaminal lumbar interbody fusion    Got a refill request from her pharmacy.   Was doing well at last visit and was taking one oxycodone at night.   Per Dr. Lucienne Capers last note, the refill he gave her on 11/7 was the last refill. Do not recommend any further refills.   She may not have requested a refill as it came from her pharmacy, but please let her know.

## 2022-01-02 ENCOUNTER — Other Ambulatory Visit: Payer: Self-pay | Admitting: Family Medicine

## 2022-01-02 DIAGNOSIS — I10 Essential (primary) hypertension: Secondary | ICD-10-CM

## 2022-01-02 DIAGNOSIS — E782 Mixed hyperlipidemia: Secondary | ICD-10-CM

## 2022-01-02 NOTE — Telephone Encounter (Signed)
Requested Prescriptions  Pending Prescriptions Disp Refills   hydrochlorothiazide (HYDRODIURIL) 25 MG tablet [Pharmacy Med Name: hydroCHLOROthiazide 25 MG Oral Tablet] 90 tablet 3    Sig: TAKE 1 TABLET BY MOUTH DAILY     Cardiovascular: Diuretics - Thiazide Passed - 01/02/2022  6:40 AM      Passed - Cr in normal range and within 180 days    Creatinine, Ser  Date Value Ref Range Status  10/09/2021 0.59 0.44 - 1.00 mg/dL Final         Passed - K in normal range and within 180 days    Potassium  Date Value Ref Range Status  10/09/2021 3.5 3.5 - 5.1 mmol/L Final         Passed - Na in normal range and within 180 days    Sodium  Date Value Ref Range Status  10/09/2021 136 135 - 145 mmol/L Final  02/14/2021 139 134 - 144 mmol/L Final         Passed - Last BP in normal range    BP Readings from Last 1 Encounters:  11/27/21 122/78         Passed - Valid encounter within last 6 months    Recent Outpatient Visits           2 months ago [place] [facility] at [facility], Jesse Sans, MD   3 months ago Cystitis   [place] [facility] [place], Deanna C, MD   4 months ago Essential hypertension   [place] [facility] at [place], [place], MD   7 months ago Dysfunction of both eustachian tubes   [place] [facility] at [place], Deanna C, MD   10 months ago Essential hypertension   [place] [facility], MD       Future Appointments             In 1 month Juline Patch, MD [facility] at [facility] [place]             simvastatin (ZOCOR) 40 MG tablet [Pharmacy Med Name: Simvastatin 40 MG Oral Tablet] 90 tablet 3    Sig: TAKE 1 TABLET BY MOUTH DAILY     Cardiovascular:  Antilipid - Statins Failed - 01/02/2022  6:40  AM      Failed - Lipid Panel in normal range within the last 12 months    Cholesterol, Total  Date Value Ref Range Status  08/14/2021 193 100 - 199 mg/dL Final   LDL Chol Calc (NIH)  Date Value Ref Range Status  08/14/2021 109 (H) 0 - 99 mg/dL Final   HDL  Date Value Ref Range Status  08/14/2021 64 >39 mg/dL Final   Triglycerides  Date Value Ref Range Status  08/14/2021 115 0 - 149 mg/dL Final         Passed - Patient is not pregnant      Passed - Valid encounter within last 12 months    Recent Outpatient Visits           2 months ago [place] [facility] at [facility], Jesse Sans, MD   3 months ago [place] [facility] at [facility] [place]  C, MD   4 months ago Essential hypertension   [place] [facility] In[facility], MD   7 months ago Dysfunction of both eustachian tubes   [facility] at [facility], MD   10 months ago Essential hypertension    Primary Care and Sports Medicine at [facility], MD       Future Appointments             In 1 month Duanne Limerick, MD [facility] and [facility] at [facility]

## 2022-01-04 ENCOUNTER — Other Ambulatory Visit: Payer: Self-pay

## 2022-01-04 DIAGNOSIS — Z981 Arthrodesis status: Secondary | ICD-10-CM

## 2022-01-07 ENCOUNTER — Ambulatory Visit
Admission: RE | Admit: 2022-01-07 | Discharge: 2022-01-07 | Disposition: A | Discharge status: Home / Self Care | Payer: Medicare Other | Source: Ambulatory Visit | Attending: Neurosurgery | Admitting: Neurosurgery

## 2022-01-07 ENCOUNTER — Ambulatory Visit
Admission: RE | Admit: 2022-01-07 | Discharge: 2022-01-07 | Disposition: A | Discharge status: Home / Self Care | Payer: Medicare Other | Attending: Neurosurgery | Admitting: Neurosurgery

## 2022-01-07 DIAGNOSIS — Z981 Arthrodesis status: Secondary | ICD-10-CM | POA: Diagnosis present

## 2022-01-08 ENCOUNTER — Ambulatory Visit (INDEPENDENT_AMBULATORY_CARE_PROVIDER_SITE_OTHER): Payer: Medicare Other | Admitting: Neurosurgery

## 2022-01-08 ENCOUNTER — Encounter: Payer: Self-pay | Admitting: Neurosurgery

## 2022-01-08 VITALS — BP 134/84 | Ht 68.0 in | Wt 227.0 lb

## 2022-01-08 DIAGNOSIS — M532X6 Spinal instabilities, lumbar region: Secondary | ICD-10-CM

## 2022-01-08 DIAGNOSIS — M4310 Spondylolisthesis, site unspecified: Secondary | ICD-10-CM

## 2022-01-08 DIAGNOSIS — Z09 Encounter for follow-up examination after completed treatment for conditions other than malignant neoplasm: Secondary | ICD-10-CM

## 2022-01-08 DIAGNOSIS — Z981 Arthrodesis status: Secondary | ICD-10-CM

## 2022-01-08 DIAGNOSIS — M4316 Spondylolisthesis, lumbar region: Secondary | ICD-10-CM

## 2022-01-08 NOTE — Progress Notes (Signed)
   REFERRING PHYSICIAN:  Duanne Limerick, Md [address] [place],  [place] [address]  DOS: 10/15/21 L4-S1 transforaminal lumbar interbody fusion   HISTORY OF PRESENT ILLNESS: Katrina Crawford is status post L4-S1 transforaminal lumbar interbody fusion.  She is doing very well.     PHYSICAL EXAMINATION:  General: Patient is well developed, well nourished, calm, collected, and in no apparent distress.   NEUROLOGICAL:  General: In no acute distress.   Awake, alert, oriented to person, place, and time.  Pupils equal round and reactive to light.  Facial tone is symmetric.     Strength:            Side Iliopsoas Quads Hamstring PF DF EHL  R 5 5 5 5 5 5   L 5 5 5 5 5 5    Incision c/d/i   ROS (Neurologic):  Negative except as noted above  IMAGING: No complications noted  ASSESSMENT/PLAN:  Katrina Crawford is doing well s/p above surgery.  I am very happy with how she is doing.  I will see her back in about 6 months with x-rays.  She is off activity limitations.   MD Department of neurosurgery

## 2022-01-28 ENCOUNTER — Encounter: Payer: Self-pay | Admitting: Podiatry

## 2022-01-29 ENCOUNTER — Other Ambulatory Visit: Payer: Self-pay | Admitting: Podiatry

## 2022-01-30 NOTE — Discharge Instructions (Signed)
[facility]  POST OPERATIVE INSTRUCTIONS FOR DR. FOWLER AND [facility]   Take your medication as prescribed.  Pain medication should be taken only as needed.  Keep the dressing clean, dry and intact.  Keep your foot elevated above the heart level for the first 48 hours.  Walking to the bathroom and brief periods of walking are acceptable, unless we have instructed you to be non-weight bearing.  Always wear your post-op shoe when walking.  Always use your crutches if you are to be non-weight bearing.  Do not take a shower. Baths are permissible as long as the foot is kept out of the water.   Every hour you are awake:  Bend your knee 15 times. Flex foot 15 times Massage calf 15 times  Call [facility] ([phone]) if any of the following problems occur: You develop a temperature or fever. The bandage becomes saturated with blood. Medication does not stop your pain. Injury of the foot occurs. Any symptoms of infection including redness, odor, or red streaks running from wound. 

## 2022-02-06 ENCOUNTER — Other Ambulatory Visit: Payer: Self-pay

## 2022-02-06 ENCOUNTER — Ambulatory Visit: Payer: Self-pay

## 2022-02-06 ENCOUNTER — Encounter: Payer: Self-pay | Admitting: Podiatry

## 2022-02-06 ENCOUNTER — Encounter
Admission: RE | Disposition: A | Discharge status: Home / Self Care | Payer: Self-pay | Source: Ambulatory Visit | Attending: Podiatry

## 2022-02-06 ENCOUNTER — Ambulatory Visit: Payer: Medicare Other | Admitting: Anesthesiology

## 2022-02-06 ENCOUNTER — Ambulatory Visit
Admission: RE | Admit: 2022-02-06 | Discharge: 2022-02-06 | Disposition: A | Discharge status: Home / Self Care | Payer: Medicare Other | Source: Ambulatory Visit | Attending: Podiatry | Admitting: Podiatry

## 2022-02-06 DIAGNOSIS — M1990 Unspecified osteoarthritis, unspecified site: Secondary | ICD-10-CM | POA: Diagnosis not present

## 2022-02-06 DIAGNOSIS — M205X1 Other deformities of toe(s) (acquired), right foot: Secondary | ICD-10-CM | POA: Insufficient documentation

## 2022-02-06 DIAGNOSIS — M2041 Other hammer toe(s) (acquired), right foot: Secondary | ICD-10-CM | POA: Diagnosis not present

## 2022-02-06 DIAGNOSIS — M2011 Hallux valgus (acquired), right foot: Secondary | ICD-10-CM | POA: Insufficient documentation

## 2022-02-06 HISTORY — PX: CAPSULOTOMY METATARSOPHALANGEAL: SHX6614

## 2022-02-06 HISTORY — PX: BUNIONECTOMY: SHX129

## 2022-02-06 SURGERY — BUNIONECTOMY
Anesthesia: General | Site: Toe | Laterality: Right

## 2022-02-06 MED ORDER — MIDAZOLAM HCL 5 MG/5ML IJ SOLN
INTRAMUSCULAR | Status: DC | PRN
  Administered 2022-02-06: 2 mg via INTRAVENOUS

## 2022-02-06 MED ORDER — LIDOCAINE HCL (CARDIAC) PF 100 MG/5ML IV SOSY
PREFILLED_SYRINGE | INTRAVENOUS | Status: DC | PRN
  Administered 2022-02-06: 40 mg via INTRATRACHEAL

## 2022-02-06 MED ORDER — FENTANYL CITRATE (PF) 100 MCG/2ML IJ SOLN
INTRAMUSCULAR | Status: DC | PRN
  Administered 2022-02-06: 50 ug via INTRAVENOUS

## 2022-02-06 MED ORDER — SODIUM CHLORIDE 0.9 % IR SOLN
Status: DC | PRN
  Administered 2022-02-06: 500 mL

## 2022-02-06 MED ORDER — BUPIVACAINE HCL (PF) 0.25 % IJ SOLN
INTRAMUSCULAR | Status: DC | PRN
  Administered 2022-02-06: 10 mL

## 2022-02-06 MED ORDER — PROPOFOL 10 MG/ML IV BOLUS
INTRAVENOUS | Status: DC | PRN
  Administered 2022-02-06: 200 mg via INTRAVENOUS

## 2022-02-06 MED ORDER — EPHEDRINE SULFATE (PRESSORS) 50 MG/ML IJ SOLN
INTRAMUSCULAR | Status: DC | PRN
  Administered 2022-02-06 (×8): 5 mg via INTRAVENOUS

## 2022-02-06 MED ORDER — BUPIVACAINE LIPOSOME 1.3 % IJ SUSP
INTRAMUSCULAR | Status: DC | PRN
  Administered 2022-02-06: 10 mL

## 2022-02-06 MED ORDER — KETOROLAC TROMETHAMINE 15 MG/ML IJ SOLN
INTRAMUSCULAR | Status: DC | PRN
  Administered 2022-02-06: 15 mg via INTRAVENOUS

## 2022-02-06 MED ORDER — ONDANSETRON HCL 4 MG/2ML IJ SOLN
INTRAMUSCULAR | Status: DC | PRN
  Administered 2022-02-06: 4 mg via INTRAVENOUS

## 2022-02-06 MED ORDER — OXYCODONE-ACETAMINOPHEN 5-325 MG PO TABS: 1.0000 | ORAL_TABLET | Freq: Four times a day (QID) | ORAL | 0 refills | Status: DC | PRN

## 2022-02-06 MED ORDER — DEXAMETHASONE SODIUM PHOSPHATE 4 MG/ML IJ SOLN
INTRAMUSCULAR | Status: DC | PRN
  Administered 2022-02-06: 4 mg via INTRAVENOUS

## 2022-02-06 MED ORDER — LACTATED RINGERS IV SOLN: INTRAVENOUS | Status: DC

## 2022-02-06 MED ORDER — CEFAZOLIN SODIUM-DEXTROSE 2-4 GM/100ML-% IV SOLN
2.0000 g | INTRAVENOUS | Status: AC
  Administered 2022-02-06 (×2): 2 g via INTRAVENOUS

## 2022-02-06 SURGICAL SUPPLY — 41 items
APL SKNCLS STERI-STRIP NONHPOA (GAUZE/BANDAGES/DRESSINGS) ×2
BENZOIN TINCTURE PRP APPL 2/3 (GAUZE/BANDAGES/DRESSINGS) ×2 IMPLANT
BLADE SAW LAPIPLASTY 40X11 (BLADE) IMPLANT
BLADE SURG 15 STRL LF DISP TIS (BLADE) IMPLANT
BLADE SURG 15 STRL SS (BLADE) ×4
BNDG CMPR 75X41 PLY HI ABS (GAUZE/BANDAGES/DRESSINGS) ×2
BNDG ELASTIC 4X5.8 VLCR STR LF (GAUZE/BANDAGES/DRESSINGS) ×2 IMPLANT
BNDG ESMARK 4X12 TAN STRL LF (GAUZE/BANDAGES/DRESSINGS) ×2 IMPLANT
BNDG STRETCH 4X75 STRL LF (GAUZE/BANDAGES/DRESSINGS) ×2 IMPLANT
BOOT STEPPER DURA MED (SOFTGOODS) IMPLANT
CANISTER SUCT 1200ML W/VALVE (MISCELLANEOUS) ×2 IMPLANT
COVER LIGHT HANDLE UNIVERSAL (MISCELLANEOUS) ×4 IMPLANT
CUFF TOURN SGL QUICK 18X4 (TOURNIQUET CUFF) IMPLANT
DRAPE FLUOR MINI C-ARM 54X84 (DRAPES) ×2 IMPLANT
DURAPREP 26ML APPLICATOR (WOUND CARE) ×2 IMPLANT
ELECT REM PT RETURN 9FT ADLT (ELECTROSURGICAL) ×2
ELECTRODE REM PT RTRN 9FT ADLT (ELECTROSURGICAL) ×2 IMPLANT
GAUZE SPONGE 4X4 12PLY STRL (GAUZE/BANDAGES/DRESSINGS) ×2 IMPLANT
GAUZE XEROFORM 1X8 LF (GAUZE/BANDAGES/DRESSINGS) ×2 IMPLANT
GLOVE SRG 8 PF TXTR STRL LF DI (GLOVE) ×4 IMPLANT
GLOVE SURG ENC MOIS LTX SZ7.5 (GLOVE) ×4 IMPLANT
GLOVE SURG UNDER POLY LF SZ8 (GLOVE) ×4
GOWN STRL REUS W/ TWL LRG LVL3 (GOWN DISPOSABLE) ×4 IMPLANT
GOWN STRL REUS W/TWL LRG LVL3 (GOWN DISPOSABLE) ×4
KIT TURNOVER KIT A (KITS) ×2 IMPLANT
LAPIPLASTY SYS 4A (Orthopedic Implant) ×2 IMPLANT
NS IRRIG 500ML POUR BTL (IV SOLUTION) ×2 IMPLANT
PACK EXTREMITY ARMC (MISCELLANEOUS) ×2 IMPLANT
RASP SM TEAR CROSS CUT (RASP) IMPLANT
SCREW 2.7 HIGH PITCH LOCKING (Screw) IMPLANT
SCREW HIGH PITCH LOCK 2.7 (Screw) IMPLANT
STOCKINETTE IMPERVIOUS LG (DRAPES) ×2 IMPLANT
STRIP CLOSURE SKIN 1/4X4 (GAUZE/BANDAGES/DRESSINGS) ×2 IMPLANT
SUT ETHILON 3-0 (SUTURE) IMPLANT
SUT MNCRL 4-0 (SUTURE) ×2
SUT MNCRL 4-0 27XMFL (SUTURE) ×2
SUT VIC AB 3-0 SH 27 (SUTURE) ×2
SUT VIC AB 3-0 SH 27X BRD (SUTURE) IMPLANT
SUT VIC AB 4-0 FS2 27 (SUTURE) IMPLANT
SUTURE MNCRL 4-0 27XMF (SUTURE) IMPLANT
SYSTEM LAPIPLASTY 4A (Orthopedic Implant) IMPLANT

## 2022-02-06 NOTE — Op Note (Signed)
Operative note   Surgeon:Belen Pesch Lawyer: None    Preop diagnosis: 1.  Hallux valgus deformity right foot 2.  Contracted right second toe MTPJ 3.  Hammertoe contracture right second toe    Postop diagnosis: Same    Procedure: 1.  Lapidus hallux valgus correction right foot 2.  Metatarsophalangeal joint release right second toe joint 3.  Extensor longus tendon lengthening right second toe 4.  Intraoperative fluoroscopy without assistance of radiologist    EBL: Minimal    Anesthesia: Local and generallocal and general.  Local consisted of a total of 20 cc of a one-to-one mixture of 0.25% bupivacaine plain and Exparel long-acting anesthetic    Hemostasis: Midcalf tourniquet inflated to 200 mmHg for approximately 75 minutes    Specimen: None    Complications: None    Operative indications:Katrina Crawford is an 67 y.o. that presents today for surgical intervention.  The risks/benefits/alternatives/complications have been discussed and consent has been given.    Procedure:  Patient was brought into the OR and placed on the operating table in thesupine position. After anesthesia was obtained theright lower extremity was prepped and draped in usual sterile fashion.  Attention was directed to the dorsal medial right first MTPJ where an incision was performed.  Sharp and blunt dissection carried down to the capsule.  The intermetatarsal space was entered.  The DTI L was transected.  The conjoined tendon of the abductor was released.  Good realignment of the MTPJ was noted.  Attention was directed to the dorsal first met cuneiform joint where an incision was performed.  Sharp and blunt dissection carried down to the periosteum.  Subperiosteal dissection was undertaken.  The met cuneiform joint was exposed.  The fulcrum was placed at the base of the first metatarsal laterally and the joint positioner was placed and a small stab incision was made at the second metatarsal.  The tarsal was  realigned to a much straighter position parallel to the second metatarsal.  At this time cuts to the base of the metatarsal and distal cuneiform were performed.  The small portions of bone were removed.  The wound was flushed with copious amounts of irrigation.  The joint was prepared.  Compression was applied with the compression clamp as well as a compression olive wire.  At this time a medial locking plate was applied.  Next the dorsal locking plate was applied.  Intraoperative fluoroscopy was then used to show good realignment was noted after removal of the at this time mild residual prominence of the dorsomedial eminence was noted.  A T capsulotomy was performed.  The dorsomedial eminence was then transected with a power rasp.  The wound was flushed with copious amounts of irrigation.  A small capsulorrhaphy was performed.  Was repaired with a 3-0 Vicryl.  Attention was directed to the second toe where the dorsal contracture of the second toe was noted with pressure to the forefoot region.  A small incision about 2 cm in length was performed at the MTPJ.  Sharp and blunt dissection carried down to the capsule.  A dorsal and medial capsulotomy was performed.  The toe still sat in a slight dorsal position.  The plantar capsule was released with a McGlamry elevator.  Mild contracture of the second toe was noted at this time.  A extensor determ longus tendon lengthening was then performed and the toe sat in a much straighter position at this time.  The wound was flushed with copious amounts  of irrigation.  Final fluoroscopy showed good realignment of the first MTPJ and second toe joint as well as the second toe.  The extensor tendon was reanastomosed with a 3-0 Vicryl in a lengthened position.  The subcutaneous tissue was reanastomosed with Vicryl to all incision sites and the skin to the first metatarsal and metatarsophalangeal joint was closed with a 4-0 Monocryl with the remaining incisions closed with a 4-0  nylon.  A bulky sterile dressing was applied.  Patient was then placed in an equalizer walker boot.    Patient tolerated the procedure and anesthesia well.  Was transported from the OR to the PACU with all vital signs stable and vascular status intact. To be discharged per routine protocol.  Will follow up in approximately 1 week in the outpatient clinic.

## 2022-02-06 NOTE — Anesthesia Postprocedure Evaluation (Signed)
Anesthesia Post Note  Patient: Katrina Crawford  Procedure(s) Performed: BUNIONECTOMY (Right: Toe) CAPSULOTOMY METATARSOPHALANGEAL (Right: Foot)  Patient location during evaluation: PACU Anesthesia Type: General Level of consciousness: awake and alert Pain management: pain level controlled Vital Signs Assessment: post-procedure vital signs reviewed and stable Respiratory status: spontaneous breathing, nonlabored ventilation, respiratory function stable and patient connected to nasal cannula oxygen Cardiovascular status: blood pressure returned to baseline and stable Postop Assessment: no apparent nausea or vomiting Anesthetic complications: no   No notable events documented.   Last Vitals:  Vitals:   02/06/22 1408 02/06/22 1427  BP: 124/67 118/73  Pulse: (!) 113 (!) 108  Resp: 17 15  Temp: (!) 36.4 C (!) 36.4 C  SpO2: 95% 96%    Last Pain:  Vitals:   02/06/22 1427  TempSrc:   PainSc: 0-No pain                 Precious Haws Saad Buhl

## 2022-02-06 NOTE — Anesthesia Preprocedure Evaluation (Signed)
Anesthesia Evaluation  Patient identified by MRN, date of birth, ID band Patient awake    Reviewed: Allergy & Precautions, NPO status , Patient's Chart, lab work & pertinent test results  History of Anesthesia Complications Negative for: history of anesthetic complications  Airway Mallampati: III  TM Distance: <3 FB Neck ROM: full    Dental  (+) Chipped   Pulmonary neg pulmonary ROS, neg shortness of breath, neg sleep apnea   Pulmonary exam normal        Cardiovascular Exercise Tolerance: Good hypertension, (-) angina (-) Past MI Normal cardiovascular exam     Neuro/Psych  Neuromuscular disease  negative psych ROS   GI/Hepatic negative GI ROS, Neg liver ROS,neg GERD  ,,  Endo/Other  negative endocrine ROS    Renal/GU      Musculoskeletal   Abdominal   Peds  Hematology negative hematology ROS (+)   Anesthesia Other Findings Past Medical History: 01/22/1998: Allergy No date: Arthritis     Comment:  knees, lower back No date: Hyperlipidemia No date: Hypertension No date: Orthodontics     Comment:  invisalign  Past Surgical History: No date: APPENDECTOMY 04/29/8117: APPLICATION OF INTRAOPERATIVE CT SCAN; N/A     Comment:  Procedure: APPLICATION OF INTRAOPERATIVE CT SCAN;                Surgeon: Meade Maw, MD;  Location: [facility] OR[facility];                Service: Neurosurgery;  Laterality: N/A; 08/2013: COLONOSCOPY     Comment:  cleared for 5 yrs- Dr Allen Norris 09/04/2018: COLONOSCOPY WITH PROPOFOL; N/A     Comment:  Procedure: COLONOSCOPY WITH PROPOFOL;  Surgeon: Lucilla Lame, MD;  Location: [facility];  Service:               Endoscopy;  Laterality: N/A; 11/18/2017: JOINT REPLACEMENT 09/17/2017: REPLACEMENT TOTAL KNEE; Left 10/15/2021: TRANSFORAMINAL LUMBAR INTERBODY FUSION (TLIF) WITH PEDICLE  SCREW FIXATION 2 LEVEL; N/A     Comment:  Procedure: OPEN L4-S1 TRANSFORAMINAL LUMBAR INTERBODY                FUSION (TLIF);  Surgeon: Meade Maw, MD;                Location: [facility];  Service: Neurosurgery;  Laterality:               N/A; No date: VAGINAL HYSTERECTOMY  BMI    Body Mass Index: 33.45 kg/m      Reproductive/Obstetrics negative OB ROS                             Anesthesia Physical Anesthesia Plan  ASA: 2  Anesthesia Plan: General LMA   Post-op Pain Management:    Induction: Intravenous  PONV Risk Score and Plan: Dexamethasone, Ondansetron, Midazolam and Treatment may vary due to age or medical condition  Airway Management Planned: LMA  Additional Equipment:   Intra-op Plan:   Post-operative Plan: Extubation in OR  Informed Consent: I have reviewed the patients History and Physical, chart, labs and discussed the procedure including the risks, benefits and alternatives for the proposed anesthesia with the patient or authorized representative who has indicated his/her understanding and acceptance.     Dental Advisory Given  Plan Discussed with: Anesthesiologist, CRNA and Surgeon  Anesthesia Plan Comments: (Patient consented for risks  of anesthesia including but not limited to:  - adverse reactions to medications - damage to eyes, teeth, lips or other oral mucosa - nerve damage due to positioning  - sore throat or hoarseness - Damage to heart, brain, nerves, lungs, other parts of body or loss of life  Patient voiced understanding.)       Anesthesia Quick Evaluation

## 2022-02-06 NOTE — Transfer of Care (Signed)
Immediate Anesthesia Transfer of Care Note  Patient: Katrina Crawford  Procedure(s) Performed: BUNIONECTOMY (Right: Toe) CAPSULOTOMY METATARSOPHALANGEAL (Right: Foot)  Patient Location: PACU  Anesthesia Type: General LMA  Level of Consciousness: awake, alert  and patient cooperative  Airway and Oxygen Therapy: Patient Spontanous Breathing and Patient connected to supplemental oxygen  Post-op Assessment: Post-op Vital signs reviewed, Patient's Cardiovascular Status Stable, Respiratory Function Stable, Patent Airway and No signs of Nausea or vomiting  Post-op Vital Signs: Reviewed and stable  Complications: No notable events documented.

## 2022-02-06 NOTE — Anesthesia Procedure Notes (Signed)
Procedure Name: LMA Insertion Date/Time: 02/06/2022 12:28 PM  Performed by: Londell Moh, CRNAPre-anesthesia Checklist: Patient identified, Emergency Drugs available, Suction available, Timeout performed and Patient being monitored Patient Re-evaluated:Patient Re-evaluated prior to induction Oxygen Delivery Method: Circle system utilized Preoxygenation: Pre-oxygenation with 100% oxygen Induction Type: IV induction LMA: LMA inserted LMA Size: 4.0 Number of attempts: 1 Placement Confirmation: positive ETCO2 and breath sounds checked- equal and bilateral Tube secured with: Tape

## 2022-02-06 NOTE — H&P (Signed)
HISTORY AND PHYSICAL INTERVAL NOTE:  02/06/2022  12:14 PM  Katrina Crawford  has presented today for surgery, with the diagnosis of M20.11 - Hallux valgus of right foot M20.5X1 - Contracture of toe of right foot.  The various methods of treatment have been discussed with the patient.  No guarantees were given.  After consideration of risks, benefits and other options for treatment, the patient has consented to surgery.  I have reviewed the patients' chart and labs.     Crawford history and physical examination was performed in my office.  The patient was reexamined.  There have been no changes to this history and physical examination.  Katrina Crawford

## 2022-02-07 ENCOUNTER — Encounter: Payer: Self-pay | Admitting: Podiatry

## 2022-02-14 ENCOUNTER — Ambulatory Visit: Payer: Medicare Other | Admitting: Family Medicine

## 2022-02-14 ENCOUNTER — Encounter: Payer: Self-pay | Admitting: Family Medicine

## 2022-02-14 VITALS — BP 120/70 | HR 60 | Ht 67.0 in | Wt 220.0 lb

## 2022-02-14 DIAGNOSIS — E782 Mixed hyperlipidemia: Secondary | ICD-10-CM

## 2022-02-14 DIAGNOSIS — B009 Herpesviral infection, unspecified: Secondary | ICD-10-CM

## 2022-02-14 DIAGNOSIS — I10 Essential (primary) hypertension: Secondary | ICD-10-CM | POA: Diagnosis not present

## 2022-02-14 MED ORDER — SIMVASTATIN 40 MG PO TABS: 40.0000 mg | ORAL_TABLET | Freq: Every day | ORAL | 1 refills | Status: DC

## 2022-02-14 MED ORDER — LOSARTAN POTASSIUM 50 MG PO TABS: 50.0000 mg | ORAL_TABLET | Freq: Every day | ORAL | 1 refills | Status: DC

## 2022-02-14 MED ORDER — VALACYCLOVIR HCL 1 G PO TABS: 1000.0000 mg | ORAL_TABLET | Freq: Two times a day (BID) | ORAL | 1 refills | Status: DC

## 2022-02-14 MED ORDER — HYDROCHLOROTHIAZIDE 25 MG PO TABS: 25.0000 mg | ORAL_TABLET | Freq: Every day | ORAL | 1 refills | Status: DC

## 2022-02-14 NOTE — Progress Notes (Signed)
Date:  02/14/2022   Name:  Katrina Crawford   DOB:  1955/02/08   MRN:  [id number]   Chief Complaint: Hypertension, Hyperlipidemia, and viral syndrome  Hypertension This is a chronic problem. The current episode started more than 1 year ago. The problem has been gradually improving since onset. The problem is controlled. Pertinent negatives include no anxiety, blurred vision, chest pain, headaches, malaise/fatigue, neck pain, orthopnea, palpitations, peripheral edema, PND, shortness of breath or sweats. There are no associated agents to hypertension. Risk factors for coronary artery disease include dyslipidemia. Past treatments include angiotensin blockers and diuretics. The current treatment provides moderate improvement. There are no compliance problems.  There is no history of CVA or heart failure. There is no history of chronic renal disease.  Hyperlipidemia This is a chronic problem. The current episode started more than 1 year ago. The problem is controlled. Recent lipid tests were reviewed and are normal. She has no history of chronic renal disease, diabetes, hypothyroidism, liver disease, obesity or nephrotic syndrome. There are no known factors aggravating her hyperlipidemia. Pertinent negatives include no chest pain, focal weakness, myalgias or shortness of breath. Current antihyperlipidemic treatment includes statins. The current treatment provides moderate improvement of lipids. There are no compliance problems.     Lab Results  Component Value Date   NA 136 10/09/2021   K 3.5 10/09/2021   CO2 28 10/09/2021   GLUCOSE 95 10/09/2021   BUN 17 10/09/2021   CREATININE 0.59 10/09/2021   CALCIUM 10.0 10/09/2021   EGFR 89 02/14/2021   GFRNONAA >60 10/09/2021   Lab Results  Component Value Date   CHOL 193 08/14/2021   HDL 64 08/14/2021   LDLCALC 109 (H) 08/14/2021   TRIG 115 08/14/2021   CHOLHDL 3.5 07/01/2017   No results found for: "TSH" No results found for: "HGBA1C" Lab  Results  Component Value Date   WBC 5.7 10/09/2021   HGB 13.6 10/09/2021   HCT 39.3 10/09/2021   MCV 93.3 10/09/2021   PLT 243 10/09/2021   Lab Results  Component Value Date   ALT 15 02/14/2021   AST 17 02/14/2021   ALKPHOS 66 02/14/2021   BILITOT 0.8 02/14/2021   No results found for: "25OHVITD2", "25OHVITD3", "VD25OH"   Review of Systems  Constitutional:  Negative for chills, fever and malaise/fatigue.  HENT:  Negative for drooling, ear discharge, ear pain and sore throat.   Eyes:  Negative for blurred vision.  Respiratory:  Negative for cough, shortness of breath and wheezing.   Cardiovascular:  Negative for chest pain, palpitations, orthopnea, leg swelling and PND.  Gastrointestinal:  Negative for abdominal pain, blood in stool, constipation, diarrhea and nausea.  Endocrine: Negative for polydipsia.  Genitourinary:  Negative for dysuria, frequency, hematuria and urgency.  Musculoskeletal:  Negative for back pain, myalgias and neck pain.  Skin:  Negative for rash.  Allergic/Immunologic: Negative for environmental allergies.  Neurological:  Negative for dizziness, focal weakness and headaches.  Hematological:  Does not bruise/bleed easily.  Psychiatric/Behavioral:  Negative for suicidal ideas. The patient is not nervous/anxious.     Patient Active Problem List   Diagnosis Date Noted   Spondylolisthesis of lumbar region 10/15/2021   Acquired spondylolisthesis    Spinal instability, lumbar    Synovial cyst of lumbar facet joint    Chronic right-sided low back pain with right-sided sciatica    Family history of malignant neoplasm of gastrointestinal tract    Status post total knee replacement using cement, left 12/04/2017  Primary osteoarthritis of left knee 08/19/2017   Obesity (BMI 35.0-39.9 without comorbidity) 04/07/2017   Essential hypertension 05/02/2016   Mixed hyperlipidemia 05/02/2016   HSV-1 infection 05/02/2016    Allergies  Allergen Reactions   Codeine  Nausea Only   Penicillins Rash   Sulfa Antibiotics Rash    Past Surgical History:  Procedure Laterality Date   APPENDECTOMY     APPLICATION OF INTRAOPERATIVE CT SCAN N/A 10/15/2021   Procedure: APPLICATION OF INTRAOPERATIVE CT SCAN;  Surgeon: Meade Maw, MD;  Location: [facility];  Service: Neurosurgery;  Laterality: N/A;   BUNIONECTOMY Right 02/06/2022   Procedure: Lillard Anes;  Surgeon: Samara Deist, DPM;  Location: [place];  Service: Podiatry;  Laterality: Right;   CAPSULOTOMY METATARSOPHALANGEAL Right 02/06/2022   Procedure: CAPSULOTOMY METATARSOPHALANGEAL;  Surgeon: Samara Deist, DPM;  Location: [place];  Service: Podiatry;  Laterality: Right;   COLONOSCOPY  08/2013   cleared for 5 yrs- Dr Allen Norris   COLONOSCOPY WITH PROPOFOL N/A 09/04/2018   Procedure: COLONOSCOPY WITH PROPOFOL;  Surgeon: Lucilla Lame, MD;  Location: [place];  Service: Endoscopy;  Laterality: N/A;   JOINT REPLACEMENT  11/18/2017   REPLACEMENT TOTAL KNEE Left 09/17/2017   TRANSFORAMINAL LUMBAR INTERBODY FUSION (TLIF) WITH PEDICLE SCREW FIXATION 2 LEVEL N/A 10/15/2021   Procedure: OPEN L4-S1 TRANSFORAMINAL LUMBAR INTERBODY FUSION (TLIF);  Surgeon: Meade Maw, MD;  Location: [facility];  Service: Neurosurgery;  Laterality: N/A;   VAGINAL HYSTERECTOMY      Social History   Tobacco Use   Smoking status: Never   Smokeless tobacco: Never  Vaping Use   Vaping Use: Never used  Substance Use Topics   Alcohol use: Yes    Alcohol/week: 18.0 standard drinks of alcohol    Types: 18 Cans of beer per week   Drug use: No     Medication list has been reviewed and updated.  Current Meds  Medication Sig   Calcium Carb-Cholecalciferol (CALCIUM 600+D) 600-20 MG-MCG TABS Take 2 tablets by mouth daily.   celecoxib (CELEBREX) 200 MG capsule Take 1 capsule (200 mg total) by mouth 2 (two) times daily.   cyanocobalamin 2000 MCG tablet Take 2,000 mcg by mouth daily.   gabapentin  (NEURONTIN) 100 MG capsule Take 100 mg by mouth at bedtime.   hydrochlorothiazide (HYDRODIURIL) 25 MG tablet TAKE 1 TABLET BY MOUTH DAILY   L-Lysine 1000 MG TABS Take 2 tablets (2,000 mg total) by mouth daily. otc   losartan (COZAAR) 50 MG tablet TAKE 1 TABLET BY MOUTH DAILY   methocarbamol (ROBAXIN) 500 MG tablet Take 1 tablet (500 mg total) by mouth every 6 (six) hours as needed for muscle spasms.   mupirocin ointment (BACTROBAN) 2 % Apply 1 application topically 2 (two) times daily.   oxyCODONE-acetaminophen (PERCOCET) 5-325 MG tablet Take 1-2 tablets by mouth every 6 (six) hours as needed for severe pain. Max 6 tabs per day   senna (SENOKOT) 8.6 MG TABS tablet Take 1 tablet (8.6 mg total) by mouth 2 (two) times daily.   simvastatin (ZOCOR) 40 MG tablet TAKE 1 TABLET BY MOUTH DAILY   valACYclovir (VALTREX) 1000 MG tablet Take 1 tablet (1,000 mg total) by mouth 2 (two) times daily. As needed for breakout       02/14/2022    9:58 AM 10/26/2021    1:41 PM 09/21/2021    2:34 PM 08/14/2021   10:32 AM  GAD 7 : Generalized Anxiety Score  Nervous, Anxious, on Edge 0 0 0 0  Control/stop  worrying 0 0 0 0  Worry too much - different things 0 0 0 0  Trouble relaxing 0 0 0 0  Restless 0 0 0 0  Easily annoyed or irritable 0 0 0 0  Afraid - awful might happen 0 0 0 0  Total GAD 7 Score 0 0 0 0  Anxiety Difficulty Not difficult at all Not difficult at all Not difficult at all Not difficult at all       02/14/2022    9:58 AM 10/26/2021    1:41 PM 10/24/2021    2:53 PM  Depression screen PHQ 2/9  Decreased Interest 0 0 0  Down, Depressed, Hopeless 0 0 0  PHQ - 2 Score 0 0 0  Altered sleeping 0 0 0  Tired, decreased energy 0 0 0  Change in appetite 0 0 0  Feeling bad or failure about yourself  0 0 0  Trouble concentrating 0 0 0  Moving slowly or fidgety/restless 0 0 0  Suicidal thoughts 0 0 0  PHQ-9 Score 0 0 0  Difficult doing work/chores Not difficult at all Somewhat difficult Not difficult  at all    BP Readings from Last 3 Encounters:  02/14/22 120/70  02/06/22 118/73  01/08/22 134/84    Physical Exam Vitals and nursing note reviewed. Exam conducted with a chaperone present.  Constitutional:      General: She is not in acute distress.    Appearance: She is not diaphoretic.  HENT:     Head: Normocephalic and atraumatic.     Right Ear: Tympanic membrane and external ear normal.     Left Ear: Tympanic membrane and external ear normal.     Nose: Nose normal.     Mouth/Throat:     Mouth: Mucous membranes are moist.  Eyes:     General:        Right eye: No discharge.        Left eye: No discharge.     Conjunctiva/sclera: Conjunctivae normal.     Pupils: Pupils are equal, round, and reactive to light.  Neck:     Thyroid: No thyromegaly.     Vascular: No carotid bruit or JVD.  Cardiovascular:     Rate and Rhythm: Normal rate and regular rhythm.     Heart sounds: Normal heart sounds. No murmur heard.    No friction rub. No gallop.  Pulmonary:     Effort: Pulmonary effort is normal.     Breath sounds: Normal breath sounds. No wheezing or rhonchi.  Abdominal:     General: Bowel sounds are normal.     Palpations: Abdomen is soft. There is no mass.     Tenderness: There is no abdominal tenderness. There is no guarding.  Musculoskeletal:        General: Normal range of motion.     Cervical back: Normal range of motion and neck supple.  Lymphadenopathy:     Cervical: No cervical adenopathy.  Skin:    General: Skin is warm and dry.  Neurological:     Mental Status: She is alert.     Wt Readings from Last 3 Encounters:  02/14/22 220 lb (99.8 kg)  02/06/22 220 lb 8 oz (100 kg)  01/08/22 227 lb (103 kg)    BP 120/70   Pulse 60   Ht 5\' 7"  (1.702 m)   Wt 220 lb (99.8 kg)   SpO2 99%   BMI 34.46 kg/m   Assessment and Plan:  1.  Essential hypertension Chronic.  Controlled.  Stable.  Blood pressure today is 120/70.  Asymptomatic.  Tolerating medications  well.  Without side effects.  Will continue hydrochlorothiazide 25 mg once a day and losartan 50 mg once a day.  Will check renal function panel for GFR and electrolytes. - hydrochlorothiazide (HYDRODIURIL) 25 MG tablet; Take 1 tablet (25 mg total) by mouth daily.  Dispense: 90 tablet; Refill: 1 - losartan (COZAAR) 50 MG tablet; Take 1 tablet (50 mg total) by mouth daily.  Dispense: 90 tablet; Refill: 1 - Renal Function Panel  2. Mixed hyperlipidemia Chronic.  Controlled.  Stable.  Continue simvastatin 40 mg once a day.  Check lipid panel for current level of control. - simvastatin (ZOCOR) 40 MG tablet; Take 1 tablet (40 mg total) by mouth daily.  Dispense: 90 tablet; Refill: 1 - Lipid Panel With LDL/HDL Ratio  3. HSV-1 infection History of HSV which is controlled by valacyclovir 1 g 2 times daily as needed for breakouts. - valACYclovir (VALTREX) 1000 MG tablet; Take 1 tablet (1,000 mg total) by mouth 2 (two) times daily. As needed for breakout  Dispense: 20 tablet; Refill: 1    Otilio Miu, MD

## 2022-02-15 LAB — RENAL FUNCTION PANEL
Albumin: 4.5 g/dL (ref 3.9–4.9)
BUN/Creatinine Ratio: 19 (ref 12–28)
BUN: 13 mg/dL (ref 8–27)
CO2: 25 mmol/L (ref 20–29)
Calcium: 10 mg/dL (ref 8.7–10.3)
Chloride: 100 mmol/L (ref 96–106)
Creatinine, Ser: 0.7 mg/dL (ref 0.57–1.00)
Glucose: 102 mg/dL — ABNORMAL HIGH (ref 70–99)
Phosphorus: 3.9 mg/dL (ref 3.0–4.3)
Potassium: 4.2 mmol/L (ref 3.5–5.2)
Sodium: 140 mmol/L (ref 134–144)
eGFR: 94 mL/min/{1.73_m2} (ref >59)

## 2022-02-15 LAB — LIPID PANEL WITH LDL/HDL RATIO
Cholesterol, Total: 209 mg/dL — ABNORMAL HIGH (ref 100–199)
HDL: 51 mg/dL (ref >39)
LDL Chol Calc (NIH): 141 mg/dL — ABNORMAL HIGH (ref 0–99)
LDL/HDL Ratio: 2.8 ratio (ref 0.0–3.2)
Triglycerides: 94 mg/dL (ref 0–149)
VLDL Cholesterol Cal: 17 mg/dL (ref 5–40)

## 2022-02-18 ENCOUNTER — Other Ambulatory Visit: Payer: Self-pay

## 2022-02-18 DIAGNOSIS — E782 Mixed hyperlipidemia: Secondary | ICD-10-CM

## 2022-02-18 MED ORDER — ROSUVASTATIN CALCIUM 10 MG PO TABS: 10.0000 mg | ORAL_TABLET | Freq: Every day | ORAL | 1 refills | Status: DC

## 2022-02-18 NOTE — Progress Notes (Signed)
Sent in rosuvastatin 

## 2022-02-20 ENCOUNTER — Telehealth: Payer: Self-pay | Admitting: Orthopedic Surgery

## 2022-02-20 DIAGNOSIS — Z981 Arthrodesis status: Secondary | ICD-10-CM

## 2022-02-20 MED ORDER — CELECOXIB 200 MG PO CAPS: 200.0000 mg | ORAL_CAPSULE | Freq: Every day | ORAL | 1 refills | Status: DC | PRN

## 2022-02-20 NOTE — Telephone Encounter (Signed)
She says that she only takes it at night so she can turn in bed. This helps a lot.

## 2022-02-20 NOTE — Telephone Encounter (Signed)
From: Bernell List To: Office of Jannett Celestine, [place] Sent: 02/20/2022 2:39 PM EST Subject: Medication Renewal Request  Refills have been requested for the following medications:   celecoxib (CELEBREX) 200 MG capsule Katrina Crawford M]  Preferred pharmacy: [id number] [place], [place] Delivery method: [employer]

## 2022-02-20 NOTE — Telephone Encounter (Signed)
Got an automated refill request for celebrex.   She is 4 months out from surgery.  She can stop celebrex if she does not need it. If she still needs it, I would recommend going to just one celebrex per day as needed.   Please let me know.   Thanks!

## 2022-02-20 NOTE — Addendum Note (Signed)
Addended byGeronimo Boot on: 02/20/2022 04:28 PM   Modules accepted: Orders

## 2022-02-20 NOTE — Telephone Encounter (Signed)
Refill done for celebrex to take daily prn. Please let her know.

## 2022-02-21 NOTE — Telephone Encounter (Signed)
Patient confirmed med refill

## 2022-03-06 ENCOUNTER — Telehealth: Payer: Self-pay | Admitting: Orthopedic Surgery

## 2022-03-06 DIAGNOSIS — Z981 Arthrodesis status: Secondary | ICD-10-CM

## 2022-03-06 MED ORDER — METHOCARBAMOL 500 MG PO TABS: 500.0000 mg | ORAL_TABLET | Freq: Three times a day (TID) | ORAL | 0 refills | Status: DC | PRN

## 2022-03-06 NOTE — Telephone Encounter (Signed)
-----   Message from Peggyann Shoals sent at 03/06/2022  1:11 PM EST ----- Regarding: med refill Contact: [phone] open L4-S1 TLIF on 10/15/21 Roboxin 500mg  [facility] [place] Muscle spasms in her back. She is wearing a boot on her right foot because she had a  bunionectomy and she feels this is messing up her back up.

## 2022-03-06 NOTE — Telephone Encounter (Signed)
Robaxin sent to pharmacy. Boot likely is aggravating her back. I hope she feels better soon.

## 2022-03-12 ENCOUNTER — Other Ambulatory Visit: Payer: Self-pay | Admitting: Family Medicine

## 2022-03-12 DIAGNOSIS — E782 Mixed hyperlipidemia: Secondary | ICD-10-CM

## 2022-04-06 ENCOUNTER — Other Ambulatory Visit: Payer: Self-pay | Admitting: Family Medicine

## 2022-04-06 DIAGNOSIS — E782 Mixed hyperlipidemia: Secondary | ICD-10-CM

## 2022-04-08 ENCOUNTER — Other Ambulatory Visit: Payer: Self-pay

## 2022-04-08 NOTE — Telephone Encounter (Signed)
Requested medication (s) are due for refill today: yes  Requested medication (s) are on the active medication list: yes  Last refill:  03/13/22  Future visit scheduled: yes  Notes to clinic:  Pharmacy comment: [place]. DX Code Needed.      Requested Prescriptions  Pending Prescriptions Disp Refills   rosuvastatin (CRESTOR) 10 MG tablet [Pharmacy Med Name: ROSUVASTATIN CALCIUM 10 MG TAB] 90 tablet 1    Sig: TAKE 1 TABLET BY MOUTH EVERY DAY     Cardiovascular:  Antilipid - Statins 2 Failed - 04/06/2022  9:32 AM      Failed - Lipid Panel in normal range within the last 12 months    Cholesterol, Total  Date Value Ref Range Status  02/14/2022 209 (H) 100 - 199 mg/dL Final   LDL Chol Calc (NIH)  Date Value Ref Range Status  02/14/2022 141 (H) 0 - 99 mg/dL Final   HDL  Date Value Ref Range Status  02/14/2022 51 >39 mg/dL Final   Triglycerides  Date Value Ref Range Status  02/14/2022 94 0 - 149 mg/dL Final         Passed - Cr in normal range and within 360 days    Creatinine, Ser  Date Value Ref Range Status  02/14/2022 0.70 0.57 - 1.00 mg/dL Final         Passed - Patient is not pregnant      Passed - Valid encounter within last 12 months    Recent Outpatient Visits           1 month ago Essential hypertension   [place] [facility] [place], [place], [place]   5 months ago [place] [facility], Jesse Sans, MD   6 months ago [place] at [place], Deanna C, MD   7 months ago Essential hypertension   [place] [facility] [place], Deanna C, MD   10 months ago Dysfunction of both eustachian tubes   [place] [facility] [place], [place]un[place], MD       Future Appointments             In 4 months Juline Patch, MD  [place] [facility]

## 2022-04-21 ENCOUNTER — Other Ambulatory Visit: Payer: Self-pay | Admitting: Orthopedic Surgery

## 2022-04-21 DIAGNOSIS — Z981 Arthrodesis status: Secondary | ICD-10-CM

## 2022-04-24 ENCOUNTER — Other Ambulatory Visit: Payer: Self-pay | Admitting: Orthopedic Surgery

## 2022-04-24 DIAGNOSIS — Z981 Arthrodesis status: Secondary | ICD-10-CM

## 2022-05-09 ENCOUNTER — Encounter: Payer: Self-pay | Admitting: Family Medicine

## 2022-05-13 ENCOUNTER — Ambulatory Visit: Payer: Medicare Other | Admitting: Family Medicine

## 2022-05-13 ENCOUNTER — Encounter: Payer: Self-pay | Admitting: Family Medicine

## 2022-05-13 VITALS — BP 120/74 | HR 94 | Ht 67.0 in | Wt 223.0 lb

## 2022-05-13 DIAGNOSIS — N342 Other urethritis: Secondary | ICD-10-CM | POA: Diagnosis not present

## 2022-05-13 DIAGNOSIS — R300 Dysuria: Secondary | ICD-10-CM

## 2022-05-13 DIAGNOSIS — E782 Mixed hyperlipidemia: Secondary | ICD-10-CM

## 2022-05-13 LAB — POCT URINALYSIS DIPSTICK
Bilirubin, UA: NEGATIVE
Blood, UA: NEGATIVE
Glucose, UA: NEGATIVE
Ketones, UA: NEGATIVE
Leukocytes, UA: NEGATIVE
Nitrite, UA: NEGATIVE
Protein, UA: NEGATIVE
Spec Grav, UA: 1.01 (ref 1.010–1.025)
Urobilinogen, UA: 0.2 E.U./dL
pH, UA: 6 (ref 5.0–8.0)

## 2022-05-13 MED ORDER — NITROFURANTOIN MONOHYD MACRO 100 MG PO CAPS: 100.0000 mg | ORAL_CAPSULE | Freq: Two times a day (BID) | ORAL | 0 refills | Status: AC

## 2022-05-13 MED ORDER — NYSTATIN 100000 UNIT/GM EX CREA: 1.0000 | TOPICAL_CREAM | Freq: Two times a day (BID) | CUTANEOUS | 0 refills | Status: DC

## 2022-05-13 MED ORDER — ROSUVASTATIN CALCIUM 10 MG PO TABS: 10.0000 mg | ORAL_TABLET | Freq: Every day | ORAL | 1 refills | Status: DC

## 2022-05-13 MED ORDER — FLUCONAZOLE 150 MG PO TABS: 150.0000 mg | ORAL_TABLET | Freq: Once | ORAL | 1 refills | Status: AC

## 2022-05-13 NOTE — Progress Notes (Signed)
Date:  05/13/2022   Name:  Katrina Crawford   DOB:  Jun 08, 1955   MRN:  [id number]   Chief Complaint: Urinary Tract Infection (Symptoms started 10 days ago)  Dysuria  This is a new problem. The current episode started 1 to 4 weeks ago (10 days). The problem has been waxing and waning. The quality of the pain is described as burning. The pain is mild. There has been no fever. Associated symptoms include frequency and urgency. Pertinent negatives include no chills, discharge, flank pain, hematuria, hesitancy, nausea, sweats or vomiting. She has tried nothing (increased fluids) for the symptoms. The treatment provided mild relief. Her past medical history is significant for recurrent UTIs.  Hyperlipidemia This is a chronic problem. The current episode started more than 1 year ago. The problem is controlled. Recent lipid tests were reviewed and are normal. Pertinent negatives include no chest pain, myalgias or shortness of breath. There are no compliance problems.  Risk factors for coronary artery disease include dyslipidemia.    Lab Results  Component Value Date   NA 140 02/14/2022   K 4.2 02/14/2022   CO2 25 02/14/2022   GLUCOSE 102 (H) 02/14/2022   BUN 13 02/14/2022   CREATININE 0.70 02/14/2022   CALCIUM 10.0 02/14/2022   EGFR 94 02/14/2022   GFRNONAA >60 10/09/2021   Lab Results  Component Value Date   CHOL 209 (H) 02/14/2022   HDL 51 02/14/2022   LDLCALC 141 (H) 02/14/2022   TRIG 94 02/14/2022   CHOLHDL 3.5 07/01/2017   No results found for: "TSH" No results found for: "HGBA1C" Lab Results  Component Value Date   WBC 5.7 10/09/2021   HGB 13.6 10/09/2021   HCT 39.3 10/09/2021   MCV 93.3 10/09/2021   PLT 243 10/09/2021   Lab Results  Component Value Date   ALT 15 02/14/2021   AST 17 02/14/2021   ALKPHOS 66 02/14/2021   BILITOT 0.8 02/14/2021   No results found for: "25OHVITD2", "25OHVITD3", "VD25OH"   Review of Systems  Constitutional: Negative.  Negative for  chills, fever and unexpected weight change.  HENT:  Negative for congestion.   Respiratory:  Negative for cough, shortness of breath, wheezing and stridor.   Cardiovascular:  Negative for chest pain.  Gastrointestinal:  Negative for abdominal pain, nausea and vomiting.  Genitourinary:  Positive for dysuria, frequency and urgency. Negative for difficulty urinating, flank pain, hematuria, hesitancy, pelvic pain and vaginal discharge.  Musculoskeletal:  Negative for arthralgias, back pain and myalgias.  Skin:  Negative for rash.  Neurological:  Negative for dizziness, weakness and headaches.  Hematological:  Negative for adenopathy. Does not bruise/bleed easily.  Psychiatric/Behavioral:  Negative for dysphoric mood. The patient is not nervous/anxious.     Patient Active Problem List   Diagnosis Date Noted   Spondylolisthesis of lumbar region 10/15/2021   Acquired spondylolisthesis    Spinal instability, lumbar    Synovial cyst of lumbar facet joint    Chronic right-sided low back pain with right-sided sciatica    Family history of malignant neoplasm of gastrointestinal tract    Status post total knee replacement using cement, left 12/04/2017   Primary osteoarthritis of left knee 08/19/2017   Obesity (BMI 35.0-39.9 without comorbidity) 04/07/2017   Essential hypertension 05/02/2016   Mixed hyperlipidemia 05/02/2016   HSV-1 infection 05/02/2016    Allergies  Allergen Reactions   Codeine Nausea Only   Penicillins Rash   Sulfa Antibiotics Rash    Past Surgical History:  Procedure Laterality Date   APPENDECTOMY     APPLICATION OF INTRAOPERATIVE CT SCAN N/A 10/15/2021   Procedure: APPLICATION OF INTRAOPERATIVE CT SCAN;  Surgeon: Venetia Night, MD;  Location: [facility];  Service: Neurosurgery;  Laterality: N/A;   BUNIONECTOMY Right 02/06/2022   Procedure: Arbutus Leas;  Surgeon: Gwyneth Revels, DPM;  Location: [facility];  Service: Podiatry;  Laterality: Right;    CAPSULOTOMY METATARSOPHALANGEAL Right 02/06/2022   Procedure: CAPSULOTOMY METATARSOPHALANGEAL;  Surgeon: Gwyneth Revels, DPM;  Location: [facility];  Service: Podiatry;  Laterality: Right;   COLONOSCOPY  08/2013   cleared for 5 yrs- Dr Servando Snare   COLONOSCOPY WITH PROPOFOL N/A 09/04/2018   Procedure: COLONOSCOPY WITH PROPOFOL;  Surgeon: Midge Minium, MD;  Location: [facility];  Service: Endoscopy;  Laterality: N/A;   JOINT REPLACEMENT  11/18/2017   REPLACEMENT TOTAL KNEE Left 09/17/2017   TRANSFORAMINAL LUMBAR INTERBODY FUSION (TLIF) WITH PEDICLE SCREW FIXATION 2 LEVEL N/A 10/15/2021   Procedure: OPEN L4-S1 TRANSFORAMINAL LUMBAR INTERBODY FUSION (TLIF);  Surgeon: Venetia Night, MD;  Location: [facility];  Service: Neurosurgery;  Laterality: N/A;   VAGINAL HYSTERECTOMY      Social History   Tobacco Use   Smoking status: Never   Smokeless tobacco: Never  Vaping Use   Vaping Use: Never used  Substance Use Topics   Alcohol use: Yes    Alcohol/week: 18.0 standard drinks of alcohol    Types: 18 Cans of beer per week   Drug use: No     Medication list has been reviewed and updated.  Current Meds  Medication Sig   Calcium Carb-Cholecalciferol (CALCIUM 600+D) 600-20 MG-MCG TABS Take 2 tablets by mouth daily.   celecoxib (CELEBREX) 200 MG capsule TAKE 1 CAPSULE ( ) BY MOUTH EVERY DAY AS NEEDED   cyanocobalamin 2000 MCG tablet Take 2,000 mcg by mouth daily.   gabapentin (NEURONTIN) 100 MG capsule Take 100 mg by mouth at bedtime.   hydrochlorothiazide (HYDRODIURIL) 25 MG tablet Take 1 tablet (25 mg total) by mouth daily.   L-Lysine 1000 MG TABS Take 2 tablets (2,000 mg total) by mouth daily. otc   losartan (COZAAR) 50 MG tablet Take 1 tablet (50 mg total) by mouth daily.   mupirocin ointment (BACTROBAN) 2 % Apply 1 application topically 2 (two) times daily.   rosuvastatin (CRESTOR) 10 MG tablet TAKE 1 TABLET BY MOUTH EVERY DAY   senna (SENOKOT) 8.6 MG TABS tablet Take 1  tablet (8.6 mg total) by mouth 2 (two) times daily.   valACYclovir (VALTREX) 1000 MG tablet Take 1 tablet (1,000 mg total) by mouth 2 (two) times daily. As needed for breakout   [DISCONTINUED] methocarbamol (ROBAXIN) 500 MG tablet TAKE 1 TABLET BY MOUTH EVERY 8 HOURS AS NEEDED FOR MUSCLE SPASMS.       02/14/2022    9:58 AM 10/26/2021    1:41 PM 09/21/2021    2:34 PM 08/14/2021   10:32 AM  GAD 7 : Generalized Anxiety Score  Nervous, Anxious, on Edge 0 0 0 0  Control/stop worrying 0 0 0 0  Worry too much - different things 0 0 0 0  Trouble relaxing 0 0 0 0  Restless 0 0 0 0  Easily annoyed or irritable 0 0 0 0  Afraid - awful might happen 0 0 0 0  Total GAD 7 Score 0 0 0 0  Anxiety Difficulty Not difficult at all Not difficult at all Not difficult at all Not difficult at all  02/14/2022    9:58 AM 10/26/2021    1:41 PM 10/24/2021    2:53 PM  Depression screen PHQ 2/9  Decreased Interest 0 0 0  Down, Depressed, Hopeless 0 0 0  PHQ - 2 Score 0 0 0  Altered sleeping 0 0 0  Tired, decreased energy 0 0 0  Change in appetite 0 0 0  Feeling bad or failure about yourself  0 0 0  Trouble concentrating 0 0 0  Moving slowly or fidgety/restless 0 0 0  Suicidal thoughts 0 0 0  PHQ-9 Score 0 0 0  Difficult doing work/chores Not difficult at all Somewhat difficult Not difficult at all    BP Readings from Last 3 Encounters:  05/13/22 120/74  02/14/22 120/70  02/06/22 118/73    Physical Exam HENT:     Right Ear: Tympanic membrane and ear canal normal.     Left Ear: Tympanic membrane and ear canal normal.     Mouth/Throat:     Mouth: Mucous membranes are moist.  Eyes:     Pupils: Pupils are equal, round, and reactive to light.  Cardiovascular:     Rate and Rhythm: Normal rate and regular rhythm.     Heart sounds: No murmur heard.    No friction rub. No gallop.  Pulmonary:     Breath sounds: No wheezing, rhonchi or rales.  Abdominal:     General: Abdomen is flat. There is no  distension.     Tenderness: There is no abdominal tenderness. There is no right CVA tenderness, left CVA tenderness or guarding.  Musculoskeletal:     Cervical back: Normal range of motion.     Wt Readings from Last 3 Encounters:  05/13/22 223 lb (101.2 kg)  02/14/22 220 lb (99.8 kg)  02/06/22 220 lb 8 oz (100 kg)    BP 120/74   Pulse 94   Ht 5\' 7"  (1.702 m)   Wt 223 lb (101.2 kg)   SpO2 97%   BMI 34.93 kg/m   Assessment and Plan:  1. Dysuria New onset.  Episodic.  Patient began having dysuria prior to leaving on cruise which persisted throughout the cruise but no progression to suprapubic discomfort or flank/CVA discomfort.  Urinalysis was ordered and it was noted to be unremarkable. - POCT urinalysis dipstick  2. Urethritis New onset.  Persistent.  Stable.  Upon further questioning patient is having a dysuria involving the periurethral opening.  It is associated with frequency and urgency.  This may actually be due to either urethral-itis but more likely a possibility of candidiasis given that patient does have some exacerbation after coming home from a cruise and when she has been under heat circumstances involving sweating.  In addition to her 3-day course of nitrofurantoin in the event that we may be dealing with urethral-itis we will also initiate nystatin cream to apply it in the area of concern twice a day.  Patient is also being given a Diflucan tablet 150 mg to be taken at the end of the 3-day dosing. - nystatin cream (MYCOSTATIN); Apply 1 Application topically 2 (two) times daily.  Dispense: 30 g; Refill: 0 - nitrofurantoin, macrocrystal-monohydrate, (MACROBID) 100 MG capsule; Take 1 capsule (100 mg total) by mouth 2 (two) times daily for 7 days.  Dispense: 6 capsule; Refill: 0 - fluconazole (DIFLUCAN) 150 MG tablet; Take 1 tablet (150 mg total) by mouth once for 1 dose.  Dispense: 1 tablet; Refill: 1  3. Mixed hyperlipidemia Chronic.  Controlled.  Stable.  Currently is  tolerating rosuvastatin 10 mg once a day.  Will check lipid panel for current level of control. - rosuvastatin (CRESTOR) 10 MG tablet; Take 1 tablet (10 mg total) by mouth daily.  Dispense: 90 tablet; Refill: 1 - Lipid Panel With LDL/HDL Ratio    Elizabeth Sauer, MD

## 2022-05-14 LAB — LIPID PANEL WITH LDL/HDL RATIO
Cholesterol, Total: 191 mg/dL (ref 100–199)
HDL: 68 mg/dL (ref >39)
LDL Chol Calc (NIH): 106 mg/dL — ABNORMAL HIGH (ref 0–99)
LDL/HDL Ratio: 1.6 ratio (ref 0.0–3.2)
Triglycerides: 96 mg/dL (ref 0–149)
VLDL Cholesterol Cal: 17 mg/dL (ref 5–40)

## 2022-05-21 ENCOUNTER — Other Ambulatory Visit: Payer: Self-pay

## 2022-05-21 ENCOUNTER — Encounter: Payer: Self-pay | Admitting: Family Medicine

## 2022-05-21 DIAGNOSIS — N3090 Cystitis, unspecified without hematuria: Secondary | ICD-10-CM

## 2022-05-21 DIAGNOSIS — R300 Dysuria: Secondary | ICD-10-CM

## 2022-05-23 ENCOUNTER — Other Ambulatory Visit: Payer: Self-pay

## 2022-05-23 DIAGNOSIS — R300 Dysuria: Secondary | ICD-10-CM

## 2022-05-23 LAB — URINE CULTURE

## 2022-05-31 ENCOUNTER — Other Ambulatory Visit
Admission: RE | Admit: 2022-05-31 | Discharge: 2022-05-31 | Disposition: A | Discharge status: Home / Self Care | Payer: Medicare Other | Attending: Urology | Admitting: Urology

## 2022-05-31 ENCOUNTER — Other Ambulatory Visit: Payer: Self-pay

## 2022-05-31 ENCOUNTER — Ambulatory Visit (INDEPENDENT_AMBULATORY_CARE_PROVIDER_SITE_OTHER): Payer: Medicare Other | Admitting: Urology

## 2022-05-31 VITALS — BP 153/82 | HR 111 | Ht 67.0 in | Wt 223.0 lb

## 2022-05-31 DIAGNOSIS — R300 Dysuria: Secondary | ICD-10-CM

## 2022-05-31 DIAGNOSIS — N952 Postmenopausal atrophic vaginitis: Secondary | ICD-10-CM

## 2022-05-31 DIAGNOSIS — R8290 Unspecified abnormal findings in urine: Secondary | ICD-10-CM | POA: Insufficient documentation

## 2022-05-31 DIAGNOSIS — N368 Other specified disorders of urethra: Secondary | ICD-10-CM

## 2022-05-31 LAB — URINALYSIS, COMPLETE (UACMP) WITH MICROSCOPIC
Bilirubin Urine: NEGATIVE
Glucose, UA: NEGATIVE mg/dL
Ketones, ur: NEGATIVE mg/dL
Nitrite: NEGATIVE
Protein, ur: NEGATIVE mg/dL
Specific Gravity, Urine: 1.015 (ref 1.005–1.030)
pH: 6 (ref 5.0–8.0)

## 2022-05-31 MED ORDER — PREMARIN 0.625 MG/GM VA CREA
1.0000 | TOPICAL_CREAM | Freq: Every day | VAGINAL | 12 refills | Status: AC
Start: 2022-05-31 — End: ?

## 2022-05-31 MED ORDER — PREMARIN 0.625 MG/GM VA CREA
1.0000 | TOPICAL_CREAM | Freq: Every day | VAGINAL | 12 refills | Status: DC
Start: 2022-05-31 — End: 2022-05-31

## 2022-05-31 NOTE — Progress Notes (Incomplete)
05/31/2022 9:11 PM   Katrina Crawford 1955-07-26 [id number]  Referring provider: Duanne Limerick, MD [address] [place],  [place] [address]  Chief Complaint  Patient presents with   Establish Care   Dysuria    HPI: 67 year-old female presents today for further evaluation of dysuria.  She was seen and evaluated by her primary care on May 13, 2022, with intermittent dysuria, urgency, and frequency. Her urinalysis and urine culture were both negative at the time.  She was treated empirically with Macrobid as well as given Diflucan. She had a suspicious urinalysis in October of 2023. No culture was sent, otherwise she doesn't have any other documented UTI's. No recent upper tract imaging.  She reports the symptoms started a month ago. She thought it was normal UTI symptoms, similar to one she had last year. She went out of town and experienced intermittent pain at the opening of her urethra. When she returned she went to the PCP and was told it was not a UTI. She was thought to have urethritis and was give a 3 day course of anti-biotics, one yeast pill, and Nystatin. She had some relief but she still has burning at night. It also hurt again this morning and is burning while sitting in the exam room as well.   Results for orders placed or performed during the hospital encounter of 05/31/22  Urinalysis, Complete w Microscopic -  Result Value Ref Range   Color, Urine YELLOW YELLOW   APPearance CLEAR CLEAR   Specific Gravity, Urine 1.015 1.005 - 1.030   pH 6.0 5.0 - 8.0   Glucose, UA NEGATIVE NEGATIVE mg/dL   Hgb urine dipstick TRACE (A) NEGATIVE   Bilirubin Urine NEGATIVE NEGATIVE   Ketones, ur NEGATIVE NEGATIVE mg/dL   Protein, ur NEGATIVE NEGATIVE mg/dL   Nitrite NEGATIVE NEGATIVE   Leukocytes,Ua TRACE (A) NEGATIVE   Squamous Epithelial / HPF 0-5 0 - 5 /HPF   WBC, UA 6-10 0 - 5 WBC/hpf   RBC / HPF 6-10 0 - 5 RBC/hpf   Bacteria, UA RARE (A) NONE SEEN   Budding Yeast  PRESENT      PMH: Past Medical History:  Diagnosis Date   Allergy 01/22/1998   Arthritis    knees, lower back   Hyperlipidemia    Hypertension    Orthodontics    invisalign    Surgical History: Past Surgical History:  Procedure Laterality Date   APPENDECTOMY     APPLICATION OF INTRAOPERATIVE CT SCAN N/A 10/15/2021   Procedure: APPLICATION OF INTRAOPERATIVE CT SCAN;  Surgeon: Venetia Night, MD;  Location: [facility];  Service: Neurosurgery;  Laterality: N/A;   BUNIONECTOMY Right 02/06/2022   Procedure: Arbutus Leas;  Surgeon: Gwyneth Revels, DPM;  Location: [facility];  Service: Podiatry;  Laterality: Right;   CAPSULOTOMY METATARSOPHALANGEAL Right 02/06/2022   Procedure: CAPSULOTOMY METATARSOPHALANGEAL;  Surgeon: Gwyneth Revels, DPM;  Location: [facility];  Service: Podiatry;  Laterality: Right;   COLONOSCOPY  08/2013   cleared for 5 yrs- Dr Servando Snare   COLONOSCOPY WITH PROPOFOL N/A 09/04/2018   Procedure: COLONOSCOPY WITH PROPOFOL;  Surgeon: Midge Minium, MD;  Location: [facility];  Service: Endoscopy;  Laterality: N/A;   JOINT REPLACEMENT  11/18/2017   REPLACEMENT TOTAL KNEE Left 09/17/2017   TRANSFORAMINAL LUMBAR INTERBODY FUSION (TLIF) WITH PEDICLE SCREW FIXATION 2 LEVEL N/A 10/15/2021   Procedure: OPEN L4-S1 TRANSFORAMINAL LUMBAR INTERBODY FUSION (TLIF);  Surgeon: Venetia Night, MD;  Location: [facility];  Service:  Neurosurgery;  Laterality: N/A;   VAGINAL HYSTERECTOMY      Home Medications:  Allergies as of 05/31/2022       Reactions   Codeine Nausea Only   Penicillins Rash   Sulfa Antibiotics Rash        Medication List        Accurate as of May 31, 2022  9:11 PM. If you have any questions, ask your nurse or doctor.          STOP taking these medications    Voltaren 1 % Gel Generic drug: diclofenac Sodium Stopped by: Vanna Scotland, MD       TAKE these medications    Calcium 600+D 600-20 MG-MCG Tabs Generic drug:  Calcium Carb-Cholecalciferol Take 2 tablets by mouth daily.   celecoxib 200 MG capsule Commonly known as: CELEBREX TAKE 1 CAPSULE (200MG ) BY MOUTH EVERY DAY AS NEEDED   cyanocobalamin 2000 MCG tablet Take 2,000 mcg by mouth daily.   gabapentin 100 MG capsule Commonly known as: NEURONTIN Take 100 mg by mouth at bedtime.   hydrochlorothiazide 25 MG tablet Commonly known as: HYDRODIURIL Take 1 tablet (25 mg total) by mouth daily.   L-Lysine 1000 MG Tabs Take 2 tablets (2,000 mg total) by mouth daily. otc   losartan 50 MG tablet Commonly known as: COZAAR Take 1 tablet (50 mg total) by mouth daily.   mupirocin ointment 2 % Commonly known as: BACTROBAN Apply 1 application topically 2 (two) times daily.   nystatin cream Commonly known as: MYCOSTATIN Apply 1 Application topically 2 (two) times daily.   Premarin vaginal cream Generic drug: conjugated estrogens Place 1 Applicatorful vaginally daily. Estrogen Cream Instruction Discard applicator Apply pea sized amount to tip of finger to urethra before bed. Wash hands well after application. Use Monday, Wednesday and Friday Started by: Vanna Scotland, MD   rosuvastatin 10 MG tablet Commonly known as: CRESTOR Take 1 tablet (10 mg total) by mouth daily.   senna 8.6 MG Tabs tablet Commonly known as: SENOKOT Take 1 tablet (8.6 mg total) by mouth 2 (two) times daily.   valACYclovir 1000 MG tablet Commonly known as: VALTREX Take 1 tablet (1,000 mg total) by mouth 2 (two) times daily. As needed for breakout        Allergies:  Allergies  Allergen Reactions   Codeine Nausea Only   Penicillins Rash   Sulfa Antibiotics Rash    Family History: Family History  Problem Relation Age of Onset   Cancer Mother    Hypertension Father    Breast cancer Sister 61   ADD / ADHD Sister    Cancer Sister    Cancer Sister    Cancer Brother    Cancer Brother     Social History:  reports that she has never smoked. She has never used  smokeless tobacco. She reports current alcohol use of about 18.0 standard drinks of alcohol per week. She reports that she does not use drugs.   Physical Exam: BP (!) 153/82   Pulse (!) 111   Ht 5\' 7"  (1.702 m)   Wt 223 lb (101.2 kg)   BMI 34.93 kg/m   Constitutional:  Alert and oriented, No acute distress. HEENT: [place] AT, moist mucus membranes.  Trachea midline, no masses. GU: Pelvic exam chaperoned by CMA Umberta. Mild vaginal and periurethral atrophy. No obvious erythema, vaginal discharge, etc. She had good vaginal vault support, normal external genitalia. Neurologic: Grossly intact, no focal deficits, moving all 4 extremities. Psychiatric: Normal mood  and affect.   Urinalysis    Component Value Date/Time   COLORURINE YELLOW 05/31/2022 1405   APPEARANCEUR CLEAR 05/31/2022 1405   LABSPEC 1.015 05/31/2022 1405   PHURINE 6.0 05/31/2022 1405   GLUCOSEU NEGATIVE 05/31/2022 1405   HGBUR TRACE (A) 05/31/2022 1405   BILIRUBINUR NEGATIVE 05/31/2022 1405   BILIRUBINUR negative 05/13/2022 1131   KETONESUR NEGATIVE 05/31/2022 1405   PROTEINUR NEGATIVE 05/31/2022 1405   UROBILINOGEN 0.2 05/13/2022 1131   NITRITE NEGATIVE 05/31/2022 1405   LEUKOCYTESUR TRACE (A) 05/31/2022 1405    Lab Results  Component Value Date   BACTERIA RARE (A) 05/31/2022    Assessment & Plan:    Urethral irritation/ atrophic vaginitis  -  Will send urine for culture as well as to rule out atypical's, although do not suspect. This is likely urethral/ external issue given that her pain persists even in the absence of voiding and is primarily located at the urethra meatus.    - Plan for topical estrogen cream. She can use a pea-sized amount per urethra daily for the first week or two and then transition to three times a week. Discussed how to apply the medication and other precations.   -If she's not improved in the next month, will plan for a cystoscopy to rule out other etiologies.  Return in about 1 month  (around 07/01/2022).  I have reviewed the above documentation for accuracy and completeness, and I agree with the above.   Vanna Scotland, MD   [facility] [address] [place], [place] [address] [phone]

## 2022-06-01 LAB — URINE CULTURE: Culture: NO GROWTH

## 2022-06-06 LAB — MISC LABCORP TEST (SEND OUT): Labcorp test code: 86884

## 2022-06-17 ENCOUNTER — Other Ambulatory Visit: Payer: Self-pay | Admitting: Neurosurgery

## 2022-06-17 DIAGNOSIS — Z981 Arthrodesis status: Secondary | ICD-10-CM

## 2022-07-02 ENCOUNTER — Other Ambulatory Visit: Payer: Self-pay

## 2022-07-02 DIAGNOSIS — E782 Mixed hyperlipidemia: Secondary | ICD-10-CM

## 2022-07-02 MED ORDER — ROSUVASTATIN CALCIUM 10 MG PO TABS
10.0000 mg | ORAL_TABLET | Freq: Every day | ORAL | 0 refills | Status: DC
Start: 2022-07-02 — End: 2022-08-12

## 2022-07-29 ENCOUNTER — Other Ambulatory Visit: Payer: Self-pay | Admitting: Family Medicine

## 2022-07-29 DIAGNOSIS — N342 Other urethritis: Secondary | ICD-10-CM

## 2022-07-29 NOTE — Telephone Encounter (Signed)
Requested medication (s) are due for refill today: routing for review  Requested medication (s) are on the active medication list: yes  Last refill:  05/13/22  Future visit scheduled: yes  Notes to clinic:   Medication not assigned to a protocol, review manually.      Requested Prescriptions  Pending Prescriptions Disp Refills   nystatin cream (MYCOSTATIN) [Pharmacy Med Name: NYSTATIN 100,000 UNIT/GM CREAM] 30 g 0    Sig: APPLY TO AFFECTED AREA TWICE A DAY     Off-Protocol Failed - 07/29/2022  7:53 AM      Failed - Medication not assigned to a protocol, review manually.      Passed - Valid encounter within last 12 months    Recent Outpatient Visits           2 months ago Dysuria   [place] [facility], [facility]   5 months ago Essential hypertension   [place] [facility]   9 months ago Dysuria   [facility] at [facility], Nyoka Cowden, MD   10 months ago Cystitis   [facility]   11 months ago Essential hypertension   [place] [facility], [facility]       Future Appointments             In 2 weeks Duanne Limerick, MD [facility] at [facility], [facility], [facility]

## 2022-08-13 ENCOUNTER — Encounter: Payer: Self-pay | Admitting: Family Medicine

## 2022-08-13 ENCOUNTER — Ambulatory Visit: Payer: Medicare Other | Admitting: Family Medicine

## 2022-08-13 DIAGNOSIS — I10 Essential (primary) hypertension: Secondary | ICD-10-CM | POA: Diagnosis not present

## 2022-08-13 DIAGNOSIS — E782 Mixed hyperlipidemia: Secondary | ICD-10-CM

## 2022-08-13 DIAGNOSIS — B009 Herpesviral infection, unspecified: Secondary | ICD-10-CM | POA: Diagnosis not present

## 2022-08-13 MED ORDER — VALACYCLOVIR HCL 1 G PO TABS
1000.0000 mg | ORAL_TABLET | Freq: Two times a day (BID) | ORAL | 1 refills | Status: AC
Start: 2022-08-13 — End: ?

## 2022-08-13 MED ORDER — HYDROCHLOROTHIAZIDE 25 MG PO TABS: 25.0000 mg | ORAL_TABLET | Freq: Every day | ORAL | 1 refills | Status: DC

## 2022-08-13 MED ORDER — ROSUVASTATIN CALCIUM 10 MG PO TABS: 10.0000 mg | ORAL_TABLET | Freq: Every day | ORAL | 1 refills | Status: DC

## 2022-08-13 MED ORDER — LOSARTAN POTASSIUM 50 MG PO TABS: 50.0000 mg | ORAL_TABLET | Freq: Every day | ORAL | 1 refills | Status: DC

## 2022-08-13 NOTE — Progress Notes (Signed)
Date:  08/13/2022   Name:  Katrina Crawford   DOB:  Jan 08, 1956   MRN:  [id number]   Chief Complaint: Hyperlipidemia, Hypertension, and viral syndrome  Hyperlipidemia This is a chronic problem. The current episode started more than 1 year ago. The problem is controlled. Recent lipid tests were reviewed and are normal. She has no history of chronic renal disease. Pertinent negatives include no chest pain, focal sensory loss, leg pain or shortness of breath. Current antihyperlipidemic treatment includes statins. The current treatment provides moderate improvement of lipids. There are no compliance problems.  Risk factors for coronary artery disease include diabetes mellitus and hypertension.  Hypertension This is a chronic problem. The current episode started more than 1 year ago. The problem has been gradually improving since onset. The problem is controlled. Pertinent negatives include no blurred vision, chest pain, palpitations or shortness of breath. There are no associated agents to hypertension. Risk factors for coronary artery disease include dyslipidemia. Past treatments include diuretics and angiotensin blockers. There are no compliance problems.  There is no history of CAD/MI or CVA. There is no history of chronic renal disease, a hypertension causing med or renovascular disease.    Lab Results  Component Value Date   NA 140 02/14/2022   K 4.2 02/14/2022   CO2 25 02/14/2022   GLUCOSE 102 (H) 02/14/2022   BUN 13 02/14/2022   CREATININE 0.70 02/14/2022   CALCIUM 10.0 02/14/2022   EGFR 94 02/14/2022   GFRNONAA >60 10/09/2021   Lab Results  Component Value Date   CHOL 191 05/13/2022   HDL 68 05/13/2022   LDLCALC 106 (H) 05/13/2022   TRIG 96 05/13/2022   CHOLHDL 3.5 07/01/2017   No results found for: "TSH" No results found for: "HGBA1C" Lab Results  Component Value Date   WBC 5.7 10/09/2021   HGB 13.6 10/09/2021   HCT 39.3 10/09/2021   MCV 93.3 10/09/2021   PLT 243  10/09/2021   Lab Results  Component Value Date   ALT 15 02/14/2021   AST 17 02/14/2021   ALKPHOS 66 02/14/2021   BILITOT 0.8 02/14/2021   No results found for: "25OHVITD2", "25OHVITD3", "VD25OH"   Review of Systems  HENT:  Negative for congestion.   Eyes:  Negative for blurred vision and photophobia.  Respiratory:  Negative for cough, chest tightness, shortness of breath, wheezing and stridor.   Cardiovascular:  Negative for chest pain, palpitations and leg swelling.  Gastrointestinal:  Negative for abdominal distention, blood in stool and diarrhea.    Patient Active Problem List   Diagnosis Date Noted   Spondylolisthesis of lumbar region 10/15/2021   Acquired spondylolisthesis    Spinal instability, lumbar    Synovial cyst of lumbar facet joint    Chronic right-sided low back pain with right-sided sciatica    Family history of malignant neoplasm of gastrointestinal tract    Status post total knee replacement using cement, left 12/04/2017   Primary osteoarthritis of left knee 08/19/2017   Obesity (BMI 35.0-39.9 without comorbidity) 04/07/2017   Essential hypertension 05/02/2016   Mixed hyperlipidemia 05/02/2016   HSV-1 infection 05/02/2016    Allergies  Allergen Reactions   Codeine Nausea Only   Penicillins Rash   Sulfa Antibiotics Rash    Past Surgical History:  Procedure Laterality Date   APPENDECTOMY     APPLICATION OF INTRAOPERATIVE CT SCAN N/A 10/15/2021   Procedure: APPLICATION OF INTRAOPERATIVE CT SCAN;  Surgeon: Venetia Night, MD;  Location: [facility];  Service:  Neurosurgery;  Laterality: N/A;   BUNIONECTOMY Right 02/06/2022   Procedure: Arbutus Leas;  Surgeon: Gwyneth Revels, DPM;  Location: [facility];  Service: Podiatry;  Laterality: Right;   CAPSULOTOMY METATARSOPHALANGEAL Right 02/06/2022   Procedure: CAPSULOTOMY METATARSOPHALANGEAL;  Surgeon: Gwyneth Revels, DPM;  Location: [facility];  Service: Podiatry;  Laterality: Right;    COLONOSCOPY  08/2013   cleared for 5 yrs- Dr Servando Snare   COLONOSCOPY WITH PROPOFOL N/A 09/04/2018   Procedure: COLONOSCOPY WITH PROPOFOL;  Surgeon: Midge Minium, MD;  Location: [facility];  Service: Endoscopy;  Laterality: N/A;   JOINT REPLACEMENT  11/18/2017   REPLACEMENT TOTAL KNEE Left 09/17/2017   TRANSFORAMINAL LUMBAR INTERBODY FUSION (TLIF) WITH PEDICLE SCREW FIXATION 2 LEVEL N/A 10/15/2021   Procedure: OPEN L4-S1 TRANSFORAMINAL LUMBAR INTERBODY FUSION (TLIF);  Surgeon: Venetia Night, MD;  Location: [facility];  Service: Neurosurgery;  Laterality: N/A;   VAGINAL HYSTERECTOMY      Social History   Tobacco Use   Smoking status: Never   Smokeless tobacco: Never  Vaping Use   Vaping status: Never Used  Substance Use Topics   Alcohol use: Yes    Alcohol/week: 18.0 standard drinks of alcohol    Types: 18 Cans of beer per week   Drug use: No     Medication list has been reviewed and updated.  Current Meds  Medication Sig   Calcium Carb-Cholecalciferol (CALCIUM 600+D) 600-20 MG-MCG TABS Take 2 tablets by mouth daily.   conjugated estrogens (PREMARIN) vaginal cream Place 1 Applicatorful vaginally daily. Estrogen Cream Instruction Discard applicator Apply pea sized amount to tip of finger to urethra before bed. Wash hands well after application. Use Monday, Wednesday and Friday   cyanocobalamin 2000 MCG tablet Take 2,000 mcg by mouth daily.   hydrochlorothiazide (HYDRODIURIL) 25 MG tablet Take 1 tablet (25 mg total) by mouth daily.   L-Lysine 1000 MG TABS Take 2 tablets (2,000 mg total) by mouth daily. otc   losartan (COZAAR) 50 MG tablet Take 1 tablet (50 mg total) by mouth daily.   mupirocin ointment (BACTROBAN) 2 % Apply 1 application topically 2 (two) times daily.   nystatin cream (MYCOSTATIN) APPLY TO AFFECTED AREA TWICE A DAY   rosuvastatin (CRESTOR) 10 MG tablet Take 1 tablet (10 mg total) by mouth daily.   valACYclovir (VALTREX) 1000 MG tablet Take 1 tablet (1,000 mg  total) by mouth 2 (two) times daily. As needed for breakout   [DISCONTINUED] celecoxib (CELEBREX) 200 MG capsule TAKE 1 CAPSULE (200MG ) BY MOUTH EVERY DAY AS NEEDED   [DISCONTINUED] gabapentin (NEURONTIN) 100 MG capsule Take 100 mg by mouth at bedtime.   [DISCONTINUED] senna (SENOKOT) 8.6 MG TABS tablet Take 1 tablet (8.6 mg total) by mouth 2 (two) times daily.       08/13/2022   10:59 AM 02/14/2022    9:58 AM 10/26/2021    1:41 PM 09/21/2021    2:34 PM  GAD 7 : Generalized Anxiety Score  Nervous, Anxious, on Edge 0 0 0 0  Control/stop worrying 0 0 0 0  Worry too much - different things 0 0 0 0  Trouble relaxing 0 0 0 0  Restless 0 0 0 0  Easily annoyed or irritable 0 0 0 0  Afraid - awful might happen 0 0 0 0  Total GAD 7 Score 0 0 0 0  Anxiety Difficulty Not difficult at all Not difficult at all Not difficult at all Not difficult at all  08/13/2022   10:58 AM 02/14/2022    9:58 AM 10/26/2021    1:41 PM  Depression screen PHQ 2/9  Decreased Interest 0 0 0  Down, Depressed, Hopeless 0 0 0  PHQ - 2 Score 0 0 0  Altered sleeping 0 0 0  Tired, decreased energy 0 0 0  Change in appetite 0 0 0  Feeling bad or failure about yourself  0 0 0  Trouble concentrating 0 0 0  Moving slowly or fidgety/restless 0 0 0  Suicidal thoughts 0 0 0  PHQ-9 Score 0 0 0  Difficult doing work/chores Not difficult at all Not difficult at all Somewhat difficult    BP Readings from Last 3 Encounters:  08/13/22 120/64  05/31/22 (!) 153/82  05/13/22 120/74    Physical Exam Vitals and nursing note reviewed. Exam conducted with a chaperone present.  Constitutional:      General: She is not in acute distress.    Appearance: She is not diaphoretic.  HENT:     Head: Normocephalic and atraumatic.     Right Ear: Tympanic membrane, ear canal and external ear normal.     Left Ear: Tympanic membrane, ear canal and external ear normal.     Nose: Nose normal. No congestion or rhinorrhea.      Mouth/Throat:     Mouth: Mucous membranes are moist.  Eyes:     General:        Right eye: No discharge.        Left eye: No discharge.     Conjunctiva/sclera: Conjunctivae normal.     Pupils: Pupils are equal, round, and reactive to light.  Neck:     Thyroid: No thyromegaly.     Vascular: No JVD.  Cardiovascular:     Rate and Rhythm: Normal rate and regular rhythm.     Heart sounds: Normal heart sounds. No murmur heard.    No friction rub. No gallop.  Pulmonary:     Effort: Pulmonary effort is normal.     Breath sounds: Normal breath sounds. No wheezing, rhonchi or rales.  Abdominal:     General: Bowel sounds are normal.     Palpations: Abdomen is soft. There is no mass.     Tenderness: There is no abdominal tenderness. There is no guarding or rebound.  Musculoskeletal:     Cervical back: Normal range of motion and neck supple.  Lymphadenopathy:     Cervical: No cervical adenopathy.  Skin:    General: Skin is warm.     Coloration: Skin is not jaundiced or pale.  Neurological:     Mental Status: She is alert.     Deep Tendon Reflexes: Reflexes are normal and symmetric.     Wt Readings from Last 3 Encounters:  08/13/22 224 lb (101.6 kg)  05/31/22 223 lb (101.2 kg)  05/13/22 223 lb (101.2 kg)    BP 120/64   Pulse 97   Ht 5\' 7"  (1.702 m)   Wt 224 lb (101.6 kg)   SpO2 97%   BMI 35.08 kg/m   Assessment and Plan:  1. Essential hypertension Chronic.  Controlled.  Stable.  Blood pressure is 120/64.  Asymptomatic.  Tolerating medication well.  Continue hydrochlorothiazide 25 mg once a day and losartan 50 mg once a day.  Will evaluate comprehensive metabolic panel for electrolytes and GFR. - hydrochlorothiazide (HYDRODIURIL) 25 MG tablet; Take 1 tablet (25 mg total) by mouth daily.  Dispense: 90 tablet; Refill: 1 - losartan (COZAAR)  50 MG tablet; Take 1 tablet (50 mg total) by mouth daily.  Dispense: 90 tablet; Refill: 1 - Comprehensive Metabolic Panel (CMET)  2. Mixed  hyperlipidemia Chronic.  Controlled.  Stable.  Continue rosuvastatin 10 mg once a day. - rosuvastatin (CRESTOR) 10 MG tablet; Take 1 tablet (10 mg total) by mouth daily.  Dispense: 90 tablet; Refill: 1  3. HSV-1 infection On as-needed basis patient is to continue Valtrex 1 g twice a day for 1 day for HSV 1 outbreak. - valACYclovir (VALTREX) 1000 MG tablet; Take 1 tablet (1,000 mg total) by mouth 2 (two) times daily. As needed for breakout  Dispense: 20 tablet; Refill: 1    Elizabeth Sauer, MD

## 2022-08-14 LAB — COMPREHENSIVE METABOLIC PANEL
ALT: 10 IU/L (ref 0–32)
AST: 19 IU/L (ref 0–40)
Albumin: 4.4 g/dL (ref 3.9–4.9)
Alkaline Phosphatase: 77 IU/L (ref 44–121)
BUN/Creatinine Ratio: 21 (ref 12–28)
BUN: 15 mg/dL (ref 8–27)
Bilirubin Total: 0.7 mg/dL (ref 0.0–1.2)
CO2: 26 mmol/L (ref 20–29)
Calcium: 9.7 mg/dL (ref 8.7–10.3)
Chloride: 99 mmol/L (ref 96–106)
Creatinine, Ser: 0.73 mg/dL (ref 0.57–1.00)
Globulin, Total: 2.5 g/dL (ref 1.5–4.5)
Glucose: 90 mg/dL (ref 70–99)
Potassium: 3.8 mmol/L (ref 3.5–5.2)
Sodium: 140 mmol/L (ref 134–144)
Total Protein: 6.9 g/dL (ref 6.0–8.5)
eGFR: 90 mL/min/{1.73_m2} (ref >59)

## 2022-08-15 ENCOUNTER — Ambulatory Visit: Payer: Medicare Other | Admitting: Family Medicine

## 2022-09-17 ENCOUNTER — Other Ambulatory Visit: Payer: Self-pay | Admitting: Family Medicine

## 2022-09-17 DIAGNOSIS — Z1231 Encounter for screening mammogram for malignant neoplasm of breast: Secondary | ICD-10-CM

## 2022-10-09 ENCOUNTER — Ambulatory Visit
Admission: RE | Admit: 2022-10-09 | Discharge: 2022-10-09 | Disposition: A | Discharge status: Home / Self Care | Payer: Medicare Other | Source: Ambulatory Visit | Attending: Family Medicine | Admitting: Family Medicine

## 2022-10-09 DIAGNOSIS — Z1231 Encounter for screening mammogram for malignant neoplasm of breast: Secondary | ICD-10-CM | POA: Diagnosis present

## 2022-11-27 ENCOUNTER — Ambulatory Visit: Payer: Medicare Other

## 2022-11-27 DIAGNOSIS — Z0000 Encounter for general adult medical examination without abnormal findings: Secondary | ICD-10-CM | POA: Diagnosis not present

## 2022-11-27 NOTE — Patient Instructions (Addendum)
Ms. Dymek , Thank you for taking time to come for your Medicare Wellness Visit. I appreciate your ongoing commitment to your health goals. Please review the following plan we discussed and let me know if I can assist you in the future.   Referrals/Orders/Follow-Ups/Clinician Recommendations: none  This is a list of the screening recommended for you and due dates:  Health Maintenance  Topic Date Due   Colon Cancer Screening  09/04/2023   Medicare Annual Wellness Visit  11/27/2023   Mammogram  10/08/2024   DTaP/Tdap/Td vaccine (2 - Td or Tdap) 03/23/2025   Pneumonia Vaccine  Completed   Flu Shot  Completed   DEXA scan (bone density measurement)  Completed   COVID-19 Vaccine  Completed   Hepatitis C Screening  Completed   Zoster (Shingles) Vaccine  Completed   HPV Vaccine  Aged Out    Advanced directives: (In Chart) A copy of your advanced directives are scanned into your chart should your provider ever need it.  Next Medicare Annual Wellness Visit scheduled for next year: Yes   12/03/23 @ 10:15 am by video

## 2022-11-27 NOTE — Progress Notes (Signed)
Subjective:   Katrina Crawford is a 67 y.o. female who presents for Medicare Annual (Subsequent) preventive examination.  Visit Complete: Virtual I connected with  Katrina Crawford on 11/27/22 by a audio enabled telemedicine application and verified that I am speaking with the correct person using two identifiers.  Patient Location: Home  Provider Location: Office/Clinic  I discussed the limitations of evaluation and management by telemedicine. The patient expressed understanding and agreed to proceed.  Vital Signs: Because this visit was a virtual/telehealth visit, some criteria may be missing or patient reported. Any vitals not documented were not able to be obtained and vitals that have been documented are patient reported. .  Cardiac Risk Factors include: advanced age (>34men, >51 women);dyslipidemia;hypertension;obesity (BMI >30kg/m2)     Objective:    There were no vitals filed for this visit. There is no height or weight on file to calculate BMI.     11/27/2022   11:16 AM 02/06/2022   11:00 AM 10/24/2021    2:51 PM 10/15/2021    7:00 PM 10/15/2021   12:14 PM 10/09/2021    9:35 AM 10/23/2020    2:57 PM  Advanced Directives  Does Patient Have a Medical Advance Directive? Yes Yes   Yes Yes Yes  Type of Estate agent of [place];Living will Healthcare Power of [place];Living will Healthcare Power of [employer] of [place];Living will Living will;Healthcare Power of [employer] Power of [place];Living will  Does patient want to make changes to medical advance directive? No - Patient declined   No - Patient declined  No - Patient declined   Copy of Healthcare Power of Attorney in Chart? Yes - validated most recent copy scanned in chart (See row information) No - copy requested No - copy requested    Yes - validated most recent copy scanned in chart (See row information)    Current Medications (verified) Outpatient Encounter Medications  as of 11/27/2022  Medication Sig   Calcium Carb-Cholecalciferol (CALCIUM 600+D) 600-20 MG-MCG TABS Take 2 tablets by mouth daily.   conjugated estrogens (PREMARIN) vaginal cream Place 1 Applicatorful vaginally daily. Estrogen Cream Instruction Discard applicator Apply pea sized amount to tip of finger to urethra before bed. Wash hands well after application. Use Monday, Wednesday and Friday   cyanocobalamin 2000 MCG tablet Take 2,000 mcg by mouth daily.   hydrochlorothiazide (HYDRODIURIL) 25 MG tablet Take 1 tablet (25 mg total) by mouth daily.   L-Lysine 1000 MG TABS Take 2 tablets (2,000 mg total) by mouth daily. otc   losartan (COZAAR) 50 MG tablet Take 1 tablet (50 mg total) by mouth daily.   mupirocin ointment (BACTROBAN) 2 % Apply 1 application topically 2 (two) times daily.   nystatin cream (MYCOSTATIN) APPLY TO AFFECTED AREA TWICE A DAY   rosuvastatin (CRESTOR) 10 MG tablet Take 1 tablet (10 mg total) by mouth daily.   valACYclovir (VALTREX) 1000 MG tablet Take 1 tablet (1,000 mg total) by mouth 2 (two) times daily. As needed for breakout   No facility-administered encounter medications on file as of 11/27/2022.    Allergies (verified) Codeine, Penicillins, and Sulfa antibiotics   History: Past Medical History:  Diagnosis Date   Allergy 01/22/1998   Arthritis    knees, lower back   Hyperlipidemia    Hypertension    Orthodontics    invisalign   Past Surgical History:  Procedure Laterality Date   APPENDECTOMY     APPLICATION OF INTRAOPERATIVE CT SCAN N/A 10/15/2021  Procedure: APPLICATION OF INTRAOPERATIVE CT SCAN;  Surgeon: Venetia Night, MD;  Location: [facility];  Service: Neurosurgery;  Laterality: N/A;   BUNIONECTOMY Right 02/06/2022   Procedure: Arbutus Leas;  Surgeon: Gwyneth Revels, DPM;  Location: [facility];  Service: Podiatry;  Laterality: Right;   CAPSULOTOMY METATARSOPHALANGEAL Right 02/06/2022   Procedure: CAPSULOTOMY METATARSOPHALANGEAL;  Surgeon:  Gwyneth Revels, DPM;  Location[facility];  Service: Podiatry;  Laterality: Right;   COLONOSCOPY  08/2013   cleared for 5 yrs- Dr Servando Snare   COLONOSCOPY WITH PROPOFOL N/A 09/04/2018   Procedure: COLONOSCOPY WITH PROPOFOL;  Surgeon: Midge Minium, MD;  Location: [facility];  Service: Endoscopy;  Laterality: N/A;   JOINT REPLACEMENT  11/18/2017   REPLACEMENT TOTAL KNEE Left 09/17/2017   TRANSFORAMINAL LUMBAR INTERBODY FUSION (TLIF) WITH PEDICLE SCREW FIXATION 2 LEVEL N/A 10/15/2021   Procedure: OPEN L4-S1 TRANSFORAMINAL LUMBAR INTERBODY FUSION (TLIF);  Surgeon: Venetia Night, MD;  Location: [facility];  Service: Neurosurgery;  Laterality: N/A;   VAGINAL HYSTERECTOMY     Family History  Problem Relation Age of Onset   Cancer Mother    Hypertension Father    Breast cancer Sister 23   ADD / ADHD Sister    Cancer Sister    Cancer Sister    Cancer Brother    Cancer Brother    Social History   Socioeconomic History   Marital status: Married    Spouse name: Not on file   Number of children: 0   Years of education: Not on file   Highest education level: Bachelor's degree (e.g., BA, AB, BS)  Occupational History   Not on file  Tobacco Use   Smoking status: Never   Smokeless tobacco: Never  Vaping Use   Vaping status: Never Used  Substance and Sexual Activity   Alcohol use: Yes    Alcohol/week: 18.0 standard drinks of alcohol    Types: 18 Cans of beer per week   Drug use: No   Sexual activity: Yes    Birth control/protection: None  Other Topics Concern   Not on file  Social History Narrative   Not on file   Social Determinants of Health   Financial Resource Strain: Low Risk  (11/27/2022)   Overall Financial Resource Strain (CARDIA)    Difficulty of Paying Living Expenses: Not hard at all  Food Insecurity: No Food Insecurity (11/27/2022)   Hunger Vital Sign    Worried About Running Out of Food in the Last Year: Never true    Ran Out of Food in the Last Year:  Never true  Transportation Needs: No Transportation Needs (11/27/2022)   PRAPARE - Administrator, Civil Service (Medical): No    Lack of Transportation (Non-Medical): No  Physical Activity: Sufficiently Active (11/27/2022)   Exercise Vital Sign    Days of Exercise per Week: 6 days    Minutes of Exercise per Session: 50 min  Stress: No Stress Concern Present (11/27/2022)   [employer] of Occupational Health - Occupational Stress Questionnaire    Feeling of Stress : Not at all  Social Connections: Socially Integrated (11/27/2022)   Social Connection and Isolation Panel [NHANES]    Frequency of Communication with Friends and Family: More than three times a week    Frequency of Social Gatherings with Friends and Family: More than three times a week    Attends Religious Services: More than 4 times per year    Active Member of [employer] or Organizations: Yes  Attends Engineer, structural: More than 4 times per year    Marital Status: Married    Tobacco Counseling Counseling given: Not Answered   Clinical Intake:  Pre-visit preparation completed: Yes  Pain : No/denies pain     BMI - recorded: 35.1 Nutritional Status: BMI > 30  Obese Nutritional Risks: None Diabetes: No  How often do you need to have someone help you when you read instructions, pamphlets, or other written materials from your doctor or pharmacy?: 1 - Never  Interpreter Needed?: No  Information entered by :: Kennedy Bucker, LPN   Activities of Daily Living    11/27/2022   11:17 AM 11/23/2022   11:19 AM  In your present state of health, do you have any difficulty performing the following activities:  Hearing? 0 0  Vision? 0   Difficulty concentrating or making decisions? 0 0  Walking or climbing stairs? 0 0  Dressing or bathing? 0 0  Doing errands, shopping? 0 0  Preparing Food and eating ? N N  Using the Toilet? N N  In the past six months, have you accidently leaked urine? N N  Do  you have problems with loss of bowel control? N N  Managing your Medications? N N  Managing your Finances? N N  Housekeeping or managing your Housekeeping? N     Patient Care Team: Duanne Limerick, MD as PCP - General (Family Medicine) Irene Limbo., MD (Ophthalmology)  Indicate any recent Medical Services you may have received from other than [facility] providers in the past year (date may be approximate).     Assessment:   This is a routine wellness examination for Serita.  Hearing/Vision screen Hearing Screening - Comments:: No aids Vision Screening - Comments:: Wears glasses- Dr.Philip Bell   Goals Addressed             This Visit's Progress    DIET - EAT MORE FRUITS AND VEGETABLES         Depression Screen    11/27/2022   11:14 AM 08/13/2022   10:58 AM 02/14/2022    9:58 AM 10/26/2021    1:41 PM 10/24/2021    2:53 PM 09/21/2021    2:34 PM 08/14/2021   10:32 AM  PHQ 2/9 Scores  PHQ - 2 Score 0 0 0 0 0 0 0  PHQ- 9 Score 0 0 0 0 0 0 0    Fall Risk    11/27/2022   11:17 AM 11/23/2022   11:19 AM 08/13/2022   10:51 AM 02/14/2022    9:57 AM 10/26/2021    1:41 PM  Fall Risk   Falls in the past year? 0 0 0 0 0  Number falls in past yr: 0 0 0 0 0  Injury with Fall? 0 0 0 0 0  Risk for fall due to : No Fall Risks  No Fall Risks No Fall Risks No Fall Risks  Follow up Falls prevention discussed;Falls evaluation completed  Falls evaluation completed Falls evaluation completed Falls evaluation completed    MEDICARE RISK AT HOME: Medicare Risk at Home Any stairs in or around the home?: Yes If so, are there any without handrails?: No Home free of loose throw rugs in walkways, pet beds, electrical cords, etc?: Yes Adequate lighting in your home to reduce risk of falls?: Yes Life alert?: No Use of a cane, walker or w/c?: No Grab bars in the bathroom?: Yes Shower chair or bench in shower?: Yes Elevated toilet  seat or a handicapped toilet?: Yes  TIMED UP AND GO:  Was the  test performed?  No    Cognitive Function:        11/27/2022   11:18 AM 10/24/2021    2:54 PM  6CIT Screen  What Year? 0 points 0 points  What month? 0 points 0 points  What time? 0 points 0 points  Count back from 20 0 points 0 points  Months in reverse 0 points 0 points  Repeat phrase 0 points 2 points  Total Score 0 points 2 points    Immunizations Immunization History  Administered Date(s) Administered   Fluad Quad(high Dose 65+) 09/28/2018, 09/28/2019, 09/25/2021   Influenza Inj Mdck Quad Pf 09/29/2017   Influenza, High Dose Seasonal PF 10/01/2022   Influenza,inj,Quad PF,6+ Mos 10/06/2015, 09/24/2016   Influenza-Unspecified 09/28/2018, 09/29/2020, 09/25/2021   Moderna Covid-19 Fall Seasonal Vaccine 83yrs & older 03/29/2022, 10/01/2022   Moderna Covid-19 Vaccine Bivalent Booster 37yrs & up 05/25/2021   Moderna Sars-Covid-2 Vaccination 09/26/2020, 10/09/2021   PFIZER Comirnaty(Gray Top)Covid-19 Tri-Sucrose Vaccine 04/19/2020   PFIZER(Purple Top)SARS-COV-2 Vaccination 02/15/2019, 03/08/2019, 10/21/2019   PNEUMOCOCCAL CONJUGATE-20 01/26/2021   Pneumococcal Conjugate-13 11/20/2018   Tdap 03/24/2015   Zoster Recombinant(Shingrix) 10/25/2016, 04/22/2018    TDAP status: Up to date  Flu Vaccine status: Up to date  Pneumococcal vaccine status: Up to date  Covid-19 vaccine status: Completed vaccines  Qualifies for Shingles Vaccine? Yes   Zostavax completed No   Shingrix Completed?: Yes  Screening Tests Health Maintenance  Topic Date Due   Colonoscopy  09/04/2023   Medicare Annual Wellness (AWV)  11/27/2023   MAMMOGRAM  10/08/2024   DTaP/Tdap/Td (2 - Td or Tdap) 03/23/2025   Pneumonia Vaccine 70+ Years old  Completed   INFLUENZA VACCINE  Completed   DEXA SCAN  Completed   COVID-19 Vaccine  Completed   Hepatitis C Screening  Completed   Zoster Vaccines- Shingrix  Completed   HPV VACCINES  Aged Out    Health Maintenance  There are no preventive care reminders  to display for this patient.   Colorectal cancer screening: Type of screening: Colonoscopy. Completed 09/04/18. Repeat every 5 years  Mammogram status: Completed 10/09/22. Repeat every year  Bone Density status: Completed 11/23/20. Results reflect: Bone density results: OSTEOPENIA. Repeat every 5 years.  Lung Cancer Screening: (Low Dose CT Chest recommended if Age 21-80 years, 20 pack-year currently smoking OR have quit w/in 15years.) does not qualify.   Additional Screening:  Hepatitis C Screening: does qualify; Completed 05/02/16  Vision Screening: Recommended annual ophthalmology exams for early detection of glaucoma and other disorders of the eye. Is the patient up to date with their annual eye exam?  Yes  Who is the provider or what is the name of the office in which the patient attends annual eye exams? Dr.Bell If pt is not established with a provider, would they like to be referred to a provider to establish care? No .   Dental Screening: Recommended annual dental exams for proper oral hygiene   Community Resource Referral / Chronic Care Management: CRR required this visit?  No   CCM required this visit?  No     Plan:     I have personally reviewed and noted the following in the patient's chart:   Medical and social history Use of alcohol, tobacco or illicit drugs  Current medications and supplements including opioid prescriptions. Patient is not currently taking opioid prescriptions. Functional ability and status Nutritional status Physical activity  Advanced directives List of other physicians Hospitalizations, surgeries, and ER visits in previous 12 months Vitals Screenings to include cognitive, depression, and falls Referrals and appointments  In addition, I have reviewed and discussed with patient certain preventive protocols, quality metrics, and best practice recommendations. A written personalized care plan for preventive services as well as general  preventive health recommendations were provided to patient.     Hal Hope, LPN   52/08/[id number]   After Visit Summary: (MyChart) Due to this being a telephonic visit, the after visit summary with patients personalized plan was offered to patient via MyChart   Nurse Notes: none

## 2023-01-26 ENCOUNTER — Other Ambulatory Visit: Payer: Self-pay | Admitting: Family Medicine

## 2023-01-26 DIAGNOSIS — E782 Mixed hyperlipidemia: Secondary | ICD-10-CM

## 2023-02-04 ENCOUNTER — Other Ambulatory Visit: Payer: Self-pay | Admitting: Family Medicine

## 2023-02-04 DIAGNOSIS — I10 Essential (primary) hypertension: Secondary | ICD-10-CM

## 2023-02-05 ENCOUNTER — Other Ambulatory Visit: Payer: Self-pay | Admitting: Family Medicine

## 2023-02-05 DIAGNOSIS — I10 Essential (primary) hypertension: Secondary | ICD-10-CM

## 2023-02-05 NOTE — Telephone Encounter (Signed)
 Requested Prescriptions  Pending Prescriptions Disp Refills   losartan  (COZAAR ) 50 MG tablet [Pharmacy Med Name: Losartan  Potassium 50 MG Oral Tablet] 90 tablet 0    Sig: TAKE 1 TABLET BY MOUTH DAILY     Cardiovascular:  Angiotensin Receptor Blockers Passed - 02/05/2023  5:55 PM      Passed - Cr in normal range and within 180 days    Creatinine, Ser  Date Value Ref Range Status  08/13/2022 0.73 0.57 - 1.00 mg/dL Final         Passed - K in normal range and within 180 days    Potassium  Date Value Ref Range Status  08/13/2022 3.8 3.5 - 5.2 mmol/L Final         Passed - Patient is not pregnant      Passed - Last BP in normal range    BP Readings from Last 1 Encounters:  08/13/22 120/64         Passed - Valid encounter within last 6 months    Recent Outpatient Visits           5 months ago Essential hypertension   [place] [facility], [facility]   8 months ago Dysuria   [facility] Annal[facility]se Shoemaker Part, MD   11 months ago Essential hypertension   [place] [facility] P[facility] Moehle [facility], MD   1 year ago Dysuria   [facility] at [facility], Ch[place], MD   1 year ago Cystitis   [facility] Lacharles Altschuler Part, MD       Future Appointments             In 1 week Clarise Crooks, MD [facility] at [facility], [facility]

## 2023-02-05 NOTE — Telephone Encounter (Signed)
 Requested Prescriptions  Pending Prescriptions Disp Refills   hydrochlorothiazide  (HYDRODIURIL ) 25 MG tablet [Pharmacy Med Name: hydroCHLOROthiazide  25 MG Oral Tablet] 90 tablet 1    Sig: TAKE 1 TABLET BY MOUTH DAILY     Cardiovascular: Diuretics - Thiazide Passed - 02/05/2023  3:12 PM      Passed - Cr in normal range and within 180 days    Creatinine, Ser  Date Value Ref Range Status  08/13/2022 0.73 0.57 - 1.00 mg/dL Final         Passed - K in normal range and within 180 days    Potassium  Date Value Ref Range Status  08/13/2022 3.8 3.5 - 5.2 mmol/L Final         Passed - Na in normal range and within 180 days    Sodium  Date Value Ref Range Status  08/13/2022 140 134 - 144 mmol/L Final         Passed - Last BP in normal range    BP Readings from Last 1 Encounters:  08/13/22 120/64         Passed - Valid encounter within last 6 months    Recent Outpatient Visits           5 months ago Essential hypertension   [place] [facility], MD   8 months ago Dysuria   [facility], MD   11 months ago Essential hypertension   [place] [facility], MD   1 year ago Dysuria   [facility] at [facility], Chales [place], MD   1 year ago Cystitis   [facility] Part, MD       Future Appointments             In 1 week Clarise Crooks, MD [facility] at [facility], [facility]

## 2023-02-13 ENCOUNTER — Ambulatory Visit: Payer: Medicare Other | Admitting: Family Medicine

## 2023-02-13 ENCOUNTER — Encounter: Payer: Self-pay | Admitting: Family Medicine

## 2023-02-13 VITALS — BP 122/70 | HR 92 | Ht 67.0 in | Wt 226.0 lb

## 2023-02-13 DIAGNOSIS — E782 Mixed hyperlipidemia: Secondary | ICD-10-CM | POA: Diagnosis not present

## 2023-02-13 DIAGNOSIS — N342 Other urethritis: Secondary | ICD-10-CM | POA: Diagnosis not present

## 2023-02-13 DIAGNOSIS — I10 Essential (primary) hypertension: Secondary | ICD-10-CM

## 2023-02-13 MED ORDER — ROSUVASTATIN CALCIUM 10 MG PO TABS
10.0000 mg | ORAL_TABLET | Freq: Every day | ORAL | 1 refills | Status: AC
Start: 2023-02-13 — End: ?

## 2023-02-13 MED ORDER — LOSARTAN POTASSIUM 50 MG PO TABS
50.0000 mg | ORAL_TABLET | Freq: Every day | ORAL | 1 refills | Status: AC
Start: 2023-02-13 — End: ?

## 2023-02-13 MED ORDER — HYDROCHLOROTHIAZIDE 25 MG PO TABS
25.0000 mg | ORAL_TABLET | Freq: Every day | ORAL | 1 refills | Status: AC
Start: 2023-02-13 — End: ?

## 2023-02-13 MED ORDER — LOSARTAN POTASSIUM 50 MG PO TABS: 50.0000 mg | ORAL_TABLET | Freq: Every day | ORAL | 0 refills | Status: DC

## 2023-02-13 MED ORDER — NYSTATIN 100000 UNIT/GM EX CREA
TOPICAL_CREAM | Freq: Two times a day (BID) | CUTANEOUS | 0 refills | Status: AC
Start: 2023-02-13 — End: ?

## 2023-02-13 NOTE — Progress Notes (Signed)
Date:  02/13/2023   Name:  Katrina Crawford   DOB:  August 24, 1955   MRN:  [id number]   Chief Complaint: Hypertension and Hyperlipidemia  Hypertension This is a chronic problem. The current episode started more than 1 year ago. The problem has been gradually improving since onset. The problem is controlled. Pertinent negatives include no anxiety, blurred vision, chest pain, headaches, malaise/fatigue, neck pain, orthopnea, palpitations, peripheral edema, PND, shortness of breath or sweats. There are no associated agents to hypertension. There are no known risk factors for coronary artery disease. Past treatments include angiotensin blockers and diuretics. The current treatment provides moderate improvement. There are no compliance problems.  There is no history of CAD/MI or CVA. There is no history of chronic renal disease, a hypertension causing med or renovascular disease.  Hyperlipidemia She has no history of chronic renal disease. Pertinent negatives include no chest pain, myalgias or shortness of breath.    Lab Results  Component Value Date   NA 140 08/13/2022   K 3.8 08/13/2022   CO2 26 08/13/2022   GLUCOSE 90 08/13/2022   BUN 15 08/13/2022   CREATININE 0.73 08/13/2022   CALCIUM 9.7 08/13/2022   EGFR 90 08/13/2022   GFRNONAA >60 10/09/2021   Lab Results  Component Value Date   CHOL 191 05/13/2022   HDL 68 05/13/2022   LDLCALC 106 (H) 05/13/2022   TRIG 96 05/13/2022   CHOLHDL 3.5 07/01/2017   No results found for: "TSH" No results found for: "HGBA1C" Lab Results  Component Value Date   WBC 5.7 10/09/2021   HGB 13.6 10/09/2021   HCT 39.3 10/09/2021   MCV 93.3 10/09/2021   PLT 243 10/09/2021   Lab Results  Component Value Date   ALT 10 08/13/2022   AST 19 08/13/2022   ALKPHOS 77 08/13/2022   BILITOT 0.7 08/13/2022   No results found for: "25OHVITD2", "25OHVITD3", "VD25OH"   Review of Systems  Constitutional:  Negative for chills, fever and malaise/fatigue.  HENT:   Negative for drooling, ear discharge, ear pain and sore throat.   Eyes:  Negative for blurred vision.  Respiratory:  Negative for cough, choking, shortness of breath and wheezing.   Cardiovascular:  Negative for chest pain, palpitations, orthopnea, leg swelling and PND.  Gastrointestinal:  Negative for abdominal pain, blood in stool, constipation, diarrhea and nausea.  Endocrine: Negative for polydipsia.  Genitourinary:  Negative for dysuria, frequency, hematuria and urgency.  Musculoskeletal:  Negative for back pain, myalgias and neck pain.  Skin:  Negative for rash.  Allergic/Immunologic: Negative for environmental allergies.  Neurological:  Negative for dizziness and headaches.  Hematological:  Does not bruise/bleed easily.  Psychiatric/Behavioral:  Negative for suicidal ideas. The patient is not nervous/anxious.     Patient Active Problem List   Diagnosis Date Noted   Spondylolisthesis of lumbar region 10/15/2021   Acquired spondylolisthesis    Spinal instability, lumbar    Synovial cyst of lumbar facet joint    Chronic right-sided low back pain with right-sided sciatica    Family history of malignant neoplasm of gastrointestinal tract    Status post total knee replacement using cement, left 12/04/2017   Primary osteoarthritis of left knee 08/19/2017   Obesity (BMI 35.0-39.9 without comorbidity) 04/07/2017   Essential hypertension 05/02/2016   Mixed hyperlipidemia 05/02/2016   HSV-1 infection 05/02/2016    Allergies  Allergen Reactions   Codeine Nausea Only   Penicillins Rash   Sulfa Antibiotics Rash    Past Surgical History:  Procedure Laterality Date   APPENDECTOMY     APPLICATION OF INTRAOPERATIVE CT SCAN N/A 10/15/2021   Procedure: APPLICATION OF INTRAOPERATIVE CT SCAN;  Surgeon: Venetia Night, MD;  Location: [facility];  Service: Neurosurgery;  Laterality: N/A;   BUNIONECTOMY Right 02/06/2022   Procedure: Arbutus Leas;  Surgeon: Gwyneth Revels, DPM;  Location:  [facility];  Service: Podiatry;  Laterality: Right;   CAPSULOTOMY METATARSOPHALANGEAL Right 02/06/2022   Procedure: CAPSULOTOMY METATARSOPHALANGEAL;  Surgeon: Gwyneth Revels, DPM;  Location: [facility];  Service: Podiatry;  Laterality: Right;   COLONOSCOPY  08/2013   cleared for 5 yrs- Dr Servando Snare   COLONOSCOPY WITH PROPOFOL N/A 09/04/2018   Procedure: COLONOSCOPY WITH PROPOFOL;  Surgeon: Midge Minium, MD;  Location: [facility];  Service: Endoscopy;  Laterality: N/A;   JOINT REPLACEMENT  11/18/2017   REPLACEMENT TOTAL KNEE Left 09/17/2017   TRANSFORAMINAL LUMBAR INTERBODY FUSION (TLIF) WITH PEDICLE SCREW FIXATION 2 LEVEL N/A 10/15/2021   Procedure: OPEN L4-S1 TRANSFORAMINAL LUMBAR INTERBODY FUSION (TLIF);  Surgeon: Venetia Night, MD;  Location: [facility];  Service: Neurosurgery;  Laterality: N/A;   VAGINAL HYSTERECTOMY      Social History   Tobacco Use   Smoking status: Never   Smokeless tobacco: Never  Vaping Use   Vaping status: Never Used  Substance Use Topics   Alcohol use: Yes    Alcohol/week: 18.0 standard drinks of alcohol    Types: 18 Cans of beer per week   Drug use: No     Medication list has been reviewed and updated.  No outpatient medications have been marked as taking for the 02/13/23 encounter (Office Visit) with Duanne Limerick, MD.       02/13/2023   10:44 AM 08/13/2022   10:59 AM 02/14/2022    9:58 AM 10/26/2021    1:41 PM  GAD 7 : Generalized Anxiety Score  Nervous, Anxious, on Edge 0 0 0 0  Control/stop worrying 0 0 0 0  Worry too much - different things 0 0 0 0  Trouble relaxing 0 0 0 0  Restless 0 0 0 0  Easily annoyed or irritable 0 0 0 0  Afraid - awful might happen 0 0 0 0  Total GAD 7 Score 0 0 0 0  Anxiety Difficulty Not difficult at all Not difficult at all Not difficult at all Not difficult at all       02/13/2023   10:44 AM 11/27/2022   11:14 AM 08/13/2022   10:58 AM  Depression screen PHQ 2/9  Decreased Interest  0 0 0  Down, Depressed, Hopeless 0 0 0  PHQ - 2 Score 0 0 0  Altered sleeping 0 0 0  Tired, decreased energy 0 0 0  Change in appetite 0 0 0  Feeling bad or failure about yourself  0 0 0  Trouble concentrating 0 0 0  Moving slowly or fidgety/restless 0 0 0  Suicidal thoughts 0 0 0  PHQ-9 Score 0 0 0  Difficult doing work/chores Not difficult at all Not difficult at all Not difficult at all    BP Readings from Last 3 Encounters:  02/13/23 122/70  08/13/22 120/64  05/31/22 (!) 153/82    Physical Exam Vitals and nursing note reviewed.  Constitutional:      General: She is not in acute distress.    Appearance: She is not diaphoretic.  HENT:     Head: Normocephalic and atraumatic.     Right Ear: Tympanic membrane, ear canal  and external ear normal. There is no impacted cerumen.     Left Ear: Tympanic membrane, ear canal and external ear normal. There is no impacted cerumen.     Nose: Nose normal. No congestion or rhinorrhea.     Mouth/Throat:     Mouth: Mucous membranes are moist.     Pharynx: No oropharyngeal exudate or posterior oropharyngeal erythema.  Eyes:     General:        Right eye: No discharge.        Left eye: No discharge.     Conjunctiva/sclera: Conjunctivae normal.     Pupils: Pupils are equal, round, and reactive to light.  Neck:     Thyroid: No thyromegaly.     Vascular: No JVD.  Cardiovascular:     Rate and Rhythm: Normal rate and regular rhythm.     Heart sounds: Normal heart sounds. No murmur heard.    No friction rub. No gallop.  Pulmonary:     Effort: Pulmonary effort is normal.     Breath sounds: Normal breath sounds. No wheezing, rhonchi or rales.  Chest:     Chest wall: No tenderness.  Abdominal:     General: Bowel sounds are normal.     Palpations: Abdomen is soft. There is no mass.     Tenderness: There is no abdominal tenderness. There is no guarding.  Musculoskeletal:        General: Normal range of motion.     Cervical back: Normal  range of motion and neck supple.  Lymphadenopathy:     Cervical: No cervical adenopathy.  Skin:    General: Skin is warm and dry.  Neurological:     Mental Status: She is alert.     Deep Tendon Reflexes: Reflexes are normal and symmetric.     Wt Readings from Last 3 Encounters:  02/13/23 226 lb (102.5 kg)  08/13/22 224 lb (101.6 kg)  05/31/22 223 lb (101.2 kg)    BP 122/70   Pulse 92   Ht 5\' 7"  (1.702 m)   Wt 226 lb (102.5 kg)   SpO2 99%   BMI 35.40 kg/m   Assessment and Plan: 1. Essential hypertension (Primary) Chronic.  Controlled.  Stable.  Blood pressure today 122/70.  Asymptomatic.  Tolerating medication well.  Will obtain CMP for electrolytes and GFR.  Continue hydrochlorothiazide 25 mg once a day and losartan 50 mg once a day.  Will recheck in 6 months. - hydrochlorothiazide (HYDRODIURIL) 25 MG tablet; Take 1 tablet (25 mg total) by mouth daily.  Dispense: 90 tablet; Refill: 1 - losartan (COZAAR) 50 MG tablet; Take 1 tablet (50 mg total) by mouth daily.  Dispense: 90 tablet; Refill: 0 - Comprehensive metabolic panel  2. Mixed hyperlipidemia Chronic.  Controlled.  Stable.  Asymptomatic.  Currently tolerating rosuvastatin 10 mg once a day.  Patient has no myalgias or muscle weakness and we will continue we will check CMP for electrolytes as well as ALT AST for hepatic concerns.  Monic.  Episodic.  Asymptomatic unless she is not taking her nystatin cream. - rosuvastatin (CRESTOR) 10 MG tablet; Take 1 tablet (10 mg total) by mouth daily.  Dispense: 90 tablet; Refill: 1 - Lipid Panel With LDL/HDL Ratio  3. Urethritis Chronic.  Controlled with nystatin cream.  Will use on an as-needed basis. - nystatin cream (MYCOSTATIN); Apply topically 2 (two) times daily.  Dispense: 30 g; Refill: 0     Elizabeth Sauer, MD

## 2023-02-14 LAB — COMPREHENSIVE METABOLIC PANEL
ALT: 14 [IU]/L (ref 0–32)
AST: 19 [IU]/L (ref 0–40)
Albumin: 4.7 g/dL (ref 3.9–4.9)
Alkaline Phosphatase: 74 [IU]/L (ref 44–121)
BUN/Creatinine Ratio: 20 (ref 12–28)
BUN: 14 mg/dL (ref 8–27)
Bilirubin Total: 0.9 mg/dL (ref 0.0–1.2)
CO2: 25 mmol/L (ref 20–29)
Calcium: 10.1 mg/dL (ref 8.7–10.3)
Chloride: 101 mmol/L (ref 96–106)
Creatinine, Ser: 0.7 mg/dL (ref 0.57–1.00)
Globulin, Total: 2.5 g/dL (ref 1.5–4.5)
Glucose: 94 mg/dL (ref 70–99)
Potassium: 4.5 mmol/L (ref 3.5–5.2)
Sodium: 142 mmol/L (ref 134–144)
Total Protein: 7.2 g/dL (ref 6.0–8.5)
eGFR: 94 mL/min/{1.73_m2} (ref >59)

## 2023-02-14 LAB — LIPID PANEL WITH LDL/HDL RATIO
Cholesterol, Total: 202 mg/dL — ABNORMAL HIGH (ref 100–199)
HDL: 65 mg/dL (ref >39)
LDL Chol Calc (NIH): 119 mg/dL — ABNORMAL HIGH (ref 0–99)
LDL/HDL Ratio: 1.8 {ratio} (ref 0.0–3.2)
Triglycerides: 101 mg/dL (ref 0–149)
VLDL Cholesterol Cal: 18 mg/dL (ref 5–40)

## 2023-03-12 ENCOUNTER — Ambulatory Visit
Admission: EM | Admit: 2023-03-12 | Discharge: 2023-03-12 | Disposition: A | Discharge status: Home / Self Care | Payer: Medicare Other | Attending: Family Medicine | Admitting: Family Medicine

## 2023-03-12 ENCOUNTER — Ambulatory Visit (INDEPENDENT_AMBULATORY_CARE_PROVIDER_SITE_OTHER): Payer: Medicare Other

## 2023-03-12 DIAGNOSIS — J989 Respiratory disorder, unspecified: Secondary | ICD-10-CM | POA: Diagnosis not present

## 2023-03-12 DIAGNOSIS — J101 Influenza due to other identified influenza virus with other respiratory manifestations: Secondary | ICD-10-CM | POA: Diagnosis present

## 2023-03-12 DIAGNOSIS — R509 Fever, unspecified: Secondary | ICD-10-CM

## 2023-03-12 LAB — RESP PANEL BY RT-PCR (RSV, FLU A&B, COVID)  RVPGX2
Influenza A by PCR: POSITIVE — AB
Influenza B by PCR: NEGATIVE
Resp Syncytial Virus by PCR: NEGATIVE
SARS Coronavirus 2 by RT PCR: NEGATIVE

## 2023-03-12 LAB — GROUP A STREP BY PCR: Group A Strep by PCR: NOT DETECTED

## 2023-03-12 MED ORDER — OSELTAMIVIR PHOSPHATE 75 MG PO CAPS: 75.0000 mg | ORAL_CAPSULE | Freq: Two times a day (BID) | ORAL | 0 refills | Status: DC

## 2023-03-12 NOTE — Discharge Instructions (Addendum)
 Your chest xray is normal.  Your strep and COVID tests are negative. You have influenza  A.  Tamiflu was prescribed.  Your symptoms will gradually improve over the next 7 to 10 days.  The cough may last about 3 weeks.   Take ibuprofen  600 mg with Tylenol 1000 mg for fever, headache or body aches.   For cough: You can also use guaifenesin and dextromethorphan for cough. You can use a humidifier for chest congestion and cough.  If you don't have a humidifier, you can sit in the bathroom with the hot shower running.      For sore throat: try warm salt water gargles, Mucinex sore throat cough drops or cepacol lozenges, throat spray, warm tea or water with lemon/honey, popsicles or ice, or OTC cold relief medicine for throat discomfort. You can also purchase chloraseptic spray at the pharmacy or dollar store.   For congestion: take a daily anti-histamine like Zyrtec, Claritin, and a oral decongestant, such as pseudoephedrine.  You can also use Flonase 1-2 sprays in each nostril daily. Afrin is also a good option, if you do not have high blood pressure.    It is important to stay hydrated: drink plenty of fluids (water, gatorade/powerade/pedialyte, juices, or teas) to keep your throat moisturized and help further relieve irritation/discomfort.    Return or go to the Emergency Department if symptoms worsen or do not improve in the next few days

## 2023-03-12 NOTE — ED Triage Notes (Signed)
 Sx x 5 days  Cough Runny nose Chest congestion Fever started this morning Sore throat

## 2023-03-12 NOTE — ED Provider Notes (Signed)
 [facility]    CSN: [id number] Arrival date & time: 03/12/23  1139      History   Chief Complaint Chief Complaint  Patient presents with   Cough   Sore Throat   Fever   Nasal Congestion    HPI Katrina Crawford is a 68 y.o. female.   HPI  History obtained from  patient . Katrina Crawford presents for rhinorrhea, fever and cough that started late last week.  Fever started today. Tmax 100.63F.  Took Nyquil and Mucinex.  No one else is sick.  No vomiting or diarrhea.  Endorses sore throat. Denies headache.     No history of asthma. Denies smoking and vaping.      Past Medical History:  Diagnosis Date   Allergy 01/22/1998   Arthritis    knees, lower back   Hyperlipidemia    Hypertension    Orthodontics    invisalign    Patient Active Problem List   Diagnosis Date Noted   Spondylolisthesis of lumbar region 10/15/2021   Acquired spondylolisthesis    Spinal instability, lumbar    Synovial cyst of lumbar facet joint    Chronic right-sided low back pain with right-sided sciatica    Family history of malignant neoplasm of gastrointestinal tract    Status post total knee replacement using cement, left 12/04/2017   Primary osteoarthritis of left knee 08/19/2017   Obesity (BMI 35.0-39.9 without comorbidity) 04/07/2017   Essential hypertension 05/02/2016   Mixed hyperlipidemia 05/02/2016   HSV-1 infection 05/02/2016    Past Surgical History:  Procedure Laterality Date   APPENDECTOMY     APPLICATION OF INTRAOPERATIVE CT SCAN N/A 10/15/2021   Procedure: APPLICATION OF INTRAOPERATIVE CT SCAN;  Surgeon: Venetia Night, MD;  Location: [facility];  Service: Neurosurgery;  Laterality: N/A;   BUNIONECTOMY Right 02/06/2022   Procedure: Arbutus Leas;  Surgeon: Gwyneth Revels, DPM;  Location: [facility];  Service: Podiatry;  Laterality: Right;   CAPSULOTOMY METATARSOPHALANGEAL Right 02/06/2022   Procedure: CAPSULOTOMY METATARSOPHALANGEAL;  Surgeon: Gwyneth Revels,  DPM;  Location: [facility];  Service: Podiatry;  Laterality: Right;   COLONOSCOPY  08/2013   cleared for 5 yrs- Dr Servando Snare   COLONOSCOPY WITH PROPOFOL N/A 09/04/2018   Procedure: COLONOSCOPY WITH PROPOFOL;  Surgeon: Midge Minium, MD;  Location: [facility];  Service: Endoscopy;  Laterality: N/A;   JOINT REPLACEMENT  11/18/2017   REPLACEMENT TOTAL KNEE Left 09/17/2017   TRANSFORAMINAL LUMBAR INTERBODY FUSION (TLIF) WITH PEDICLE SCREW FIXATION 2 LEVEL N/A 10/15/2021   Procedure: OPEN L4-S1 TRANSFORAMINAL LUMBAR INTERBODY FUSION (TLIF);  Surgeon: Venetia Night, MD;  Location: [facility];  Service: Neurosurgery;  Laterality: N/A;   VAGINAL HYSTERECTOMY      OB History   No obstetric history on file.      Home Medications    Prior to Admission medications   Medication Sig Start Date End Date Taking? Authorizing Provider  hydrochlorothiazide (HYDRODIURIL) 25 MG tablet Take 1 tablet (25 mg total) by mouth daily. 02/13/23  Yes Duanne Limerick, MD  losartan (COZAAR) 50 MG tablet Take 1 tablet (50 mg total) by mouth daily. 02/13/23  Yes Duanne Limerick, MD  oseltamivir (TAMIFLU) 75 MG capsule Take 1 capsule (75 mg total) by mouth every 12 (twelve) hours. 03/12/23  Yes Aerielle Stoklosa, DO  rosuvastatin (CRESTOR) 10 MG tablet Take 1 tablet (10 mg total) by mouth daily. 02/13/23  Yes Duanne Limerick, MD  Calcium Carb-Cholecalciferol (CALCIUM 600+D) 600-20 MG-MCG TABS Take 2 tablets  by mouth daily.    [provider]  conjugated estrogens (PREMARIN) vaginal cream Place 1 Applicatorful vaginally daily. Estrogen Cream Instruction Discard applicator Apply pea sized amount to tip of finger to urethra before bed. Wash hands well after application. Use Monday, Wednesday and Friday 05/31/22   Vanna Scotland, MD  cyanocobalamin 2000 MCG tablet Take 2,000 mcg by mouth daily.    [provider]  L-Lysine 1000 MG TABS Take 2 tablets (2,000 mg total) by mouth daily. otc 07/01/17    Duanne Limerick, MD  mupirocin ointment (BACTROBAN) 2 % Apply 1 application topically 2 (two) times daily. 08/09/20   [provider]  nystatin cream (MYCOSTATIN) Apply topically 2 (two) times daily. 02/13/23   Duanne Limerick, MD  valACYclovir (VALTREX) 1000 MG tablet Take 1 tablet (1,000 mg total) by mouth 2 (two) times daily. As needed for breakout 08/13/22   Duanne Limerick, MD    Family History Family History  Problem Relation Age of Onset   Cancer Mother    Hypertension Father    Breast cancer Sister 52   ADD / ADHD Sister    Cancer Sister    Cancer Sister    Cancer Brother    Cancer Brother     Social History Social History   Tobacco Use   Smoking status: Never   Smokeless tobacco: Never  Vaping Use   Vaping status: Never Used  Substance Use Topics   Alcohol use: Yes    Alcohol/week: 18.0 standard drinks of alcohol    Types: 18 Cans of beer per week   Drug use: No     Allergies   Codeine, Penicillins, and Sulfa antibiotics   Review of Systems Review of Systems: negative unless otherwise stated in HPI.      Physical Exam Triage Vital Signs ED Triage Vitals [03/12/23 1159]  Encounter Vitals Group     BP (!) 167/84     Systolic BP Percentile      Diastolic BP Percentile      Pulse Rate 99     Resp 17     Temp 99.4 F (37.4 C)     Temp Source Oral     SpO2 99 %     Weight      Height      Head Circumference      Peak Flow      Pain Score 0     Pain Loc      Pain Education      Exclude from Growth Chart    No data found.  Updated Vital Signs BP (!) 167/84 (BP Location: Left Arm)   Pulse 99   Temp 99.4 F (37.4 C) (Oral)   Resp 17   SpO2 99%   Visual Acuity Right Eye Distance:   Left Eye Distance:   Bilateral Distance:    Right Eye Near:   Left Eye Near:    Bilateral Near:     Physical Exam GEN:     alert, non-toxic appearing female in no distress    HENT:  mucus membranes moist, oropharyngeal without lesions or erythema,  no tonsillar hypertrophy or exudates,  moderate erythematous edematous turbinates, clear nasal discharge EYES:   pupils equal and reactive, no scleral injection or discharge NECK:  normal ROM, no lymphadenopathy, no meningismus   RESP:  no increased work of breathing, clear to auscultation bilaterally CVS:   regular rate and rhythm Skin:   warm and dry, no rash on  visible skin    UC Treatments / Results  Labs (all labs ordered are listed, but only abnormal results are displayed) Labs Reviewed  RESP PANEL BY RT-PCR (RSV, FLU A&B, COVID)  RVPGX2 - Abnormal; Notable for the following components:      Result Value   Influenza A by PCR POSITIVE (*)    All other components within normal limits  GROUP A STREP BY PCR    EKG   Radiology DG Chest 2 View Result Date: 03/12/2023 CLINICAL DATA:  Cough and fever for 5 days. EXAM: CHEST - 2 VIEW COMPARISON:  February 25, 2008. FINDINGS: Stable cardiomediastinal silhouette. Both lungs are clear. The visualized skeletal structures are unremarkable. IMPRESSION: No active cardiopulmonary disease. Electronically Signed   By: Lupita Raider M.D.   On: 03/12/2023 13:23     Procedures Procedures (including critical care time)  Medications Ordered in UC Medications - No data to display  Initial Impression / Assessment and Plan / UC Course  I have reviewed the triage vital signs and the nursing notes.  Pertinent labs & imaging results that were available during my care of the patient were reviewed by me and considered in my medical decision making (see chart for details).       Pt is a 68 y.o. female who presents for new fever with ongoing respiratory symptoms. Gerianne has an elevated temperature here of 99.26F.  She is hypertensive at 167/84 as well.Vanita Panda well on room air. Overall pt is non-toxic appearing, well hydrated, without respiratory distress. Pulmonary exam is unremarkable.  Given her fever and respiratory symptoms recommended chest  x-ray to evaluate for pneumonia.  Patient is agreeable to this. Chest xray personally reviewed by me without focal pneumonia, pleural effusion, cardiomegaly or pneumothorax.  COVID and influenza panel obtained and was influenza A positive.  Given that she just started having fevers I am unsure what her timeline is for Tamiflu.  Discussed Tamiflu risk and benefits with the patient and she would like to proceed with prescription.  Tamiflu prescribed as below.  Radiologist impression reviewed.  Chest xray personally reviewed by me without focal pneumonia, pleural effusion, cardiomegaly or pneumothorax.  Radiologist impression reviewed.  Return and ED precautions given and voiced understanding. Discussed MDM, treatment plan and plan for follow-up with patient who agrees with plan.     Final Clinical Impressions(s) / UC Diagnoses   Final diagnoses:  Respiratory illness with fever  Influenza A with respiratory manifestations     Discharge Instructions      Your chest xray is normal.  Your strep and COVID tests are negative. You have influenza  A.  Tamiflu was prescribed.  Your symptoms will gradually improve over the next 7 to 10 days.  The cough may last about 3 weeks.   Take ibuprofen  600 mg with Tylenol 1000 mg for fever, headache or body aches.   For cough: You can also use guaifenesin and dextromethorphan for cough. You can use a humidifier for chest congestion and cough.  If you don't have a humidifier, you can sit in the bathroom with the hot shower running.      For sore throat: try warm salt water gargles, Mucinex sore throat cough drops or cepacol lozenges, throat spray, warm tea or water with lemon/honey, popsicles or ice, or OTC cold relief medicine for throat discomfort. You can also purchase chloraseptic spray at the pharmacy or dollar store.   For congestion: take a daily anti-histamine like Zyrtec, Claritin, and  a oral decongestant, such as pseudoephedrine.  You can also use  Flonase 1-2 sprays in each nostril daily. Afrin is also a good option, if you do not have high blood pressure.    It is important to stay hydrated: drink plenty of fluids (water, gatorade/powerade/pedialyte, juices, or teas) to keep your throat moisturized and help further relieve irritation/discomfort.    Return or go to the Emergency Department if symptoms worsen or do not improve in the next few days      ED Prescriptions     Medication Sig Dispense Auth. Provider   oseltamivir (TAMIFLU) 75 MG capsule Take 1 capsule (75 mg total) by mouth every 12 (twelve) hours. 10 capsule Katha Cabal, DO      PDMP not reviewed this encounter.   Katha Cabal, DO 03/17/23 1106

## 2023-07-10 ENCOUNTER — Encounter: Payer: Self-pay | Admitting: Emergency Medicine

## 2023-07-10 ENCOUNTER — Ambulatory Visit
Admission: EM | Admit: 2023-07-10 | Discharge: 2023-07-10 | Disposition: A | Discharge status: Home / Self Care | Attending: Emergency Medicine | Admitting: Emergency Medicine

## 2023-07-10 ENCOUNTER — Ambulatory Visit (INDEPENDENT_AMBULATORY_CARE_PROVIDER_SITE_OTHER)

## 2023-07-10 DIAGNOSIS — R051 Acute cough: Secondary | ICD-10-CM

## 2023-07-10 DIAGNOSIS — J209 Acute bronchitis, unspecified: Secondary | ICD-10-CM

## 2023-07-10 LAB — SARS CORONAVIRUS 2 BY RT PCR: SARS Coronavirus 2 by RT PCR: NEGATIVE

## 2023-07-10 MED ORDER — BENZONATATE 100 MG PO CAPS: 200.0000 mg | ORAL_CAPSULE | Freq: Three times a day (TID) | ORAL | 0 refills | Status: AC

## 2023-07-10 MED ORDER — DOXYCYCLINE HYCLATE 100 MG PO CAPS: 100.0000 mg | ORAL_CAPSULE | Freq: Two times a day (BID) | ORAL | 0 refills | Status: AC

## 2023-07-10 MED ORDER — AEROCHAMBER MV MISC: 2 refills | Status: AC

## 2023-07-10 MED ORDER — IPRATROPIUM BROMIDE 0.06 % NA SOLN: 2.0000 | Freq: Four times a day (QID) | NASAL | 12 refills | Status: AC

## 2023-07-10 MED ORDER — ALBUTEROL SULFATE HFA 108 (90 BASE) MCG/ACT IN AERS: 2.0000 | INHALATION_SPRAY | RESPIRATORY_TRACT | 0 refills | Status: AC | PRN

## 2023-07-10 MED ORDER — PROMETHAZINE-DM 6.25-15 MG/5ML PO SYRP: 5.0000 mL | ORAL_SOLUTION | Freq: Four times a day (QID) | ORAL | 0 refills | Status: DC | PRN

## 2023-07-10 NOTE — Discharge Instructions (Addendum)
 Your chest x-ray did not show any evidence of pneumonia.  I am going to treat you for bronchitis.  Take the doxycycline  twice daily with food for 7 days for treatment of bronchitis.  Use the albuterol inhaler, with the spacer, and take 1 to 2 puffs every 4-6 hours as needed for shortness of breath or wheezing.  Use over-the-counter Tylenol  and/or ibuprofen Corda pack instructions as needed for any fever or pain.  Use the Atrovent nasal spray, 2 squirts in each nostril every 6 hours, as needed for runny nose and postnasal drip.  Use the Tessalon Perles every 8 hours during the day.  Take them with a small sip of water .  They may give you some numbness to the base of your tongue or a metallic taste in your mouth, this is normal.  Use the Promethazine  DM cough syrup at bedtime for cough and congestion.  It will make you drowsy so do not take it during the day.  Return for reevaluation or see your primary care provider for any new or worsening symptoms.

## 2023-07-10 NOTE — ED Triage Notes (Addendum)
 Pt c/o cough, chest congestion x4days  Pt states that she has only had a cough and denies temperature or nasal congestion  Pt states that symptoms are worse at night.

## 2023-07-10 NOTE — ED Provider Notes (Signed)
 [facility]    CSN: [id number] Arrival date & time: 07/10/23  0804      History   Chief Complaint Chief Complaint  Patient presents with   Cough    HPI Katrina Crawford is a 68 y.o. female.   HPI  68 year old female with past medical history significant for hypertension, hyperlipidemia, arthritis, and spondylolisthesis of the lumbar region presents for evaluation of cough and chest congestion that been going on for last 4 days.  Her cough is worse at night and she does report that she is bringing up yellow sputum.  She only has shortness of breath and wheezing at nighttime and this will wake her up from sleep.  She denies any fever, runny nose, nasal Cancian, sore throat, or ear pain.  Her husband had symptoms similar that started 4 days prior to hers.  She was also recently in the ICU with her sister who is dying of stage IV metastatic breast cancer and aspiration pneumonia.  Her sister was coughing and was not intubated.  Patient was not wearing a mask while at bedside.  Past Medical History:  Diagnosis Date   Allergy 01/22/1998   Arthritis    knees, lower back   Hyperlipidemia    Hypertension    Orthodontics    invisalign    Patient Active Problem List   Diagnosis Date Noted   Spondylolisthesis of lumbar region 10/15/2021   Acquired spondylolisthesis    Spinal instability, lumbar    Synovial cyst of lumbar facet joint    Chronic right-sided low back pain with right-sided sciatica    Family history of malignant neoplasm of gastrointestinal tract    Status post total knee replacement using cement, left 12/04/2017   Primary osteoarthritis of left knee 08/19/2017   Obesity (BMI 35.0-39.9 without comorbidity) 04/07/2017   Essential hypertension 05/02/2016   Mixed hyperlipidemia 05/02/2016   HSV-1 infection 05/02/2016    Past Surgical History:  Procedure Laterality Date   APPENDECTOMY     APPLICATION OF INTRAOPERATIVE CT SCAN N/A 10/15/2021   Procedure:  APPLICATION OF INTRAOPERATIVE CT SCAN;  Surgeon: Jodeen Munch, MD;  Location: [facility];  Service: Neurosurgery;  Laterality: N/A;   BUNIONECTOMY Right 02/06/2022   Procedure: Tillman Folks;  Surgeon: Anell Baptist, DPM;  Location: [facility];  Service: Podiatry;  Laterality: Right;   CAPSULOTOMY METATARSOPHALANGEAL Right 02/06/2022   Procedure: CAPSULOTOMY METATARSOPHALANGEAL;  Surgeon: Anell Baptist, DPM;  Location: [facility];  Service: Podiatry;  Laterality: Right;   COLONOSCOPY  08/2013   cleared for 5 yrs- Dr Ole Berkeley   COLONOSCOPY WITH PROPOFOL  N/A 09/04/2018   Procedure: COLONOSCOPY WITH PROPOFOL ;  Surgeon: Marnee Sink, MD;  Location: [facility];  Service: Endoscopy;  Laterality: N/A;   JOINT REPLACEMENT  11/18/2017   REPLACEMENT TOTAL KNEE Left 09/17/2017   TRANSFORAMINAL LUMBAR INTERBODY FUSION (TLIF) WITH PEDICLE SCREW FIXATION 2 LEVEL N/A 10/15/2021   Procedure: OPEN L4-S1 TRANSFORAMINAL LUMBAR INTERBODY FUSION (TLIF);  Surgeon: Jodeen Munch, MD;  Location: [facility];  Service: Neurosurgery;  Laterality: N/A;   VAGINAL HYSTERECTOMY      OB History   No obstetric history on file.      Home Medications    Prior to Admission medications   Medication Sig Start Date End Date Taking? Authorizing Provider  albuterol (VENTOLIN HFA) 108 (90 Base) MCG/ACT inhaler Inhale 2 puffs into the lungs every 4 (four) hours as needed. 07/10/23  Yes Kent Pear, NP  benzonatate (TESSALON) 100 MG capsule Take 2  capsules (200 mg total) by mouth every 8 (eight) hours. 07/10/23  Yes Kent Pear, NP  Calcium  Carb-Cholecalciferol (CALCIUM  600+D) 600-20 MG-MCG TABS Take 2 tablets by mouth daily.   Yes [provider]  conjugated estrogens  (PREMARIN ) vaginal cream Place 1 Applicatorful vaginally daily. Estrogen Cream Instruction Discard applicator Apply pea sized amount to tip of finger to urethra before bed. Wash hands well after application. Use Monday, Wednesday  and Friday 05/31/22  Yes Dustin Gimenez, MD  cyanocobalamin 2000 MCG tablet Take 2,000 mcg by mouth daily.   Yes [provider]  doxycycline  (VIBRAMYCIN ) 100 MG capsule Take 1 capsule (100 mg total) by mouth 2 (two) times daily for 7 days. 07/10/23 07/17/23 Yes Kent Pear, NP  hydrochlorothiazide  (HYDRODIURIL ) 25 MG tablet Take 1 tablet (25 mg total) by mouth daily. 02/13/23  Yes Jones, Deanna C, MD  ipratropium (ATROVENT) 0.06 % nasal spray Place 2 sprays into both nostrils 4 (four) times daily. 07/10/23  Yes Kent Pear, NP  L-Lysine  1000 MG TABS Take 2 tablets (2,000 mg total) by mouth daily. otc 07/01/17  Yes Clarise Crooks, MD  losartan  (COZAAR ) 50 MG tablet Take 1 tablet (50 mg total) by mouth daily. 02/13/23  Yes Clarise Crooks, MD  nystatin  cream (MYCOSTATIN ) Apply topically 2 (two) times daily. 02/13/23  Yes Clarise Crooks, MD  promethazine -dextromethorphan (PROMETHAZINE -DM) 6.25-15 MG/5ML syrup Take 5 mLs by mouth 4 (four) times daily as needed. 07/10/23  Yes Kent Pear, NP  rosuvastatin  (CRESTOR ) 10 MG tablet Take 1 tablet (10 mg total) by mouth daily. 02/13/23  Yes Clarise Crooks, MD  Spacer/Aero-Holding Chambers (AEROCHAMBER MV) inhaler Use as instructed 07/10/23  Yes Kent Pear, NP  valACYclovir  (VALTREX ) 1000 MG tablet Take 1 tablet (1,000 mg total) by mouth 2 (two) times daily. As needed for breakout 08/13/22  Yes Clarise Crooks, MD    Family History Family History  Problem Relation Age of Onset   Cancer Mother    Hypertension Father    Breast cancer Sister 62   ADD / ADHD Sister    Cancer Sister    Cancer Sister    Cancer Brother    Cancer Brother     Social History Social History   Tobacco Use   Smoking status: Never   Smokeless tobacco: Never  Vaping Use   Vaping status: Never Used  Substance Use Topics   Alcohol use: Yes    Alcohol/week: 18.0 standard drinks of alcohol    Types: 18 Cans of beer per week   Drug use: No     Allergies   Codeine,  Penicillins, and Sulfa antibiotics   Review of Systems Review of Systems  Constitutional:  Negative for fever.  HENT:  Negative for congestion, ear pain, rhinorrhea and sore throat.   Respiratory:  Positive for cough, shortness of breath and wheezing.      Physical Exam Triage Vital Signs ED Triage Vitals  Encounter Vitals Group     BP      Girls Systolic BP Percentile      Girls Diastolic BP Percentile      Boys Systolic BP Percentile      Boys Diastolic BP Percentile      Pulse      Resp      Temp      Temp src      SpO2      Weight      Height      Head Circumference  Peak Flow      Pain Score      Pain Loc      Pain Education      Exclude from Growth Chart    No data found.  Updated Vital Signs BP (!) 157/92 (BP Location: Left Arm)   Pulse 76   Temp 99 F (37.2 C) (Oral)   Ht 5' 8 (1.727 m)   Wt 220 lb (99.8 kg)   SpO2 98%   BMI 33.45 kg/m   Visual Acuity Right Eye Distance:   Left Eye Distance:   Bilateral Distance:    Right Eye Near:   Left Eye Near:    Bilateral Near:     Physical Exam Vitals and nursing note reviewed.  Constitutional:      Appearance: Normal appearance. She is not ill-appearing.  HENT:     Head: Normocephalic and atraumatic.     Nose: Congestion and rhinorrhea present.     Comments: This mucosa is edematous and erythematous with scant clear discharge in both nares.    Mouth/Throat:     Mouth: Mucous membranes are moist.     Pharynx: Oropharynx is clear. Posterior oropharyngeal erythema present. No oropharyngeal exudate.     Comments: Posterior oropharynx is erythematous and injected with clear postnasal drip.  Cardiovascular:     Rate and Rhythm: Normal rate and regular rhythm.     Pulses: Normal pulses.     Heart sounds: Normal heart sounds. No murmur heard.    No friction rub. No gallop.  Pulmonary:     Effort: Pulmonary effort is normal.     Breath sounds: Normal breath sounds. No wheezing, rhonchi or rales.    Musculoskeletal:     Cervical back: Normal range of motion and neck supple. No tenderness.  Lymphadenopathy:     Cervical: No cervical adenopathy.   Skin:    General: Skin is warm and dry.     Capillary Refill: Capillary refill takes less than 2 seconds.     Findings: No rash.   Neurological:     General: No focal deficit present.     Mental Status: She is alert and oriented to person, place, and time.      UC Treatments / Results  Labs (all labs ordered are listed, but only abnormal results are displayed) Labs Reviewed  SARS CORONAVIRUS 2 BY RT PCR    EKG   Radiology DG Chest 2 View Result Date: 07/10/2023 CLINICAL DATA:  Cough. EXAM: CHEST - 2 VIEW COMPARISON:  03/12/2023. FINDINGS: Stable cardiomediastinal silhouette. Aortic atherosclerosis. No focal consolidation, pleural effusion, or pneumothorax. Stable dextrocurvature of the thoracic spine. No acute osseous abnormality. IMPRESSION: No acute cardiopulmonary findings. Electronically Signed   By: Mannie Seek M.D.   On: 07/10/2023 09:23    Procedures Procedures (including critical care time)  Medications Ordered in UC Medications - No data to display  Initial Impression / Assessment and Plan / UC Course  I have reviewed the triage vital signs and the nursing notes.  Pertinent labs & imaging results that were available during my care of the patient were reviewed by me and considered in my medical decision making (see chart for details).   Patient is a pleasant, nontoxic-appearing 68 year old female presenting for evaluation of lower respiratory symptoms as outlined HPI above.  In the exam room she is able to speak in full sentence without dyspnea or tachypnea.  She has a room air oxygen saturation of 98% but she has an elevated temp  of 99.  Her lungs are clear to auscultation in all fields.  She denies any upper respiratory symptoms though she does have inflamed nasal mucosa as well as erythema and injection to  her posterior pharynx with clear postnasal drip.  Differential diagnose include COVID, influenza, pneumonia, viral respiratory illness.  I will order a COVID PCR but will not test for influenza given that she is on day 4 of symptoms.  I will also order chest x-ray to evaluate for any acute cardiopulmonary pathology.  Chest x-ray independently reviewed and evaluated by me.  Impression: Patchy haziness present in the right hilar area but the remainder the lungs of [place] are well aerated.  Cardiomediastinal silhouette appears normal.  Mild elevation of the right hemidiaphragm also present.  When compared to chest x-ray from 03/12/2023 they appear similar with the exception of the patchy haziness in the right hilar region.  Radiology overread is pending. Radiology impression states no active cardiopulmonary disease.  COVID PCR is negative.  I will discharge patient on the diagnosis of bronchitis and start her on doxycycline  100 mg twice daily for 7 days.  Atrovent nasal spray for the nasal congestion and Tessalon Perles up to [facility] give cough syrup for cough and congestion.  Additionally, I will prescribe an albuterol inhaler and a spacer and she can take 1 to 2 puffs every 4-6 hours needed for shortness breath or wheezing.  Final Clinical Impressions(s) / UC Diagnoses   Final diagnoses:  Acute cough  Acute bronchitis, unspecified organism     Discharge Instructions      Your chest x-ray did not show any evidence of pneumonia.  I am going to treat you for bronchitis.  Take the doxycycline  twice daily with food for 7 days for treatment of bronchitis.  Use the albuterol inhaler, with the spacer, and take 1 to 2 puffs every 4-6 hours as needed for shortness of breath or wheezing.  Use over-the-counter Tylenol  and/or ibuprofen Corda pack instructions as needed for any fever or pain.  Use the Atrovent nasal spray, 2 squirts in each nostril every 6 hours, as needed for runny nose and postnasal  drip.  Use the Tessalon Perles every 8 hours during the day.  Take them with a small sip of water .  They may give you some numbness to the base of your tongue or a metallic taste in your mouth, this is normal.  Use the Promethazine  DM cough syrup at bedtime for cough and congestion.  It will make you drowsy so do not take it during the day.  Return for reevaluation or see your primary care provider for any new or worsening symptoms.      ED Prescriptions     Medication Sig Dispense Auth. Provider   benzonatate (TESSALON) 100 MG capsule Take 2 capsules (200 mg total) by mouth every 8 (eight) hours. 21 capsule Kent Pear, NP   doxycycline  (VIBRAMYCIN ) 100 MG capsule Take 1 capsule (100 mg total) by mouth 2 (two) times daily for 7 days. 14 capsule Kent Pear, NP   ipratropium (ATROVENT) 0.06 % nasal spray Place 2 sprays into both nostrils 4 (four) times daily. 15 mL Kent Pear, NP   promethazine -dextromethorphan (PROMETHAZINE -DM) 6.25-15 MG/5ML syrup Take 5 mLs by mouth 4 (four) times daily as needed. 118 mL Kent Pear, NP   Spacer/Aero-Holding Chambers (AEROCHAMBER MV) inhaler Use as instructed 1 each Kent Pear, NP   albuterol (VENTOLIN HFA) 108 (90 Base) MCG/ACT inhaler Inhale 2 puffs into the lungs every  4 (four) hours as needed. 18 g Kent Pear, NP      PDMP not reviewed this encounter.   Kent Pear, NP 07/10/23 [phone]

## 2023-07-31 ENCOUNTER — Ambulatory Visit: Admission: EM | Admit: 2023-07-31 | Discharge: 2023-07-31 | Disposition: A | Discharge status: Home / Self Care

## 2023-07-31 DIAGNOSIS — R053 Chronic cough: Secondary | ICD-10-CM

## 2023-07-31 MED ORDER — PREDNISONE 20 MG PO TABS
40.0000 mg | ORAL_TABLET | Freq: Every day | ORAL | 0 refills | Status: AC
Start: 2023-07-31 — End: 2023-08-05

## 2023-07-31 MED ORDER — PROMETHAZINE-DM 6.25-15 MG/5ML PO SYRP
5.0000 mL | ORAL_SOLUTION | Freq: Four times a day (QID) | ORAL | 0 refills | Status: AC | PRN
Start: 2023-07-31 — End: ?

## 2023-07-31 NOTE — ED Triage Notes (Signed)
 Cough, hoarseness x 3 days. Taking nyquil and mucinex.

## 2023-07-31 NOTE — ED Provider Notes (Signed)
 [facility]  Note:  This document was prepared using Conservation officer, historic buildings and may include unintentional dictation errors.  MRN: [id number] DOB: 02/14/1955  Subjective:   Katrina Crawford is a 68 y.o. female presenting for persistent cough, hoarseness x 3 days.  Patient reports that cough is usually worse after periods of activity or working outside in the heat.  She states that in June she was diagnosed with bronchitis and although symptoms improved she believes that cough may still be lingering from bronchitis.  Patient reports she has been taking Mucinex and NyQuil with minimal improvement.  Patient reports that when prescribed promethazine  dextromethorphan cough syrup had seem to help her cough more at night.  Patient denies fever, chest pain, shortness of breath, weakness, dizziness.  No current facility-administered medications for this encounter.  Current Outpatient Medications:    cyanocobalamin 2000 MCG tablet, Take 2,000 mcg by mouth daily., Disp: , Rfl:    hydrochlorothiazide  (HYDRODIURIL ) 25 MG tablet, Take 1 tablet (25 mg total) by mouth daily., Disp: 90 tablet, Rfl: 1   L-Lysine  1000 MG TABS, Take 2 tablets (2,000 mg total) by mouth daily. otc, Disp: 100 tablet, Rfl: 3   losartan  (COZAAR ) 50 MG tablet, Take 1 tablet (50 mg total) by mouth daily., Disp: 90 tablet, Rfl: 1   predniSONE  (DELTASONE ) 20 MG tablet, Take 2 tablets (40 mg total) by mouth daily for 5 days., Disp: 10 tablet, Rfl: 0   rosuvastatin  (CRESTOR ) 10 MG tablet, Take 1 tablet (10 mg total) by mouth daily., Disp: 90 tablet, Rfl: 1   albuterol  (VENTOLIN  HFA) 108 (90 Base) MCG/ACT inhaler, Inhale 2 puffs into the lungs every 4 (four) hours as needed., Disp: 18 g, Rfl: 0   benzonatate  (TESSALON ) 100 MG capsule, Take 2 capsules (200 mg total) by mouth every 8 (eight) hours., Disp: 21 capsule, Rfl: 0   Calcium  Carb-Cholecalciferol (CALCIUM  600+D) 600-20 MG-MCG TABS, Take 2 tablets by mouth daily., Disp:  , Rfl:    conjugated estrogens  (PREMARIN ) vaginal cream, Place 1 Applicatorful vaginally daily. Estrogen Cream Instruction Discard applicator Apply pea sized amount to tip of finger to urethra before bed. Wash hands well after application. Use Monday, Wednesday and Friday, Disp: 42.5 g, Rfl: 12   ipratropium (ATROVENT ) 0.06 % nasal spray, Place 2 sprays into both nostrils 4 (four) times daily., Disp: 15 mL, Rfl: 12   nystatin  cream (MYCOSTATIN ), Apply topically 2 (two) times daily., Disp: 30 g, Rfl: 0   promethazine -dextromethorphan (PROMETHAZINE -DM) 6.25-15 MG/5ML syrup, Take 5 mLs by mouth 4 (four) times daily as needed., Disp: 240 mL, Rfl: 0   Spacer/Aero-Holding Chambers (AEROCHAMBER MV) inhaler, Use as instructed, Disp: 1 each, Rfl: 2   valACYclovir  (VALTREX ) 1000 MG tablet, Take 1 tablet (1,000 mg total) by mouth 2 (two) times daily. As needed for breakout, Disp: 20 tablet, Rfl: 1   Allergies  Allergen Reactions   Codeine Nausea Only   Penicillins Rash   Sulfa Antibiotics Rash    Past Medical History:  Diagnosis Date   Allergy 01/22/1998   Arthritis    knees, lower back   Hyperlipidemia    Hypertension    Orthodontics    invisalign     Past Surgical History:  Procedure Laterality Date   APPENDECTOMY     APPLICATION OF INTRAOPERATIVE CT SCAN N/A 10/15/2021   Procedure: APPLICATION OF INTRAOPERATIVE CT SCAN;  Surgeon: Clois Fret, MD;  Location: [facility];  Service: Neurosurgery;  Laterality: N/A;   BUNIONECTOMY Right 02/06/2022  Procedure: BUNIONECTOMY;  Surgeon: Ashley Soulier, DPM;  Location: [facility];  Service: Podiatry;  Laterality: Right;   CAPSULOTOMY METATARSOPHALANGEAL Right 02/06/2022   Procedure: CAPSULOTOMY METATARSOPHALANGEAL;  Surgeon: Ashley Soulier, DPM;  Location: [facility];  Service: Podiatry;  Laterality: Right;   COLONOSCOPY  08/2013   cleared for 5 yrs- Dr Jinny   COLONOSCOPY WITH PROPOFOL  N/A 09/04/2018   Procedure: COLONOSCOPY  WITH PROPOFOL ;  Surgeon: Jinny Carmine, MD;  Location: [facility];  Service: Endoscopy;  Laterality: N/A;   JOINT REPLACEMENT  11/18/2017   REPLACEMENT TOTAL KNEE Left 09/17/2017   TRANSFORAMINAL LUMBAR INTERBODY FUSION (TLIF) WITH PEDICLE SCREW FIXATION 2 LEVEL N/A 10/15/2021   Procedure: OPEN L4-S1 TRANSFORAMINAL LUMBAR INTERBODY FUSION (TLIF);  Surgeon: Clois Fret, MD;  Location: [facility];  Service: Neurosurgery;  Laterality: N/A;   VAGINAL HYSTERECTOMY      Family History  Problem Relation Age of Onset   Cancer Mother    Hypertension Father    Breast cancer Sister 36   ADD / ADHD Sister    Cancer Sister    Cancer Sister    Cancer Brother    Cancer Brother     Social History   Tobacco Use   Smoking status: Never   Smokeless tobacco: Never  Vaping Use   Vaping status: Never Used  Substance Use Topics   Alcohol use: Yes    Alcohol/week: 18.0 standard drinks of alcohol    Types: 18 Cans of beer per week   Drug use: No    ROS Refer to HPI for ROS details.  Objective:   Vitals: BP (!) 156/84   Pulse 80   Temp 98.4 F (36.9 C) (Oral)   Resp 16   SpO2 99%   Physical Exam Vitals and nursing note reviewed.  Constitutional:      General: She is not in acute distress.    Appearance: Normal appearance. She is well-developed. She is not ill-appearing or toxic-appearing.  HENT:     Head: Normocephalic and atraumatic.  Cardiovascular:     Rate and Rhythm: Normal rate and regular rhythm.     Heart sounds: Normal heart sounds. No murmur heard. Pulmonary:     Effort: Pulmonary effort is normal. No respiratory distress.     Breath sounds: Normal breath sounds. No stridor. No wheezing, rhonchi or rales.  Chest:     Chest wall: No tenderness.  Skin:    General: Skin is warm and dry.  Neurological:     General: No focal deficit present.     Mental Status: She is alert and oriented to person, place, and time.  Psychiatric:        Mood and Affect: Mood  normal.        Behavior: Behavior normal.     Procedures  No results found for this or any previous visit (from the past 24 hours).  Assessment and Plan :     Discharge Instructions       1. Persistent dry cough (Primary) - promethazine -dextromethorphan (PROMETHAZINE -DM) 6.25-15 MG/5ML syrup; Take 5 mLs by mouth 4 (four) times daily as needed.  Dispense: 240 mL; Refill: 0 - predniSONE  (DELTASONE ) 20 MG tablet; Take 2 tablets (40 mg total) by mouth daily for 5 days.  Dispense: 10 tablet; Refill: 0 - Continue using previously prescribed ipratropium nasal spray twice daily as directed for postnasal drip and cough. - Continue using albuterol  inhaler as needed during times of increased coughing to decrease airway inflammation and improve  cough. -Continue to monitor symptoms for any change in severity if there is any escalation of current symptoms or development of new symptoms follow-up in ER for further evaluation and management.      Ellene Bloodsaw B Linnette Panella   Sharlynn Seckinger, [place], [place] 07/31/23 [phone]

## 2023-07-31 NOTE — Discharge Instructions (Signed)
  1. Persistent dry cough (Primary) - promethazine -dextromethorphan (PROMETHAZINE -DM) 6.25-15 MG/5ML syrup; Take 5 mLs by mouth 4 (four) times daily as needed.  Dispense: 240 mL; Refill: 0 - predniSONE  (DELTASONE ) 20 MG tablet; Take 2 tablets (40 mg total) by mouth daily for 5 days.  Dispense: 10 tablet; Refill: 0 - Continue using previously prescribed ipratropium nasal spray twice daily as directed for postnasal drip and cough. - Continue using albuterol  inhaler as needed during times of increased coughing to decrease airway inflammation and improve cough. -Continue to monitor symptoms for any change in severity if there is any escalation of current symptoms or development of new symptoms follow-up in ER for further evaluation and management.

## 2023-08-17 ENCOUNTER — Other Ambulatory Visit: Payer: Self-pay | Admitting: Urology

## 2023-08-17 DIAGNOSIS — N952 Postmenopausal atrophic vaginitis: Secondary | ICD-10-CM

## 2023-10-01 ENCOUNTER — Other Ambulatory Visit: Payer: Self-pay | Admitting: Internal Medicine

## 2023-10-01 DIAGNOSIS — Z1231 Encounter for screening mammogram for malignant neoplasm of breast: Secondary | ICD-10-CM

## 2023-10-09 ENCOUNTER — Telehealth: Payer: Self-pay

## 2023-10-09 NOTE — Telephone Encounter (Signed)
 Pt requesting a call back to schedule colonospy

## 2023-10-10 ENCOUNTER — Other Ambulatory Visit: Payer: Self-pay

## 2023-10-10 ENCOUNTER — Telehealth: Payer: Self-pay

## 2023-10-10 DIAGNOSIS — Z800 Family history of malignant neoplasm of digestive organs: Secondary | ICD-10-CM

## 2023-10-10 DIAGNOSIS — Z1211 Encounter for screening for malignant neoplasm of colon: Secondary | ICD-10-CM

## 2023-10-10 MED ORDER — NA SULFATE-K SULFATE-MG SULF 17.5-3.13-1.6 GM/177ML PO SOLN: 1.0000 | Freq: Once | ORAL | 0 refills | Status: AC

## 2023-10-10 NOTE — Telephone Encounter (Signed)
 Gastroenterology Pre-Procedure Review  Request Date: 01/09/24 Requesting Physician: Dr. Melany  PATIENT REVIEW QUESTIONS: The patient responded to the following health history questions as indicated:    1. Are you having any GI issues? no 2. Do you have a personal history of Polyps? no 3. Do you have a family history of Colon Cancer or Polyps? yes (mother rectal cancer, maternal grandmother colon cancer) 4. Diabetes Mellitus? no 5. Joint replacements in the past 12 months?no 6. Major health problems in the past 3 months?no 7. Any artificial heart valves, MVP, or defibrillator?no    MEDICATIONS & ALLERGIES:    Patient reports the following regarding taking any anticoagulation/antiplatelet therapy:   Plavix, Coumadin, Eliquis, Xarelto, Lovenox , Pradaxa, Brilinta, or Effient? no Aspirin? no  Patient confirms/reports the following medications:  Current Outpatient Medications  Medication Sig Dispense Refill   albuterol  (VENTOLIN  HFA) 108 (90 Base) MCG/ACT inhaler Inhale 2 puffs into the lungs every 4 (four) hours as needed. 18 g 0   benzonatate  (TESSALON ) 100 MG capsule Take 2 capsules (200 mg total) by mouth every 8 (eight) hours. 21 capsule 0   Calcium  Carb-Cholecalciferol (CALCIUM  600+D) 600-20 MG-MCG TABS Take 2 tablets by mouth daily.     conjugated estrogens  (PREMARIN ) vaginal cream Place 1 Applicatorful vaginally daily. Estrogen Cream Instruction Discard applicator Apply pea sized amount to tip of finger to urethra before bed. Wash hands well after application. Use Monday, Wednesday and Friday 42.5 g 12   cyanocobalamin 2000 MCG tablet Take 2,000 mcg by mouth daily.     hydrochlorothiazide  (HYDRODIURIL ) 25 MG tablet Take 1 tablet (25 mg total) by mouth daily. 90 tablet 1   ipratropium (ATROVENT ) 0.06 % nasal spray Place 2 sprays into both nostrils 4 (four) times daily. 15 mL 12   L-Lysine  1000 MG TABS Take 2 tablets (2,000 mg total) by mouth daily. otc 100 tablet 3   losartan   (COZAAR ) 50 MG tablet Take 1 tablet (50 mg total) by mouth daily. 90 tablet 1   Na Sulfate-K Sulfate-Mg Sulfate concentrate (SUPREP) 17.5-3.13-1.6 GM/177ML SOLN Take 1 kit (354 mLs total) by mouth once for 1 dose. 354 mL 0   nystatin  cream (MYCOSTATIN ) Apply topically 2 (two) times daily. 30 g 0   promethazine -dextromethorphan (PROMETHAZINE -DM) 6.25-15 MG/5ML syrup Take 5 mLs by mouth 4 (four) times daily as needed. 240 mL 0   rosuvastatin  (CRESTOR ) 10 MG tablet Take 1 tablet (10 mg total) by mouth daily. 90 tablet 1   Spacer/Aero-Holding Chambers (AEROCHAMBER MV) inhaler Use as instructed 1 each 2   valACYclovir  (VALTREX ) 1000 MG tablet Take 1 tablet (1,000 mg total) by mouth 2 (two) times daily. As needed for breakout 20 tablet 1   No current facility-administered medications for this visit.    Patient confirms/reports the following allergies:  Allergies  Allergen Reactions   Codeine Nausea Only   Penicillins Rash   Sulfa Antibiotics Rash    No orders of the defined types were placed in this encounter.   AUTHORIZATION INFORMATION Primary Insurance: 1D#: Group #:  Secondary Insurance: 1D#: Group #:  SCHEDULE INFORMATION: Date: 01/09/24 Time: Location: [facility]

## 2023-10-29 ENCOUNTER — Ambulatory Visit
Admission: RE | Admit: 2023-10-29 | Discharge: 2023-10-29 | Disposition: A | Discharge status: Home / Self Care | Source: Ambulatory Visit | Attending: Internal Medicine | Admitting: Internal Medicine

## 2023-10-29 DIAGNOSIS — Z1231 Encounter for screening mammogram for malignant neoplasm of breast: Secondary | ICD-10-CM | POA: Diagnosis present

## 2024-01-06 ENCOUNTER — Encounter: Payer: Self-pay | Admitting: Gastroenterology

## 2024-01-06 ENCOUNTER — Other Ambulatory Visit: Payer: Self-pay

## 2024-01-07 ENCOUNTER — Encounter: Payer: Self-pay | Admitting: Gastroenterology

## 2024-01-07 NOTE — Anesthesia Preprocedure Evaluation (Signed)
 Anesthesia Evaluation    Airway        Dental   Pulmonary           Cardiovascular hypertension,      Neuro/Psych    GI/Hepatic   Endo/Other    Renal/GU      Musculoskeletal   Abdominal   Peds  Hematology   Anesthesia Other Findings Hypertension Hyperlipidemia Arthritis Orthodontics Allergy Persistent dry cough    Reproductive/Obstetrics                              Anesthesia Physical Anesthesia Plan Anesthesia Quick Evaluation

## 2024-03-19 ENCOUNTER — Encounter: Payer: Self-pay | Admitting: Anesthesiology

## 2024-03-19 ENCOUNTER — Ambulatory Visit: Admission: RE | Admit: 2024-03-19 | Admitting: Gastroenterology

## 2024-03-19 ENCOUNTER — Encounter: Admission: RE | Payer: Self-pay

## 2024-03-19 SURGERY — COLONOSCOPY
Anesthesia: Choice
